# Patient Record
Sex: Male | Born: 1965
Health system: Southern US, Community
[De-identification: ages and names within clinical notes are randomized; demographics above are authoritative.]

## PROBLEM LIST (undated history)

## (undated) DIAGNOSIS — Z823 Family history of stroke: Secondary | ICD-10-CM

## (undated) DIAGNOSIS — I10 Essential (primary) hypertension: Secondary | ICD-10-CM

## (undated) DIAGNOSIS — C61 Malignant neoplasm of prostate: Secondary | ICD-10-CM

## (undated) DIAGNOSIS — R413 Other amnesia: Secondary | ICD-10-CM

## (undated) DIAGNOSIS — R519 Headache, unspecified: Secondary | ICD-10-CM

## (undated) DIAGNOSIS — K7689 Other specified diseases of liver: Secondary | ICD-10-CM

## (undated) DIAGNOSIS — I714 Abdominal aortic aneurysm, without rupture, unspecified: Secondary | ICD-10-CM

## (undated) DIAGNOSIS — M19019 Primary osteoarthritis, unspecified shoulder: Secondary | ICD-10-CM

## (undated) DIAGNOSIS — I493 Ventricular premature depolarization: Secondary | ICD-10-CM

## (undated) DIAGNOSIS — E119 Type 2 diabetes mellitus without complications: Secondary | ICD-10-CM

## (undated) DIAGNOSIS — E1165 Type 2 diabetes mellitus with hyperglycemia: Secondary | ICD-10-CM

## (undated) DIAGNOSIS — G479 Sleep disorder, unspecified: Secondary | ICD-10-CM

## (undated) DIAGNOSIS — N529 Male erectile dysfunction, unspecified: Secondary | ICD-10-CM

## (undated) DIAGNOSIS — R001 Bradycardia, unspecified: Secondary | ICD-10-CM

## (undated) DIAGNOSIS — F419 Anxiety disorder, unspecified: Secondary | ICD-10-CM

## (undated) DIAGNOSIS — E785 Hyperlipidemia, unspecified: Secondary | ICD-10-CM

## (undated) DIAGNOSIS — Z8673 Personal history of transient ischemic attack (TIA), and cerebral infarction without residual deficits: Secondary | ICD-10-CM

## (undated) DIAGNOSIS — G459 Transient cerebral ischemic attack, unspecified: Secondary | ICD-10-CM

## (undated) DIAGNOSIS — M1990 Unspecified osteoarthritis, unspecified site: Secondary | ICD-10-CM

## (undated) DIAGNOSIS — R51 Headache: Secondary | ICD-10-CM

## (undated) HISTORY — DX: Anxiety disorder, unspecified: F41.9

## (undated) HISTORY — DX: Essential (primary) hypertension: I10

## (undated) HISTORY — DX: Abdominal aortic aneurysm, without rupture: I71.4

## (undated) HISTORY — DX: Hyperlipidemia, unspecified: E78.5

## (undated) HISTORY — DX: Abdominal aortic aneurysm, without rupture, unspecified: I71.40

## (undated) HISTORY — DX: Family history of stroke: Z82.3

## (undated) HISTORY — DX: Transient cerebral ischemic attack, unspecified: G45.9

## (undated) HISTORY — DX: Headache, unspecified: R51.9

## (undated) HISTORY — PX: ROTATOR CUFF REPAIR: SHX139

## (undated) HISTORY — PX: MASS EXCISION: SHX2000

## (undated) HISTORY — DX: Ventricular premature depolarization: I49.3

## (undated) HISTORY — DX: Type 2 diabetes mellitus with hyperglycemia: E11.65

## (undated) HISTORY — DX: Type 2 diabetes mellitus without complications: E11.9

## (undated) HISTORY — DX: Bradycardia, unspecified: R00.1

## (undated) HISTORY — DX: Other amnesia: R41.3

## (undated) HISTORY — PX: HERNIA REPAIR: SHX51

## (undated) HISTORY — DX: Primary osteoarthritis, unspecified shoulder: M19.019

## (undated) HISTORY — DX: Personal history of transient ischemic attack (TIA), and cerebral infarction without residual deficits: Z86.73

## (undated) HISTORY — DX: Headache: R51

## (undated) HISTORY — DX: Male erectile dysfunction, unspecified: N52.9

## (undated) HISTORY — DX: Other specified diseases of liver: K76.89

## (undated) HISTORY — PX: SURGERY SCROTAL / TESTICULAR: SUR1316

---

## 1998-12-31 ENCOUNTER — Emergency Department (HOSPITAL_COMMUNITY): Admission: EM | Admit: 1998-12-31 | Discharge: 1998-12-31 | Payer: Self-pay

## 2001-08-10 ENCOUNTER — Encounter: Payer: Self-pay | Admitting: Emergency Medicine

## 2001-08-10 ENCOUNTER — Emergency Department (HOSPITAL_COMMUNITY): Admission: EM | Admit: 2001-08-10 | Discharge: 2001-08-10 | Payer: Self-pay | Admitting: Emergency Medicine

## 2002-02-07 ENCOUNTER — Ambulatory Visit (HOSPITAL_COMMUNITY): Admission: RE | Admit: 2002-02-07 | Discharge: 2002-02-07 | Payer: Self-pay | Admitting: General Surgery

## 2002-10-22 ENCOUNTER — Inpatient Hospital Stay (HOSPITAL_COMMUNITY): Admission: EM | Admit: 2002-10-22 | Discharge: 2002-10-24 | Payer: Self-pay | Admitting: Emergency Medicine

## 2004-05-31 ENCOUNTER — Emergency Department (HOSPITAL_COMMUNITY): Admission: EM | Admit: 2004-05-31 | Discharge: 2004-05-31 | Payer: Self-pay | Admitting: Emergency Medicine

## 2004-05-31 ENCOUNTER — Emergency Department (HOSPITAL_COMMUNITY): Admission: EM | Admit: 2004-05-31 | Discharge: 2004-06-02 | Payer: Self-pay | Admitting: Emergency Medicine

## 2005-04-23 ENCOUNTER — Emergency Department (HOSPITAL_COMMUNITY): Admission: EM | Admit: 2005-04-23 | Discharge: 2005-04-23 | Payer: Self-pay | Admitting: Emergency Medicine

## 2005-04-24 ENCOUNTER — Inpatient Hospital Stay (HOSPITAL_COMMUNITY): Admission: EM | Admit: 2005-04-24 | Discharge: 2005-04-26 | Payer: Self-pay | Admitting: Emergency Medicine

## 2005-04-25 ENCOUNTER — Encounter (INDEPENDENT_AMBULATORY_CARE_PROVIDER_SITE_OTHER): Payer: Self-pay | Admitting: Cardiology

## 2009-03-17 ENCOUNTER — Emergency Department (HOSPITAL_COMMUNITY): Admission: EM | Admit: 2009-03-17 | Discharge: 2009-03-17 | Payer: Self-pay | Admitting: Family Medicine

## 2010-11-11 NOTE — Op Note (Signed)
Jose Bishop, LEICHTER                         ACCOUNT NO.:  [id number]   MEDICAL RECORD NO.:  [id number]                   PATIENT TYPE:  AMB   LOCATION:  DAY                                  FACILITY:  [facility]   PHYSICIAN:  Timothy E. Earlene Plater, M.D.              DATE OF BIRTH:  [birth year]   DATE OF PROCEDURE:  02/07/2002  DATE OF DISCHARGE:  02/07/2002                                 OPERATIVE REPORT   PREOPERATIVE DIAGNOSIS:  Umbilical versus primary ventral hernia.   POSTOPERATIVE DIAGNOSIS:  Primary ventral hernia.   PROCEDURE:  Exploration of abdominal wall and repair of hernia incarcerated  with mesh.   SURGEON:  Timothy E. Earlene Plater, M.D.   ANESTHESIA:  General.   INDICATIONS:  The patient is 55, otherwise healthy, athletic and a very hard  worker who has developed a painful, apparently incarcerated hernia at and  just above the umbilicus.  After careful discussion, he wishes to proceed  with repair.   DESCRIPTION OF PROCEDURE:  The patient was brought to the operating room,  identified, permit signed, site marked, evaluated by anesthesia, taken to  the operating room and placed supine, general endotracheal anesthesia  administered.  The umbilicus was palpated, prepped and draped in the usual  fashion.  The hernia appeared to be above the umbilicus.  An incision was  made above the umbilicus and carried down through the deep subcutaneous  fatty tissue and the fascia identified.  The umbilicus was carefully  explored and though there was a tiny defect within the umbilicus that was  not the herniation.  Approximately 3 cm above the umbilicus was an  incarcerated primary ventral hernia.  The hernia contents were dissected out  from the subcutaneous tissue.  The hernia defect was widened horizontally  and the contents could then be reduced.  There was a hernia sac with a knot  of omental tissue.  The sac was opened, and the contents were then easily  reduced back into the  abdominal cavity.  The defect was then closed with  interrupted 0 sutures of  Prolene and the knots buried.  The tiny defect at the base of the umbilicus  was closed with a single suture of Prolene.  Fishing mesh was cut and  fashioned to fit this entire area and tacked down to the normal fascia with  2-0 PDS suture.  With the counts correct, and the wound dry it was closed in  layers with Monocryl.  Steri-Strips applied.  A small burn was accidentally  made no the skin just to the right and above the umbilicus.  This area was  button-holed out sharply and closed subcuticularly with a 3-0 Monocryl  suture.  Steri-Strips applied.  Dry sterile dressing applied.  He tolerated  it well and was removed to the recovery room in good condition.   Written and verbal instructions were given to him and his wife  including  Percocet 5 mg #36, and he will be seen and followed as an outpatient.                                               Timothy E. Earlene Plater, M.D.    TED/MEDQ  D:  02/07/2002  T:  02/09/2002  Job:  [id number]

## 2010-11-11 NOTE — Discharge Summary (Signed)
NAME:  Jose Bishop, Jose Bishop                          ACCOUNT NO.:  [id number]   MEDICAL RECORD NO.:  [id number]                   PATIENT TYPE:  INP   LOCATION:  5502                                 FACILITY:  [facility]   PHYSICIAN:  Lilyan Punt. Sydnee Levans, M.D.             DATE OF BIRTH:  Apr 28, 1966   DATE OF ADMISSION:  10/22/2002  DATE OF DISCHARGE:  10/24/2002                                 DISCHARGE SUMMARY   \   PRIMARY CARE PHYSICIAN:  Sigmund Hazel, M.D.   CONSULTING PHYSICIAN:  Otolaryngology, Dr. Kela Millin.   DISCHARGE DIAGNOSES:  1. Bilateral severe tonsillar swelling with peritonsillar cellulitis but no     evidence of abscess.  2. Reactive lymphadenitis.   DISCHARGE MEDICATIONS:  1. Ceftin 500 mg one b.i.d. for additional 12 days.  2. Decadron 10 mg q.8h. for an additional six doses.  3. Percocet 5/500 1-2 q.4-6h. p.r.n. pain.  4. Chloraseptic spray and lozenges p.r.n. pain.  5. Advil 600 mg q.6h. p.r.n.   FOLLOWUP:  Follow up with Dr. Hyacinth Meeker in 7-10 days [id number]) and with Dr.  Arletha Grippe, Otolaryngology as needed because of sudden worsening of throat  swelling or any breathing difficulty.   ACTIVITY:  No restrictions.  May return to work next Monday.   DIET:  No restrictions.  He is instructed on fluid intake, soft foods and  gradually advancing  diet as tolerated.   PAIN MANAGEMENT:  Current medications as above.   HOSPITAL COURSE:  This is a 45 year old male who presented to Dr. Rondel Baton  office with sore throat.  This was his first visit at that office.  Prior to  that, the patient was seen at Urgent Care facility diagnosed with viral  laryngitis and treated with Tri-pack.  He did not respond to therapy and saw  Dr. Hyacinth Meeker.  Dr. Hyacinth Meeker noted marked tonsillar swelling and tonsillar  exudate and marked lymphadenopathy and referred the patient for prompt CT of  the neck which revealed a concern about peritonsillar abscess.  In turn, he  went from [facility]  directly to the otolaryngologist's office.  CT and patient were examined closely.  There was no evidence of abscess and  no surgical procedure was deemed necessary.  The patient was in turn  referred for inpatient management.   After hospitalization, the patient received aggressive IV fluid, IV Decadron  and IV Rocephin and was serially assessed.  Dr. Arletha Grippe followed as well.  Cultures for group A strep were negative.  Mono-spot was negative.  CBC  was unremarkable.  The patient had stable hospital course and was discharged  on 4/30 in stable condition tolerating diet without any oral or airway  obstruction though he did have significant persisting jugulodigastric  lymphadenopathy in the left submandibular and left anterior cervical  regions.  Lilyan Punt Sydnee Levans, M.D.    KCS/MEDQ  D:  10/24/2002  T:  10/24/2002  Job:  [id number]

## 2010-11-11 NOTE — Consult Note (Signed)
NAMEJAKEN, FREGIA                ACCOUNT NO.:  [id number]   MEDICAL RECORD NO.:  [id number]          PATIENT TYPE:  INP   LOCATION:  6702                         FACILITY:  [facility]   PHYSICIAN:  Pramod P. Pearlean Brownie, MD    DATE OF BIRTH:  1966/02/11   DATE OF CONSULTATION:  DATE OF DISCHARGE:                                   CONSULTATION   REFERRING PHYSICIAN:  Jackie Plum, M.D.   REASON FOR REFERRAL:  TIAs.   HISTORY OF PRESENT ILLNESS:  Mr. Eddleman is a 45 year old African-American male  who developed symptoms of progressive left leg paresthesias, followed five  minutes later by left hand shaking as well as weakness, numbness and some  speech difficulties with word-finding problems.  His symptoms lasted a  couple of hours.  They were accompanied by headache, which he describes as  left hemicranial throbbing, sharp, moderate to severe intensity, not  accompanied by nausea or vomiting, photophobia or visual symptoms.  No  history of migraine headaches.  Admits to intermittent headaches which are  not very disabling and analgesics seem to help.  He has no known prior  history of definite TIAs or stroke, though he did have an episode of right  upper extremity weakness and numbness in December 2005.  At that time an MRI  scan was done at [facility], which was normal.  He was  subsequently found by his primary physician, Vikki Ports, M.D., to  have some right leg weakness as well; however, no source of etiology was  found.  MRI of the C-spine was also subsequently done, which was  unremarkable, though the actual results are not available for my opinion  today.  The patient has no known vascular risk factors except hyperlipidemia  and family history of stroke from his father.   PAST MEDICAL HISTORY:  Otherwise unremarkable.   PAST SURGICAL HISTORY:  Hernia surgery, testicular fixation surgery.   MEDICATIONS AT HOME:  Ibuprofen.   MEDICATION ALLERGIES:  None.   SOCIAL HISTORY:  The patient works as a Naval architect.  He does not smoke.  He drinks a few beers.  He denies doing drugs.   REVIEW OF SYSTEMS:  Not significant for recent fever, cough, chest pain,  diarrhea, shortness of breath.   PHYSICAL EXAMINATION:  GENERAL:  A well-built young African-American male  who is not in distress.  VITAL SIGNS:  Afebrile, pulse is 72 per minute and regular, respiratory 16  per minute; distal pulses are well felt.  HEENT:  Head is nontraumatic.  ENT exam unremarkable.  NECK:  Supple without bruit.  CARDIAC:  No murmur or gallop.  CHEST:  Lungs clear to auscultation.  NEUROLOGIC:  The patient is pleasant, awake, alert, cooperative.  There is  no aphasia, apraxia or dysarthria.  He does complain of subjective memory  loss but he seems to have well-preserved attention and short-term memory.  Detailed cognitive testing was not done today.  Pupils are equal, reactive  to light and accommodation.  Visual acuity and fields adequate.  Face is  symmetric, bilateral movements are normal, tongue  is midline.  Motor system  exam reveals no upper extremity drift.  Fine finger movements are symmetric  and does nor orbit right over left upper extremity.  He has some subjective  give-away weakness while testing for left grip strength.  The rest of the  strength testing was symmetric and equal.  He is slightly unsteady when  standing on the left foot not supported and while trying to walk tandem can  walk fairly well.  Deep tendon reflexes are 2+, symmetric.  Plantars are  downgoing.  There is subjective decreased sensation on the left side of the  body, but this is not really consistent.   DATA REVIEWED:  MRI scan of the brain done yesterday is normal.  MRA of the  brain reveals no significant stenosis.  Carotid artery film shows no  stenosis.  Previous brain MRI scan from December 2005:  Results are normal.   IMPRESSION:  A 45 year old male with no significant vascular  risk factors  except hyperlipidemia and a family history of stroke, presents with  progressive paresthesias and weakness of the left body as well as some  speech difficulties, which are ill-defined and of unclear etiology.  Transient ischemic attack or stroke would be unlikely given the progression  of the symptoms.  Complicated migraine is certainly a possibility given the  subjective headache, although he has no prior history of migraines.  Focal  seizures would also be a possibility given the fact that he had some  twitchings of his left arm.  He had some subjective complaints of short-term  memory loss which may need further evaluation as an outpatient.   PLAN:  I would recommend checking an EEG.  He may have continuing outpatient  follow-up with Dr. Thad Ranger for further workup if necessary.  I had a long  discussion with the patient and his mother and answered questions.           ______________________________  Sunny Schlein. Pearlean Brownie, MD     PPS/MEDQ  D:  04/25/2005  T:  04/26/2005  Job:  [id number]

## 2010-11-11 NOTE — Discharge Summary (Signed)
Jose Bishop, Jose Bishop                ACCOUNT NO.:  [id number]   MEDICAL RECORD NO.:  [id number]          PATIENT TYPE:  INP   LOCATION:  6702                         FACILITY:  [facility]   PHYSICIAN:  Jackie Plum, M.D.DATE OF BIRTH:  1965/12/11   DATE OF ADMISSION:  04/24/2005  DATE OF DISCHARGE:                           DISCHARGE SUMMARY - REFERRING   DISCHARGE DIAGNOSES:  1.  Transient left body paresthesias and weakness of unknown etiology.      1.  CT scan of the head was negative.  MRI and MRA of the brain was also          unremarkable.  EEG as well as antiphospholipid antibodies ANA and          ESR pending, will need to be checked and ANA pending.  Patient's ESR          was normal at 8.  HIV testing as nonreactive.  2.  History of dyslipidemia.  3.  History of prostate enlargement.  4.  History of chronic back pain.   DISCHARGE MEDICATIONS:  1.  Patient will be discharged on aspirin 325 mg daily.  2.  Zocor 20 mg nightly.   DISCHARGE LABORATORY DATA:  White blood cell count 5.4, hemoglobin 15.2,  hematocrit 45.4, MCV 92.5, platelet count 297.  ESR 9.  INR 1.0, PTT 28,  protime 13.3.  Sodium 133, potassium 3.9, chloride 104, CO2 26, glucose 104,  BUN 11, creatinine 1.2, bilirubin 0.6, alkaline phosphatase 49, AST 23, ALT  21, total protein 6.7, albumin 3.7, calcium 9.0.  PSA 1.82.   CONSULTATION:  Pramod P. Pearlean Brownie, M.D.   PROCEDURE:  Patient is awaiting EEG today past discharge.  He also had MRI  and MRA with CT scan as noted above.   REASON FOR ADMISSION:  Left-sided weakness.   Patient is a 44 year old African American gentleman who presented with  progressive left lower extremity as well as left upper extremity shaking  followed by numbness and weakness with some speech difficulty and trouble  with finding his words.  Episode _________ lasted about two hours and  resolved completely in the ER.  Patient has had episode of headaches prior  to the onset of his  symptoms.  He does not have any history of migraine  headaches.  According to H&P by Dr. Hollice Espy, patient's vital  signs were stable and neurologic testing was notable for weakness on the  left side.  He was therefore admitted for further evaluation.   On admission, patient was put on telemetry bed.  There were no significant  arrhythmias.  Above, neurological imaging studies were done, all of which  were negative.  Patient was seen by Dr. Sunny Schlein. Pearlean Brownie and his full consult  report, dictation number [id number], was not available at the time of this  dictation.  However, he questioned possible migraine equivalence with  headache association.  He recommended EEG to be completed, though he doubted  that patient had any seizure activity.  Patient had seen Dr. Thad Ranger.  He  asked that antiphospholipid antibodies and ANA as well as ESR  be done and  patient follow up with his primary neurologist in the office.  The patient  is not having complaints, he is doing fine, no neurologic symptoms, vital  signs are stable and his ESR results were within normal limits,  antiphospholipid antibodies as well as ANA and EEG is pending.  He is being  planned for discharge today to follow up expeditiously with his primary  neurologist, Dr. Thad Ranger, in the next few days or so on discharge.  This  discharge was coordinated with Dr. Janalyn Shy P. Sethi.  He will be discharged  home on new medications of Zocor.  Patient has history of dyslipidemia but  has not been on any medication and unfortunately, fasting lipid panel which  I had asked for, was not completed.  The patient is in stable satisfactory  condition.  He understands that he should notify his PCP, Dr. Theresia Lo, or  his neurologist, Dr. Thad Ranger, if he has any other problems including his  neurologic symptoms.      Jackie Plum, M.D.  Electronically Signed     GO/MEDQ  D:  04/26/2005  T:  04/26/2005  Job:  [id number]   cc:   Pramod P.  Pearlean Brownie, MD  Fax: [phone]   Marolyn Hammock. Thad Ranger, M.D.  Fax: [phone]   Vikki Ports, M.D.  Fax: [phone]

## 2010-11-11 NOTE — H&P (Signed)
Jose Bishop, Jose Bishop                ACCOUNT NO.:  [id number]   MEDICAL RECORD NO.:  [id number]          PATIENT TYPE:  INP   LOCATION:  1826                         FACILITY:  [facility]   PHYSICIAN:  Hollice Espy, M.D.DATE OF BIRTH:  January 29, 1966   DATE OF ADMISSION:  04/24/2005  DATE OF DISCHARGE:                                HISTORY & PHYSICAL   CONSULTANTS ON THIS CASE:  [name]   PRIMARY CARE PHYSICIAN:  Dr. Lanell Persons.   CHIEF COMPLAINT:  Left-sided weakness.   The patient is a 45 year old African American male with past medical history  of hyperlipidemia and a previous episode one year ago of sudden onset right  upper extremity weakness of unknown etiology which lasted for approximately  two weeks with full resolution. The patient's medical history is otherwise  unremarkable. He is not on any medications. He exercises regularly, although  he does intermittently over the past year he has had episodes with  forgetfulness. He has been doing well, although his workup for this initial  right upper extremity weakness was unremarkable and reportedly, according  neurology and PCP records, had an episode approximately one month later of  right lower extremity weakness, although the patient himself did not mention  this. Medical-wise, he has been relatively healthy. He says no one else has  been ill. He has not had any recent trips or vacations. No strange food. No  recent fever or chills, but then starting yesterday morning woke up and had  sudden onset abdominal pain leading to diarrhea. This started at  approximately 6 a.m. The patient had some mild nausea, but no vomiting with  this. The diarrhea was noted to be light colored in nature. He noted what he  thought might have been flecks of red blood. Approximately two hours later  he had sudden onset of severe left-sided weakness which included the upper  extremity, lower extremity, and drooping of the left side of  the face. The  patient denied any vision loss, but felt that his mouth was turned and he  had some drooling. In addition he could barely move his arm or leg to the  point that he had to have his wife come in to support him and carry him back  to the bedroom. These symptoms persisted and he became quite concerned and  came into the emergency room for further evaluation. The patient underwent a  CT scan of the head which was completely unremarkable. The symptoms began to  slightly improve and he was sent home. He was then followed up with PCP, Dr.  Theresia Lo, who had the patient come into his office. Further labwork was  ordered, which reportedly was unremarkable as well, although I do not have  those labs with me now.  Dr. Theresia Lo then contacted [facility] hospitalist who  felt that the patient needed further evaluation and workup. He came into the  emergency room as there were no direct beds available. An MRI was ordered  which reportedly was complete, although the patient's IV infiltrated, but  the preliminary report on the MRI and MRA  has been negative. In addition,  labs that were done at the time of the patient's ER visit included a urine  drug screen which was completely normal, a urinalysis that was completely  normal, an alcohol level less than 5, a lipase level of 18, a CBC with  differential that was noted only for an 84% neutrophil count, but otherwise  unremarkable and a CMET which was essentially unremarkable as well.  Currently, the patient states he is feeling  okay. He says  he still  complains of feeling quite weak on his left side, although he says his  movement is a little bit more strong than they were one day prior. His  diarrhea has since resolved. It looks as if he received some Imodium as an  outpatient for this and he has not had a bowel movement since, although he  still complains of some mild abdominal discomfort, minimal compared to how  it was one day prior. He  complains of significant fatigue and is quite  tired. He denies any headache, no vision changes, no trouble swallowing. He  had an episode where liquid went down the wrong pipe as he describes it one  week ago, but has had no problems with dysphagia or choking since then. He  denies any chest pain, palpitations, shortness of breath, wheezing, and  coughing. He denies any hematuria or dysuria. He denies any constipation. He  does complain of some left-sided numbness as well, along with the weakness.  Again, this is all new.   REVIEW OF SYSTEMS:  Otherwise negative.   PAST MEDICAL HISTORY:  1.  History of reported prostate enlargement requiring medication briefly.  2.  History of testicular fixation. He may have had an extracted testicle      when he was young. Details on this are not 100% known.  3.  Previous history one year ago of right upper extremity weakness of      unknown etiology, lasting two weeks.  4.  Problems with chronic back pain usually in the lower back. The patient      has had no previous episodes of bladder or bowel lack of control.  5.  History of  hyperlipidemia.   MEDICATIONS:  The patient does not any medications. He denies use of any  herbal substances, [employer] or otherwise.   Following his abdominal, diarrhea, and symptoms yesterday, his girlfriend  gave him one garlic tablet, but that is the first time he has taken that.   ALLERGIES:  He has no known drug allergies.   SOCIAL HISTORY:  He denies any drug use or tobacco use of any kind. He does  admit to drinking a large bottle of beer usually every other night and  occasionally two or three cans, again not every night.   FAMILY HISTORY:  Father reportedly had two CVAs and CAD. He does not know  the extent of history by the father and only found this out as of late.  Mother's history family's side is unremarkable. No evidence of any history  of any cancer of any kind.  PHYSICAL EXAMINATION:  VITAL SIGNS: On  admission, temperature of 97.6, heart  rate 50, blood pressure 129/79, respirations 20, O2 saturation 96% on room  air.  GENERAL: The patient appears to be alert and oriented, slightly tired with  appropriate speech. His wife tells me that he had difficulty speaking  yesterday secondary to his drooping mouth, but did not ever show episodes of  confusion, no disorientation,  and responded appropriately to questions.  HEENT: Normocephalic and atraumatic. His mucous membranes are slightly dry.  He has no carotid bruits. On cranial nerve exam he is noted to have some  difficulty, it appears, raising his bilateral eyebrows. He has facial  numbness subjectively on the left side. The branch of 5/1 involving the  forehead, but not of the jaw or maxilla. He has no fasciculations of his  tongue. He is able to swallow and chew. His smile appears to be slightly  crooked on the left side, although it is not significantly pronounced. He  has no evidence of any cranial nerve 11 deficit.  HEART: Regular rhythm, soft with mild bradycardia.  LUNGS: Clear to auscultation bilaterally.  ABDOMEN: Soft, slightly distended, minimally tender, but diffuse and not  focally pronounced.  EXTREMITIES: No clubbing, cyanosis, or edema. He has good 2+ pulses.  MUSCULOSKELETAL: The patient is noted to have weakness on the left side. He  is about a 5/5 on upper extremity and lower extremity. On the right hand  side, he has 5/5 in the grip, extension and flexion at the elbow, the hand,  the knee, and the foot as well. Gross and fine motor control appear to be  slightly impaired on the left hand side with approximately a 4+ to 5- on  grip, flexion/extension of the arm and lower extremity of the knee and  ankle. He also has complaints of numbness and tingling on both the arm and  more pronounced on the leg. No evidence of any Babinski sign on either,  however he does feel decreased sensation on that foot. In addition, fine   motor control such as dysdiadochokinesis and finger tapping is affected with  coordination skills on the left hand side. No evidence of any nystagmus.   Labwork, as mentioned as per HPI, and we have ordered a full scale of labs  which are not yet drawn.   ASSESSMENT/PLAN:  1.  Sudden onset of left-sided weakness possibly associated with abdominal      infection, diarrhea.  It is quite unclear at this point what exactly is      going on. He did not appear to have had an acute stroke, although he had      some sort of neurologic impairment. Location too also appears to be in      question he has both left-sided facial symptoms as well as left-sided      weakness. It is difficult to say if all of it is completely subjective,      but would certainly need to rule out further etiology. I have ordered an      EKG, cardiac enzymes, troponins, CBC with diff, repeat peripheral smear,      B12, folate, iron studies, iron levels, ionized calcium, phosphorus, HIV     titer, ANA, ABG on room air, stool cultures for ova and parasite, PT and      PTT, INR, TSH, and a CMET to rule out different etiologies including      possible HIV. Lupus, hypoxia, lead poisoning, microcytic or macrocytic      anemia, alcohol abuse, electrolyte deficiency including      hyperparathyroidism, liver function abnormalities, and vitamin      deficiencies as well. Given the patient's previous history of a possible      enlarged prostate, obviously there is  some concern at her young age of      prostate cancer. I am ordering a PSA, although currently  he has had no      urinary difficulties. In regards to the patient's history of diarrhea,      which may be related to an infection, monitoring stool culture, ova and      parasite, I do not think that this an associated infection with      neurological disorder such as Guillain-Barre given that it is more an      ascending paralysis and this is sudden onset left-sided, both  weakness      of sensation and dysfunction. Also ordering an cervical MRI, carotid      Dopplers, and a 2-D echocardiogram. If possible the patient may have      generated clots which have gone to the abdomen as well as to the head.      Again, it is quite unlikely given the normal MRI and CT of the head.      This possibly may be psychiatric, but certainly given his young age I do      not want to take a chance that this may be some other disorder, even      possibly malignancy related until we have completely ruled out other      etiologies.      Hollice Espy, M.D.  Electronically Signed     SKK/MEDQ  D:  04/24/2005  T:  04/24/2005  Job:  [id number]   cc:   Vikki Ports, M.D.  Fax: [phone]   Marolyn Hammock. Thad Ranger, M.D.  Fax: [phone]

## 2010-11-11 NOTE — Procedures (Signed)
CLINICAL HISTORY:  The patient is a 45 year old with episode of numbness in  left side and slurred speech 4 days ago.  The same episode happened a year  ago on the right.  Study is being done to look for the presence of seizures.   PROCEDURE:  The tracing was carried out on a 32-channel digital Cadwell  recorder reformatted into 16-channel montages with 1 devoted to EKG.  The  patient was awake and asleep during the recording.  The International 10/20  System of lead placement was used.   DESCRIPTION OF FINDINGS:  Dominant frequency is an 11-Hz 15- to 25-microvolt  activity that is well-regulated and attenuates partially with eye opening.   Background activity is a mixture of predominantly beta range components with  a very low-voltage alpha range superimposed.   The patient becomes drowsy with mixed-frequency 3- to 4-Hz centrally and  posteriorly predominant delta range activity and frontal beta range  activity.  This activity was only of 25 microvolts.  The patient drifts into  natural sleep with vertex sharp waves, desynchronized background of  predominantly delta range activity and symmetric and synchronous sleep  spindles.   The patient was aroused.  Intermittent photic stimulation induced a driving  response of 9 and 11 Hz.  Hyperventilation caused further arousal, but no  other changes.   IMPRESSION:  Normal record with the patient awake, drowsy and asleep.      Deanna Artis. Sharene Skeans, M.D.  Electronically Signed     ZOX:[id number]  D:  04/26/2005 15:33:47  T:  04/27/2005 45:40:98  Job #:  [id number]   cc:   Hollice Espy, M.D.   Pramod P. Pearlean Brownie, MD  Fax: [phone]

## 2014-03-22 ENCOUNTER — Emergency Department: Payer: Self-pay | Admitting: Emergency Medicine

## 2014-06-16 ENCOUNTER — Encounter: Payer: BC Managed Care – PPO | Attending: *Deleted

## 2014-06-23 ENCOUNTER — Ambulatory Visit: Payer: Self-pay

## 2014-06-30 ENCOUNTER — Ambulatory Visit: Payer: Self-pay

## 2014-09-23 ENCOUNTER — Encounter: Payer: Self-pay | Admitting: Neurology

## 2014-10-02 ENCOUNTER — Ambulatory Visit (INDEPENDENT_AMBULATORY_CARE_PROVIDER_SITE_OTHER): Payer: 59 | Admitting: Neurology

## 2014-10-02 ENCOUNTER — Encounter: Payer: Self-pay | Admitting: Neurology

## 2014-10-02 VITALS — BP 140/84 | HR 60 | Temp 98.0°F | Resp 18 | Ht 75.0 in | Wt 265.5 lb

## 2014-10-02 DIAGNOSIS — Z8249 Family history of ischemic heart disease and other diseases of the circulatory system: Secondary | ICD-10-CM | POA: Insufficient documentation

## 2014-10-02 DIAGNOSIS — Z8673 Personal history of transient ischemic attack (TIA), and cerebral infarction without residual deficits: Secondary | ICD-10-CM

## 2014-10-02 DIAGNOSIS — G4485 Primary stabbing headache: Secondary | ICD-10-CM | POA: Insufficient documentation

## 2014-10-02 LAB — BUN: BUN: 13 mg/dL (ref 6–23)

## 2014-10-02 LAB — CREATININE, SERUM: Creat: 1.03 mg/dL (ref 0.50–1.35)

## 2014-10-02 NOTE — Progress Notes (Signed)
NEUROLOGY CONSULTATION NOTE  ALVERTO SHEDD MRN: [id number] DOB: 12-25-1965  Referring provider: Shanon Rosser, PA Primary care provider: Everardo Beals, MD  Reason for consult:  headache  HISTORY OF PRESENT ILLNESS: Jose Bishop is a 49 year old right-handed man with type 2 diabetes and history of TIA who presents for headache.  Records, labs and previous MRI and MRA of head reviewed.  About 2 weeks ago, he began experiencing brief stabbing pains on his crown.  It would last 1 to 2 minutes.  Last week, it occurred several times a day, but this week it has occurred about every 3 days.  There is also allodynia, numbness and tenderness to touch at the crown.  There are no associated symptoms.  He denies neck pain.  He does not take anything for it.  He has no history of headache.  He has remote history of 2 TIAs which occurred several years ago.  He presented both times with left facial weakness and left sided numbness.  One of those episodes occurred in October 2006.  MRI and MRA of the head were reviewed and were unremarkable.  He also has a family history of cerebral aneurysm.  His mother, uncle and niece have such a history.  PAST MEDICAL HISTORY: Past Medical History  Diagnosis Date  . TIA (transient ischemic attack)   . Diabetes   . Memory loss   . Headache     PAST SURGICAL HISTORY: Past Surgical History  Procedure Laterality Date  . Hernia repair      MEDICATIONS: Current Outpatient Prescriptions on File Prior to Visit  Medication Sig Dispense Refill  . amoxicillin-clavulanate (AUGMENTIN) 875-125 MG per tablet Take 1 tablet by mouth 2 (two) times daily.    Marland Kitchen levofloxacin (LEVAQUIN) 750 MG tablet Take 750 mg by mouth daily.    . metFORMIN (GLUCOPHAGE) 500 MG tablet Take by mouth 2 (two) times daily with a meal.    . traMADol (ULTRAM) 50 MG tablet Take by mouth every 6 (six) hours as needed.     No current facility-administered medications on file prior to visit.     ALLERGIES: No Known Allergies  FAMILY HISTORY: Family History  Problem Relation Age of Onset  . Anuerysm Mother   . Stroke Mother   . Hypertension Father   . Peripheral vascular disease Father   . Diabetes Sister   . Heart murmur Sister     SOCIAL HISTORY: History   Social History  . Marital Status: Married    Spouse Name: N/A  . Number of Children: N/A  . Years of Education: N/A   Occupational History  . Not on file.   Social History Main Topics  . Smoking status: Never Smoker   . Smokeless tobacco: Never Used  . Alcohol Use: 0.0 oz/week    0 Standard drinks or equivalent per week  . Drug Use: No  . Sexual Activity:    Partners: Female   Other Topics Concern  . Not on file   Social History Narrative    REVIEW OF SYSTEMS: Constitutional: No fevers, chills, or sweats, no generalized fatigue, change in appetite Eyes: No visual changes, double vision, eye pain Ear, nose and throat: No hearing loss, ear pain, nasal congestion, sore throat Cardiovascular: No chest pain, palpitations Respiratory:  No shortness of breath at rest or with exertion, wheezes GastrointestinaI: No nausea, vomiting, diarrhea, abdominal pain, fecal incontinence Genitourinary:  No dysuria, urinary retention or frequency Musculoskeletal:  No neck pain, back pain  Integumentary: No rash, pruritus, skin lesions Neurological: as above Psychiatric: No depression, insomnia, anxiety Endocrine: No palpitations, fatigue, diaphoresis, mood swings, change in appetite, change in weight, increased thirst Hematologic/Lymphatic:  No anemia, purpura, petechiae. Allergic/Immunologic: no itchy/runny eyes, nasal congestion, recent allergic reactions, rashes  PHYSICAL EXAM: Filed Vitals:   10/02/14 1300  BP: 140/84  Pulse: 60  Temp: 98 F (36.7 C)  Resp: 18   General: No acute distress Head:  Normocephalic/atraumatic Eyes:  fundi unremarkable, without vessel changes, exudates, hemorrhages or  papilledema. Neck: supple, no paraspinal tenderness, full range of motion Back: No paraspinal tenderness Heart: regular rate and rhythm Lungs: Clear to auscultation bilaterally. Vascular: No carotid bruits. Neurological Exam: Mental status: alert and oriented to person, place, and time, recent and remote memory intact, fund of knowledge intact, attention and concentration intact, speech fluent and not dysarthric, language intact. Cranial nerves: CN I: not tested CN II: pupils equal, round and reactive to light, visual fields intact, fundi unremarkable, without vessel changes, exudates, hemorrhages or papilledema. CN III, IV, VI:  full range of motion, no nystagmus, no ptosis CN V: facial sensation intact CN VII: upper and lower face symmetric CN VIII: hearing intact CN IX, X: gag intact, uvula midline CN XI: sternocleidomastoid and trapezius muscles intact CN XII: tongue midline Bulk & Tone: normal, no fasciculations. Motor:  5/5 throughout Sensation:  Temperature and vibration intact Deep Tendon Reflexes:  2+ throughout, toes downgoing Finger to nose testing:  No dysmetria  Heel to shin:  No dysmetria Gait:  Normal station and stride.  Able to turn and walk in tandem. Romberg negative.  IMPRESSION: Stabbing headache.  Possibly primary stabbing headache.  Occipital neuralgia is a possibility except he has no tenderness to palpation at the occipital notches.   History of TIAs Family history of cerebral aneurysm.  PLAN: 1.  To rule out concerning etiologies, will get MRI and MRA of head 2.  They are manageable and improving, so we won't start a medication.  We can consider gabapentin if needed 3.  Follow up pending results  Thank you for allowing me to take part in the care of this patient.  Metta Clines, DO  CC:  Everardo Beals, MD  Shanon Rosser, [place]

## 2014-10-02 NOTE — Patient Instructions (Addendum)
1.  We will check MRI and MRA of head April  26 [facility] 4:45 pm  2.  Call if the stabbings get worse and you need something.

## 2014-10-20 ENCOUNTER — Ambulatory Visit (HOSPITAL_COMMUNITY): Admission: RE | Admit: 2014-10-20 | Payer: 59 | Source: Ambulatory Visit

## 2014-11-16 ENCOUNTER — Emergency Department (HOSPITAL_COMMUNITY): Payer: 59

## 2014-11-16 ENCOUNTER — Encounter (HOSPITAL_COMMUNITY): Payer: Self-pay | Admitting: Family Medicine

## 2014-11-16 ENCOUNTER — Emergency Department (HOSPITAL_COMMUNITY)
Admission: EM | Admit: 2014-11-16 | Discharge: 2014-11-16 | Disposition: A | Discharge status: Home / Self Care | Payer: 59 | Attending: Emergency Medicine | Admitting: Emergency Medicine

## 2014-11-16 DIAGNOSIS — X58XXXA Exposure to other specified factors, initial encounter: Secondary | ICD-10-CM | POA: Insufficient documentation

## 2014-11-16 DIAGNOSIS — Z8673 Personal history of transient ischemic attack (TIA), and cerebral infarction without residual deficits: Secondary | ICD-10-CM | POA: Insufficient documentation

## 2014-11-16 DIAGNOSIS — R0602 Shortness of breath: Secondary | ICD-10-CM | POA: Insufficient documentation

## 2014-11-16 DIAGNOSIS — R42 Dizziness and giddiness: Secondary | ICD-10-CM

## 2014-11-16 DIAGNOSIS — Y9389 Activity, other specified: Secondary | ICD-10-CM | POA: Insufficient documentation

## 2014-11-16 DIAGNOSIS — R079 Chest pain, unspecified: Secondary | ICD-10-CM

## 2014-11-16 DIAGNOSIS — Z79899 Other long term (current) drug therapy: Secondary | ICD-10-CM | POA: Insufficient documentation

## 2014-11-16 DIAGNOSIS — Z8701 Personal history of pneumonia (recurrent): Secondary | ICD-10-CM | POA: Insufficient documentation

## 2014-11-16 DIAGNOSIS — M6289 Other specified disorders of muscle: Secondary | ICD-10-CM

## 2014-11-16 DIAGNOSIS — Y998 Other external cause status: Secondary | ICD-10-CM | POA: Insufficient documentation

## 2014-11-16 DIAGNOSIS — R200 Anesthesia of skin: Secondary | ICD-10-CM | POA: Diagnosis not present

## 2014-11-16 DIAGNOSIS — S29011A Strain of muscle and tendon of front wall of thorax, initial encounter: Secondary | ICD-10-CM | POA: Diagnosis not present

## 2014-11-16 DIAGNOSIS — S299XXA Unspecified injury of thorax, initial encounter: Secondary | ICD-10-CM | POA: Diagnosis present

## 2014-11-16 DIAGNOSIS — R531 Weakness: Secondary | ICD-10-CM

## 2014-11-16 DIAGNOSIS — Z792 Long term (current) use of antibiotics: Secondary | ICD-10-CM | POA: Diagnosis not present

## 2014-11-16 DIAGNOSIS — E119 Type 2 diabetes mellitus without complications: Secondary | ICD-10-CM | POA: Insufficient documentation

## 2014-11-16 DIAGNOSIS — Y9289 Other specified places as the place of occurrence of the external cause: Secondary | ICD-10-CM | POA: Insufficient documentation

## 2014-11-16 LAB — URINALYSIS, ROUTINE W REFLEX MICROSCOPIC
Bilirubin Urine: NEGATIVE
Glucose, UA: NEGATIVE mg/dL
Hgb urine dipstick: NEGATIVE
KETONES UR: NEGATIVE mg/dL
Leukocytes, UA: NEGATIVE
Nitrite: NEGATIVE
Protein, ur: NEGATIVE mg/dL
Specific Gravity, Urine: 1.017 (ref 1.005–1.030)
Urobilinogen, UA: 0.2 mg/dL (ref 0.0–1.0)
pH: 5.5 (ref 5.0–8.0)

## 2014-11-16 LAB — COMPREHENSIVE METABOLIC PANEL
ALT: 22 U/L (ref 17–63)
AST: 23 U/L (ref 15–41)
Albumin: 3.8 g/dL (ref 3.5–5.0)
Alkaline Phosphatase: 41 U/L (ref 38–126)
Anion gap: 12 (ref 5–15)
BUN: 15 mg/dL (ref 6–20)
CO2: 21 mmol/L — ABNORMAL LOW (ref 22–32)
Calcium: 8.9 mg/dL (ref 8.9–10.3)
Chloride: 100 mmol/L — ABNORMAL LOW (ref 101–111)
Creatinine, Ser: 1.17 mg/dL (ref 0.61–1.24)
GFR calc Af Amer: >60 mL/min (ref >60)
GFR calc non Af Amer: >60 mL/min (ref >60)
Glucose, Bld: 167 mg/dL — ABNORMAL HIGH (ref 65–99)
Potassium: 3.9 mmol/L (ref 3.5–5.1)
Sodium: 133 mmol/L — ABNORMAL LOW (ref 135–145)
Total Bilirubin: 0.9 mg/dL (ref 0.3–1.2)
Total Protein: 7.2 g/dL (ref 6.5–8.1)

## 2014-11-16 LAB — CBC
HEMATOCRIT: 44.6 % (ref 39.0–52.0)
HEMOGLOBIN: 14.9 g/dL (ref 13.0–17.0)
MCH: 30.2 pg (ref 26.0–34.0)
MCHC: 33.4 g/dL (ref 30.0–36.0)
MCV: 90.3 fL (ref 78.0–100.0)
Platelets: 247 10*3/uL (ref 150–400)
RBC: 4.94 MIL/uL (ref 4.22–5.81)
RDW: 13.7 % (ref 11.5–15.5)
WBC: 5.1 10*3/uL (ref 4.0–10.5)

## 2014-11-16 LAB — DIFFERENTIAL
Basophils Absolute: 0 10*3/uL (ref 0.0–0.1)
Basophils Relative: 1 % (ref 0–1)
Eosinophils Absolute: 0.4 10*3/uL (ref 0.0–0.7)
Eosinophils Relative: 7 % — ABNORMAL HIGH (ref 0–5)
Lymphocytes Relative: 48 % — ABNORMAL HIGH (ref 12–46)
Lymphs Abs: 2.5 10*3/uL (ref 0.7–4.0)
Monocytes Absolute: 0.5 10*3/uL (ref 0.1–1.0)
Monocytes Relative: 10 % (ref 3–12)
Neutro Abs: 1.7 10*3/uL (ref 1.7–7.7)
Neutrophils Relative %: 34 % — ABNORMAL LOW (ref 43–77)

## 2014-11-16 LAB — I-STAT TROPONIN, ED
TROPONIN I, POC: 0 ng/mL (ref 0.00–0.08)
Troponin i, poc: 0 ng/mL (ref 0.00–0.08)

## 2014-11-16 LAB — I-STAT CHEM 8, ED
BUN: 17 mg/dL (ref 6–20)
CHLORIDE: 103 mmol/L (ref 101–111)
Calcium, Ion: 1.15 mmol/L (ref 1.12–1.23)
Creatinine, Ser: 1.1 mg/dL (ref 0.61–1.24)
Glucose, Bld: 170 mg/dL — ABNORMAL HIGH (ref 65–99)
HEMATOCRIT: 48 % (ref 39.0–52.0)
Hemoglobin: 16.3 g/dL (ref 13.0–17.0)
Potassium: 3.9 mmol/L (ref 3.5–5.1)
Sodium: 135 mmol/L (ref 135–145)
TCO2: 19 mmol/L (ref 0–100)

## 2014-11-16 LAB — PROTIME-INR
INR: 1.06 (ref 0.00–1.49)
Prothrombin Time: 14 seconds (ref 11.6–15.2)

## 2014-11-16 LAB — ETHANOL

## 2014-11-16 LAB — RAPID URINE DRUG SCREEN, HOSP PERFORMED
Amphetamines: NOT DETECTED
Barbiturates: NOT DETECTED
Benzodiazepines: NOT DETECTED
Cocaine: NOT DETECTED
Opiates: NOT DETECTED
Tetrahydrocannabinol: NOT DETECTED

## 2014-11-16 LAB — APTT: aPTT: 28 seconds (ref 24–37)

## 2014-11-16 MED ORDER — METOCLOPRAMIDE HCL 5 MG/ML IJ SOLN
10.0000 mg | Freq: Once | INTRAMUSCULAR | Status: AC
  Administered 2014-11-16: 10 mg via INTRAVENOUS
  Filled 2014-11-16: qty 2

## 2014-11-16 MED ORDER — IBUPROFEN 800 MG PO TABS
800.0000 mg | ORAL_TABLET | Freq: Three times a day (TID) | ORAL | Status: DC
Start: 2014-11-16 — End: 2015-01-05

## 2014-11-16 MED ORDER — HYDROCHLOROTHIAZIDE 12.5 MG PO TABS: 12.5000 mg | ORAL_TABLET | Freq: Every day | ORAL | Status: DC

## 2014-11-16 MED ORDER — DIPHENHYDRAMINE HCL 50 MG/ML IJ SOLN
25.0000 mg | Freq: Once | INTRAMUSCULAR | Status: AC
  Administered 2014-11-16: 25 mg via INTRAVENOUS
  Filled 2014-11-16: qty 1

## 2014-11-16 MED ORDER — CYCLOBENZAPRINE HCL 5 MG PO TABS: 5.0000 mg | ORAL_TABLET | Freq: Two times a day (BID) | ORAL | Status: DC | PRN

## 2014-11-16 MED ORDER — HYDROCHLOROTHIAZIDE 12.5 MG PO CAPS
12.5000 mg | ORAL_CAPSULE | Freq: Once | ORAL | Status: AC
  Administered 2014-11-16: 12.5 mg via ORAL
  Filled 2014-11-16: qty 1

## 2014-11-16 MED ORDER — SODIUM CHLORIDE 0.9 % IV BOLUS (SEPSIS)
500.0000 mL | Freq: Once | INTRAVENOUS | Status: AC
  Administered 2014-11-16: 500 mL via INTRAVENOUS

## 2014-11-16 NOTE — ED Notes (Signed)
Pt stat to CT

## 2014-11-16 NOTE — Discharge Instructions (Signed)
Take ASA 81 mg daily.   Take motrin for pain.   Take HCTZ as prescribed.   Take flexeril for muscle spasms.   Rest tomorrow at home.   Follow up with neurologist and PMD.   See your doctor in a week to recheck blood pressure.   Return to ER if you have severe pain, chest pain, shortness of breath, numbness, weakness.

## 2014-11-16 NOTE — ED Notes (Signed)
Pt here for onset of dizziness and right arm and facial/head numbness that started at 6:30 am. sts also 6 weeks of right sided chest pain with SOB.

## 2014-11-16 NOTE — ED Provider Notes (Addendum)
CSN: [id number]     Arrival date & time 11/16/14  0708 History   First MD Initiated Contact with Patient 11/16/14 0719     Chief Complaint  Patient presents with  . Dizziness  . Numbness  . Chest Pain     (Consider location/radiation/quality/duration/timing/severity/associated sxs/prior Treatment) The history is provided by the patient.  Jose Bishop is a 49 y.o. male history of TIA, diabetes here presenting with chest pain, dizziness. Patient has been having intermittent chest pain for the last 6 weeks. Sometimes associated with some shortness of breath. 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 He saw his doctor was diagnosed with pneumonia and finished a course of Levaquin about a week ago. This went back to work this past week. This morning he woke up around 5:30 AM. He was on the road driving his truck and around 6:30 AM felt acute right facial numbness as well as right arm weakness. Also feels lightheaded and dizzy. He had worsening chest pain shortness of breath at that time. Denies any cough or fevers. Saw [facility] a month ago and was scheduled for MRI/MRA but didn't get them yet. Has family hx of cerebral aneurysm but no personal hx of it.    Past Medical History  Diagnosis Date  . TIA (transient ischemic attack)   . Diabetes   . Memory loss   . Headache    Past Surgical History  Procedure Laterality Date  . Hernia repair     Family History  Problem Relation Age of Onset  . Anuerysm Mother   . Stroke Mother   . Hypertension Father   . Peripheral vascular disease Father   . Diabetes Sister   . Heart murmur Sister    History  Substance Use Topics  . Smoking status: Never Smoker   . Smokeless tobacco: Never Used  . Alcohol Use: 0.0 oz/week    0 Standard drinks or equivalent per week    Review of Systems  Cardiovascular: Positive for chest pain.  Neurological: Positive for dizziness.  All other systems reviewed and are  negative.     Allergies  Review of patient's allergies indicates no known allergies.  Home Medications   Prior to Admission medications   Medication Sig Start Date End Date Taking? Authorizing Provider  acetaminophen (TYLENOL) 500 MG tablet Take 1,000 mg by mouth every 6 (six) hours as needed for moderate pain.   Yes Historical Provider, MD  levofloxacin (LEVAQUIN) 500 MG tablet Take 500 mg by mouth daily. 11/12/14  Yes Historical Provider, MD  metFORMIN (GLUCOPHAGE) 500 MG tablet Take 500 mg by mouth daily.    Yes Historical Provider, MD  PROAIR RESPICLICK 834 (90 BASE) MCG/ACT AEPB Inhale 2 puffs into the lungs daily as needed (Shortness of breath).  11/03/14  Yes Historical Provider, MD   BP 127/82 mmHg  Pulse 48  Temp(Src) 98.3 F (36.8 C) (Oral)  Resp 17  SpO2 97% Physical Exam  Constitutional: He is oriented to person, place, and time.  Uncomfortable   HENT:  Head: Normocephalic.  Mouth/Throat: Oropharynx is clear and moist.  Eyes: Conjunctivae and EOM are normal. Pupils are equal, round, and reactive to light.  Neck: Normal range of motion. Neck supple.  Cardiovascular: Normal rate, regular rhythm and normal heart sounds.   Pulmonary/Chest: Effort normal and breath sounds normal. No respiratory distress. He has no wheezes. He has no rales.  Abdominal: Soft. Bowel sounds are normal. He exhibits no  distension. There is no tenderness. There is no rebound.  Musculoskeletal: Normal range of motion. He exhibits no edema or tenderness.  Neurological: He is alert and oriented to person, place, and time.  Dec sensation R face. No obvious facial droop. Strength 4/5 R side and 5/5 L side.   Skin: Skin is warm and dry.  Psychiatric: He has a normal mood and affect. His behavior is normal. Judgment and thought content normal.  Nursing note and vitals reviewed.   ED Course  Procedures (including critical care time) Labs Review Labs Reviewed  DIFFERENTIAL - Abnormal; Notable for  the following:    Neutrophils Relative % 34 (*)    Lymphocytes Relative 48 (*)    Eosinophils Relative 7 (*)    All other components within normal limits  COMPREHENSIVE METABOLIC PANEL - Abnormal; Notable for the following:    Sodium 133 (*)    Chloride 100 (*)    CO2 21 (*)    Glucose, Bld 167 (*)    All other components within normal limits  I-STAT CHEM 8, ED - Abnormal; Notable for the following:    Glucose, Bld 170 (*)    All other components within normal limits  ETHANOL  PROTIME-INR  APTT  CBC  URINE RAPID DRUG SCREEN (HOSP PERFORMED)  URINALYSIS, ROUTINE W REFLEX MICROSCOPIC  I-STAT TROPOININ, ED  Randolm Idol, ED    Imaging Review Dg Neck Soft Tissue  11/16/2014   CLINICAL DATA:  Difficulty swallowing.  EXAM: NECK SOFT TISSUES - 1+ VIEW  COMPARISON:  None.  FINDINGS: There is no evidence of retropharyngeal soft tissue swelling or epiglottic enlargement. The cervical airway is unremarkable and no radio-opaque foreign body identified.  IMPRESSION: No abnormality seen in the soft tissues of the neck.   Electronically Signed   By: Marijo Conception, M.D.   On: 11/16/2014 11:00   Dg Chest 2 View  11/16/2014   CLINICAL DATA:  Chest pain with shortness of breath for past 6 weeks  EXAM: CHEST  2 VIEW  COMPARISON:  Chest radiograph May 31, 2004 and chest CT June 23, 2005  FINDINGS: Lungs are clear. Heart is upper normal in size with pulmonary vascularity within normal limits. No adenopathy. No bone lesions.  IMPRESSION: No edema or consolidation.   Electronically Signed   By: Lowella Grip III M.D.   On: 11/16/2014 10:59   Ct Head Wo Contrast  11/16/2014   CLINICAL DATA:  Dizziness, numbness, right-sided weakness and right upper chest pain.  EXAM: CT HEAD WITHOUT CONTRAST  TECHNIQUE: Contiguous axial images were obtained from the base of the skull through the vertex without intravenous contrast.  COMPARISON:  None.  FINDINGS: The brain demonstrates no evidence of  hemorrhage, infarction, edema, mass effect, extra-axial fluid collection, hydrocephalus or mass lesion. The skull is unremarkable.  IMPRESSION: Normal head CT.  These results were called by telephone at the time of interpretation on 11/16/2014 at 7:44 am to Dr. Janann Colonel, who verbally acknowledged these results.   Electronically Signed   By: Aletta Edouard M.D.   On: 11/16/2014 07:44   Mr Virgel Paling Wo Contrast  11/16/2014   CLINICAL DATA:  49 year old diabetic male presenting with dizziness and right arm/facial numbness. Initial encounter.  EXAM: MRI HEAD WITHOUT CONTRAST  MRA HEAD WITHOUT CONTRAST  TECHNIQUE: Multiplanar, multiecho pulse sequences of the brain and surrounding structures were obtained without intravenous contrast. Angiographic images of the head were obtained using MRA technique without contrast.  COMPARISON:  11/16/2014  head CT.  04/24/2005 brain MR.  FINDINGS: MRI HEAD FINDINGS  Some of the sequences are motion degraded.  No acute infarct.  No intracranial hemorrhage.  No hydrocephalus.  No intracranial mass lesion noted on this unenhanced exam.  Partially empty minimally expanded sella. Slightly prominent peri optic spaces. No flattening of the posterior globes as may be seen with pseudotumor cerebri. Mild exophthalmos. Bilateral slightly prominent superior ophthalmic veins. Appearance without significant change since 2006.  Left parotid 9.8 x 5.9 mm T2 bright lesion possibly an intraparotid lymph node although primary parotid lesion such as pleomorphic adenoma cannot be excluded.  Cervical medullary junction and pineal region unremarkable.  Minimal mucosal thickening paranasal sinuses slightly polypoid in appearance inferior aspect maxillary sinuses bilaterally.  MRA HEAD FINDINGS  Exam is motion degraded.  Ectatic cervical segment of the internal carotid artery greater on the right.  Ectatic slightly irregular cavernous segment of the internal carotid artery bilaterally without high-grade  stenosis.  Fetal type contribution to the posterior cerebral artery without aneurysm noted.  Minimal bulge superior margin of the M1 segment of the left middle cerebral artery appears be origin of vessel rather than aneurysm.  Left vertebral artery is slightly dominant in size.  Nonvisualized right posterior inferior cerebellar artery.  Mild irregularity and narrowing of the basilar artery.  Nonvisualized left anterior inferior cerebellar artery with and narrowed right anterior inferior cerebellar artery.  Mild irregularity of the superior cerebellar artery bilaterally.  Mild irregularity posterior cerebral artery distal branches bilaterally.  IMPRESSION: MRI HEAD  Some of the sequences are motion degraded.  No acute infarct.  Partially empty minimally expanded sella. Slightly prominent peri optic spaces. No flattening of the posterior globes as may be seen with pseudotumor cerebri. Mild exophthalmos. Bilateral slightly prominent superior ophthalmic veins. Appearance without significant change since 2006.  Left parotid 9.8 x 5.9 mm T2 bright lesion possibly an intraparotid lymph node although primary parotid lesion such as pleomorphic adenoma cannot be excluded.  MRA HEAD  Exam is motion degraded.  Ectatic cervical segment of the internal carotid artery greater on the right.  Ectatic slightly irregular cavernous segment of the internal carotid artery bilaterally without high-grade stenosis.  Nonvisualized right posterior inferior cerebellar artery.  Mild irregularity and narrowing of the basilar artery.  Nonvisualized left anterior inferior cerebellar artery with and narrowed right anterior inferior cerebellar artery.  Mild irregularity of the superior cerebellar artery bilaterally.  Mild irregularity posterior cerebral artery distal branches bilaterally.   Electronically Signed   By: Genia Del M.D.   On: 11/16/2014 09:36   Mr Brain Wo Contrast  11/16/2014   CLINICAL DATA:  49 year old diabetic male  presenting with dizziness and right arm/facial numbness. Initial encounter.  EXAM: MRI HEAD WITHOUT CONTRAST  MRA HEAD WITHOUT CONTRAST  TECHNIQUE: Multiplanar, multiecho pulse sequences of the brain and surrounding structures were obtained without intravenous contrast. Angiographic images of the head were obtained using MRA technique without contrast.  COMPARISON:  11/16/2014 head CT.  04/24/2005 brain MR.  FINDINGS: MRI HEAD FINDINGS  Some of the sequences are motion degraded.  No acute infarct.  No intracranial hemorrhage.  No hydrocephalus.  No intracranial mass lesion noted on this unenhanced exam.  Partially empty minimally expanded sella. Slightly prominent peri optic spaces. No flattening of the posterior globes as may be seen with pseudotumor cerebri. Mild exophthalmos. Bilateral slightly prominent superior ophthalmic veins. Appearance without significant change since 2006.  Left parotid 9.8 x 5.9 mm T2 bright lesion possibly an intraparotid  lymph node although primary parotid lesion such as pleomorphic adenoma cannot be excluded.  Cervical medullary junction and pineal region unremarkable.  Minimal mucosal thickening paranasal sinuses slightly polypoid in appearance inferior aspect maxillary sinuses bilaterally.  MRA HEAD FINDINGS  Exam is motion degraded.  Ectatic cervical segment of the internal carotid artery greater on the right.  Ectatic slightly irregular cavernous segment of the internal carotid artery bilaterally without high-grade stenosis.  Fetal type contribution to the posterior cerebral artery without aneurysm noted.  Minimal bulge superior margin of the M1 segment of the left middle cerebral artery appears be origin of vessel rather than aneurysm.  Left vertebral artery is slightly dominant in size.  Nonvisualized right posterior inferior cerebellar artery.  Mild irregularity and narrowing of the basilar artery.  Nonvisualized left anterior inferior cerebellar artery with and narrowed right  anterior inferior cerebellar artery.  Mild irregularity of the superior cerebellar artery bilaterally.  Mild irregularity posterior cerebral artery distal branches bilaterally.  IMPRESSION: MRI HEAD  Some of the sequences are motion degraded.  No acute infarct.  Partially empty minimally expanded sella. Slightly prominent peri optic spaces. No flattening of the posterior globes as may be seen with pseudotumor cerebri. Mild exophthalmos. Bilateral slightly prominent superior ophthalmic veins. Appearance without significant change since 2006.  Left parotid 9.8 x 5.9 mm T2 bright lesion possibly an intraparotid lymph node although primary parotid lesion such as pleomorphic adenoma cannot be excluded.  MRA HEAD  Exam is motion degraded.  Ectatic cervical segment of the internal carotid artery greater on the right.  Ectatic slightly irregular cavernous segment of the internal carotid artery bilaterally without high-grade stenosis.  Nonvisualized right posterior inferior cerebellar artery.  Mild irregularity and narrowing of the basilar artery.  Nonvisualized left anterior inferior cerebellar artery with and narrowed right anterior inferior cerebellar artery.  Mild irregularity of the superior cerebellar artery bilaterally.  Mild irregularity posterior cerebral artery distal branches bilaterally.   Electronically Signed   By: Genia Del M.D.   On: 11/16/2014 09:36     EKG Interpretation   Date/Time:  Monday Nov 16 2014 07:15:36 EDT Ventricular Rate:  52 PR Interval:  155 QRS Duration: 91 QT Interval:  435 QTC Calculation: 404 R Axis:   13 Text Interpretation:  Sinus rhythm Atrial premature complex No significant  change since last tracing Confirmed by Aryaman Haliburton  MD, Ziva Nunziata ([id number]) on 11/16/2014  7:32:43 AM      MDM   Final diagnoses:  Chest pain   Jose Bishop is a 49 y.o. male here with stroke like symptoms, chest pain. Patient within window for code stroke. Code stroke was activated. Chest pain for  6 weeks, will get trop to r/o ACS. 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 Not tachy or hypoxic so I doubt PE.   11:57 AM Patient's CT and MRI unremarkable and showed no acute stroke. BP improved with hctz. Delta trop neg. cxr unremarkable. Initially not passed swallow eval but then able to pass it. Feels better now. Neurology felt that patient safe for dc. Will start ASA 81 mg daily and HCTZ. Will have him f/u with [facility] and PMD. Moreover, has R sided reproducible chest pain. He carries heavy things at work. Likely MSK so will give motrin, flexeril.   Wandra Arthurs, MD 11/16/14 1159  Wandra Arthurs, MD 11/16/14 [phone]

## 2014-11-16 NOTE — ED Notes (Signed)
Off unit with Xray. 

## 2014-11-16 NOTE — ED Notes (Signed)
Pt accompanied to X-ray by RN

## 2014-11-16 NOTE — Code Documentation (Signed)
Patient last know well this am at 0630.  He woke up this am about 5:15am and completed his normal morning routine.  While at work he stated that his right arm became weak.  He arrived at the ED this morning at 0708.  Code stroke was called at 0727 for right weakness and numbness.  Stat labs and head CT done.  NIHSS 1 for mild sensory loss on his right face.  He describes his right arm and leg being heavy but when assessed he did not have a drift.  Stat MRI done.

## 2014-11-16 NOTE — ED Notes (Signed)
Called Shanon with speech therapy to cancel consult due to passing swallow screen.

## 2014-11-16 NOTE — Consult Note (Signed)
Stroke Consult    Chief Complaint: dizziness and right sided weakness  HPI: Jose Bishop is an 49 y.o. male with history of multiple TIAs, headache presenting with acute onset of dizziness and right sided weakness. Per patient, woke at normal at 0515. At 0630 noted acute onset of right sided heaviness and dizziness. Also notes right sided chest pain. Symptoms persisted so came to ED. Upon arrival CBG 170 and BP 183/102. HR 50.   Patient recently evaluated by outpatient neurologist, Dr Loretta Plume, for headache and recurrent TIAs consisting of left sided deficits. MRI and MRA ordered but not completed. Patient notes family history of aneurysm. Head CT imaging reviewed and overall unremarkable. Initial NIHSS of 1 for right facial sensory deficits.   Date last known well: 11/16/2014 Time last known well: 0630 tPA Given: No, initial NIHSS of 1  Past Medical History  Diagnosis Date  . TIA (transient ischemic attack)   . Diabetes   . Memory loss   . Headache     Past Surgical History  Procedure Laterality Date  . Hernia repair      Family History  Problem Relation Age of Onset  . Anuerysm Mother   . Stroke Mother   . Hypertension Father   . Peripheral vascular disease Father   . Diabetes Sister   . Heart murmur Sister    Social History:  reports that he has never smoked. He has never used smokeless tobacco. He reports that he drinks alcohol. He reports that he does not use illicit drugs.  Allergies: No Known Allergies   (Not in a hospital admission)  ROS: Out of a complete 14 system review, the patient complains of only the following symptoms, and all other reviewed systems are negative. +dizziness, chest pain  Physical Examination: Filed Vitals:   11/16/14 0745  BP: 163/96  Pulse: 50  Temp:   Resp: 24   Physical Exam  Constitutional: He appears well-developed and well-nourished.  Psych: Affect appropriate to situation Eyes: No scleral injection HENT: No OP  obstrucion Head: Normocephalic.  Cardiovascular: Normal rate and regular rhythm.  Respiratory: Effort normal and breath sounds normal.  GI: Soft. Bowel sounds are normal. No distension. There is no tenderness.  Skin: WDI  Neurologic Examination: Mental Status: Alert, oriented, thought content appropriate.  Slow speech but no signs of aphasia. No dysarthria. Able to follow 3 step commands without difficulty. Cranial Nerves: II: funduscopic exam wnl bilaterally, visual fields grossly normal, pupils equal, round, reactive to light and accommodation III,IV, VI: ptosis not present, extra-ocular motions intact bilaterally V,VII: smile symmetric, decreased LT on right side of V1-V3 VIII: hearing normal bilaterally IX,X: gag reflex present XI: trapezius strength/neck flexion strength normal bilaterally XII: tongue strength normal  Motor: no UE or LE drift noted Right : Upper extremity    Left:     Upper extremity 5-/5 deltoid       5/5 deltoid 5-/5 biceps      5/5 biceps  5-/5 triceps      5/5 triceps 5-/5 hand grip      5/5 hand grip  Lower extremity     Lower extremity 5/5 hip flexor      5/5 hip flexor 5/5 quadricep      5/5 quadriceps  5/5 hamstrings     5/5 hamstrings 5/5 plantar flexion       5/5 plantar flexion 5/5 plantar extension     5/5 plantar extension Tone and bulk:normal tone throughout; no atrophy noted Sensory: Pinprick  and light touch intact throughout, bilaterally Deep Tendon Reflexes: 2+ and symmetric throughout Plantars: Right: downgoing   Left: downgoing Cerebellar: normal finger-to-nose, normal rapid alternating movements and normal heel-to-shin test Gait: normal gait and station  Laboratory Studies:   Basic Metabolic Panel:  Recent Labs Lab 11/16/14 0742  NA 135  K 3.9  CL 103  GLUCOSE 170*  BUN 17  CREATININE 1.10    Liver Function Tests: No results for input(s): AST, ALT, ALKPHOS, BILITOT, PROT, ALBUMIN in the last 168 hours. No results for  input(s): LIPASE, AMYLASE in the last 168 hours. No results for input(s): AMMONIA in the last 168 hours.  CBC:  Recent Labs Lab 11/16/14 0735 11/16/14 0742  WBC 5.1  --   NEUTROABS 1.7  --   HGB 14.9 16.3  HCT 44.6 48.0  MCV 90.3  --   PLT 247  --     Cardiac Enzymes: No results for input(s): CKTOTAL, CKMB, CKMBINDEX, TROPONINI in the last 168 hours.  BNP: Invalid input(s): POCBNP  CBG: No results for input(s): GLUCAP in the last 168 hours.  Microbiology: No results found for this or any previous visit.  Coagulation Studies: No results for input(s): LABPROT, INR in the last 72 hours.  Urinalysis: No results for input(s): COLORURINE, LABSPEC, PHURINE, GLUCOSEU, HGBUR, BILIRUBINUR, KETONESUR, PROTEINUR, UROBILINOGEN, NITRITE, LEUKOCYTESUR in the last 168 hours.  Invalid input(s): APPERANCEUR  Lipid Panel:  No results found for: CHOL, TRIG, HDL, CHOLHDL, VLDL, LDLCALC  HgbA1C: No results found for: HGBA1C  Urine Drug Screen:  No results found for: LABOPIA, COCAINSCRNUR, LABBENZ, AMPHETMU, THCU, LABBARB  Alcohol Level: No results for input(s): ETH in the last 168 hours.  Other results:  Imaging: Ct Head Wo Contrast  11/16/2014   CLINICAL DATA:  Dizziness, numbness, right-sided weakness and right upper chest pain.  EXAM: CT HEAD WITHOUT CONTRAST  TECHNIQUE: Contiguous axial images were obtained from the base of the skull through the vertex without intravenous contrast.  COMPARISON:  None.  FINDINGS: The brain demonstrates no evidence of hemorrhage, infarction, edema, mass effect, extra-axial fluid collection, hydrocephalus or mass lesion. The skull is unremarkable.  IMPRESSION: Normal head CT.  These results were called by telephone at the time of interpretation on 11/16/2014 at 7:44 am to Dr. Janann Colonel, who verbally acknowledged these results.   Electronically Signed   By: Aletta Edouard M.D.   On: 11/16/2014 07:44    Assessment: 49 y.o. male hx of DM, TIA, headache  presenting with acute onset of right sided heaviness, dizziness and chest pain. Initial NIHSS of 1. BP elevated and noted bradycardia. Head CT imaging unremarkable. Unclear etiology of symptoms. Sent for stat MRI/A which showed no acute process (imaging reviewed). Question if related to hypertensive crisis vs possible complex migraine.  -gradual lowering of blood pressure -speech therapy evaluation for reported difficulty swallowing -will need outpatient neurology follow up   Stroke Risk Factors - TIA, DM   Jim Like, DO [facility] [phone]  If 7pm- 7am, please page neurology on call as listed in [facility]. 11/16/2014, 7:59 AM

## 2014-11-30 ENCOUNTER — Other Ambulatory Visit: Payer: Self-pay | Admitting: Urology

## 2014-12-03 ENCOUNTER — Other Ambulatory Visit (HOSPITAL_COMMUNITY): Payer: Self-pay | Admitting: Urology

## 2014-12-03 DIAGNOSIS — C61 Malignant neoplasm of prostate: Secondary | ICD-10-CM

## 2014-12-29 ENCOUNTER — Ambulatory Visit (HOSPITAL_COMMUNITY)
Admission: RE | Admit: 2014-12-29 | Discharge: 2014-12-29 | Disposition: A | Discharge status: Home / Self Care | Payer: 59 | Source: Ambulatory Visit | Attending: Urology | Admitting: Urology

## 2014-12-29 DIAGNOSIS — C61 Malignant neoplasm of prostate: Secondary | ICD-10-CM | POA: Diagnosis not present

## 2014-12-29 DIAGNOSIS — N400 Enlarged prostate without lower urinary tract symptoms: Secondary | ICD-10-CM | POA: Insufficient documentation

## 2014-12-29 LAB — POCT I-STAT CREATININE: Creatinine, Ser: 1.1 mg/dL (ref 0.61–1.24)

## 2014-12-29 MED ORDER — GADOBENATE DIMEGLUMINE 529 MG/ML IV SOLN
20.0000 mL | Freq: Once | INTRAVENOUS | Status: AC | PRN
  Administered 2014-12-29: 20 mL via INTRAVENOUS

## 2014-12-29 NOTE — Progress Notes (Signed)
Chest x-ray 11/16/14 on EPIC, EKG 11/16/14 on EPIC

## 2014-12-30 ENCOUNTER — Encounter (HOSPITAL_COMMUNITY): Payer: Self-pay

## 2014-12-30 ENCOUNTER — Encounter (HOSPITAL_COMMUNITY)
Admission: RE | Admit: 2014-12-30 | Discharge: 2014-12-30 | Disposition: A | Discharge status: Home / Self Care | Payer: 59 | Source: Ambulatory Visit | Attending: Urology | Admitting: Urology

## 2014-12-30 DIAGNOSIS — Z01812 Encounter for preprocedural laboratory examination: Secondary | ICD-10-CM | POA: Diagnosis present

## 2014-12-30 DIAGNOSIS — C61 Malignant neoplasm of prostate: Secondary | ICD-10-CM | POA: Insufficient documentation

## 2014-12-30 HISTORY — DX: Sleep disorder, unspecified: G47.9

## 2014-12-30 HISTORY — DX: Essential (primary) hypertension: I10

## 2014-12-30 HISTORY — DX: Malignant neoplasm of prostate: C61

## 2014-12-30 HISTORY — DX: Unspecified osteoarthritis, unspecified site: M19.90

## 2014-12-30 LAB — CBC
HEMATOCRIT: 44.7 % (ref 39.0–52.0)
HEMOGLOBIN: 14.6 g/dL (ref 13.0–17.0)
MCH: 30.2 pg (ref 26.0–34.0)
MCHC: 32.7 g/dL (ref 30.0–36.0)
MCV: 92.4 fL (ref 78.0–100.0)
Platelets: 283 10*3/uL (ref 150–400)
RBC: 4.84 MIL/uL (ref 4.22–5.81)
RDW: 13.6 % (ref 11.5–15.5)
WBC: 5.1 10*3/uL (ref 4.0–10.5)

## 2014-12-30 LAB — BASIC METABOLIC PANEL
ANION GAP: 9 (ref 5–15)
BUN: 16 mg/dL (ref 6–20)
CALCIUM: 9.5 mg/dL (ref 8.9–10.3)
CHLORIDE: 106 mmol/L (ref 101–111)
CO2: 26 mmol/L (ref 22–32)
Creatinine, Ser: 1.38 mg/dL — ABNORMAL HIGH (ref 0.61–1.24)
GFR calc non Af Amer: 59 mL/min — ABNORMAL LOW (ref >60)
Glucose, Bld: 152 mg/dL — ABNORMAL HIGH (ref 65–99)
Potassium: 5.2 mmol/L — ABNORMAL HIGH (ref 3.5–5.1)
Sodium: 141 mmol/L (ref 135–145)

## 2014-12-30 NOTE — Patient Instructions (Addendum)
YOUR PROCEDURE IS SCHEDULED ON : 01/04/15  REPORT TO [facility] MAIN ENTRANCE FOLLOW SIGNS TO EAST ELEVATOR - GO TO 3rd FLOOR CHECK IN AT 3 EAST NURSES STATION (SHORT STAY) AT: 9:00 AM  CALL THIS NUMBER IF YOU HAVE PROBLEMS THE MORNING OF SURGERY [phone]  REMEMBER:ONLY 1 PER PERSON MAY GO TO SHORT STAY WITH YOU TO GET READY THE MORNING OF YOUR SURGERY  DO NOT EAT FOOD OR DRINK LIQUIDS AFTER MIDNIGHT  TAKE THESE MEDICINES THE MORNING OF SURGERY: NONE  YOU MAY NOT HAVE ANY METAL ON YOUR BODY INCLUDING HAIR PINS AND PIERCING'S. DO NOT WEAR JEWELRY, MAKEUP, LOTIONS, POWDERS OR PERFUMES. DO NOT WEAR NAIL POLISH. DO NOT SHAVE 48 HRS PRIOR TO SURGERY. MEN MAY SHAVE FACE AND NECK.  DO NOT [place]. [facility] IS NOT RESPONSIBLE FOR VALUABLES.  CONTACTS, DENTURES OR PARTIALS MAY NOT BE WORN TO SURGERY. LEAVE SUITCASE IN CAR. CAN BE BROUGHT TO ROOM AFTER SURGERY.  PATIENTS DISCHARGED THE DAY OF SURGERY WILL NOT BE ALLOWED TO DRIVE HOME.  PLEASE READ OVER THE FOLLOWING INSTRUCTION SHEETS _________________________________________________________________________________                                          [facility] - PREPARING FOR SURGERY  Before surgery, you can play an important role.  Because skin is not sterile, your skin needs to be as free of germs as possible.  You can reduce the number of germs on your skin by washing with CHG (chlorahexidine gluconate) soap before surgery.  CHG is an antiseptic cleaner which kills germs and bonds with the skin to continue killing germs even after washing. Please DO NOT use if you have an allergy to CHG or antibacterial soaps.  If your skin becomes reddened/irritated stop using the CHG and inform your nurse when you arrive at Short Stay. Do not shave (including legs and underarms) for at least 48 hours prior to the first CHG shower.  You may shave your face. Please follow these instructions  carefully:   1.  Shower with CHG Soap the night before surgery and the  morning of Surgery.   2.  If you choose to wash your hair, wash your hair first as usual with your  normal  Shampoo.   3.  After you shampoo, rinse your hair and body thoroughly to remove the  shampoo.                                         4.  Use CHG as you would any other liquid soap.  You can apply chg directly  to the skin and wash . Gently wash with scrungie or clean wascloth    5.  Apply the CHG Soap to your body ONLY FROM THE NECK DOWN.   Do not use on open                           Wound or open sores. Avoid contact with eyes, ears mouth and genitals (private parts).                        Genitals (private parts) with your normal soap.  6.  Wash thoroughly, paying special attention to the area where your surgery  will be performed.   7.  Thoroughly rinse your body with warm water from the neck down.   8.  DO NOT shower/wash with your normal soap after using and rinsing off  the CHG Soap .                9.  Pat yourself dry with a clean towel.             10.  Wear clean night clothes to bed after shower             11.  Place clean sheets on your bed the night of your first shower and do not  sleep with pets.  Day of Surgery : Do not apply any lotions/deodorants the morning of surgery.  Please wear clean clothes to the hospital/surgery center.  FAILURE TO FOLLOW THESE INSTRUCTIONS MAY RESULT IN THE CANCELLATION OF YOUR SURGERY    PATIENT SIGNATURE_________________________________  ______________________________________________________________________     Adam Phenix  An incentive spirometer is a tool that can help keep your lungs clear and active. This tool measures how well you are filling your lungs with each breath. Taking long deep breaths may help reverse or decrease the chance of developing breathing (pulmonary) problems (especially infection) following:  A long  period of time when you are unable to move or be active. BEFORE THE PROCEDURE   If the spirometer includes an indicator to show your best effort, your nurse or respiratory therapist will set it to a desired goal.  If possible, sit up straight or lean slightly forward. Try not to slouch.  Hold the incentive spirometer in an upright position. INSTRUCTIONS FOR USE   Sit on the edge of your bed if possible, or sit up as far as you can in bed or on a chair.  Hold the incentive spirometer in an upright position.  Breathe out normally.  Place the mouthpiece in your mouth and seal your lips tightly around it.  Breathe in slowly and as deeply as possible, raising the piston or the ball toward the top of the column.  Hold your breath for 3-5 seconds or for as long as possible. Allow the piston or ball to fall to the bottom of the column.  Remove the mouthpiece from your mouth and breathe out normally.  Rest for a few seconds and repeat Steps 1 through 7 at least 10 times every 1-2 hours when you are awake. Take your time and take a few normal breaths between deep breaths.  The spirometer may include an indicator to show your best effort. Use the indicator as a goal to work toward during each repetition.  After each set of 10 deep breaths, practice coughing to be sure your lungs are clear. If you have an incision (the cut made at the time of surgery), support your incision when coughing by placing a pillow or rolled up towels firmly against it. Once you are able to get out of bed, walk around indoors and cough well. You may stop using the incentive spirometer when instructed by your caregiver.  RISKS AND COMPLICATIONS  Take your time so you do not get dizzy or light-headed.  If you are in pain, you may need to take or ask for pain medication before doing incentive spirometry. It is harder to take a deep breath if you are having pain. AFTER USE  Rest and breathe slowly and easily.  It can  be helpful to keep track of a log of your progress. Your caregiver can provide you with a simple table to help with this. If you are using the spirometer at home, follow these instructions: [place] IF:   You are having difficultly using the spirometer.  You have trouble using the spirometer as often as instructed.  Your pain medication is not giving enough relief while using the spirometer.  You develop fever of 100.5 F (38.1 C) or higher. SEEK IMMEDIATE MEDICAL CARE IF:   You cough up bloody sputum that had not been present before.  You develop fever of 102 F (38.9 C) or greater.  You develop worsening pain at or near the incision site. MAKE SURE YOU:   Understand these instructions.  Will watch your condition.  Will get help right away if you are not doing well or get worse. Document Released: 10/23/2006 Document Revised: 09/04/2011 Document Reviewed: 12/24/2006 Exit[employer] Patient Information 2014 [employer], [place].   ________________________________________________________________________  WHAT IS A BLOOD TRANSFUSION? Blood Transfusion Information  A transfusion is the replacement of blood or some of its parts. Blood is made up of multiple cells which provide different functions.  Red blood cells carry oxygen and are used for blood loss replacement.  White blood cells fight against infection.  Platelets control bleeding.  Plasma helps clot blood.  Other blood products are available for specialized needs, such as hemophilia or other clotting disorders. BEFORE THE TRANSFUSION  Who gives blood for transfusions?   Healthy volunteers who are fully evaluated to make sure their blood is safe. This is blood bank blood. Transfusion therapy is the safest it has ever been in the practice of medicine. Before blood is taken from a donor, a complete history is taken to make sure that person has no history of diseases nor engages in risky social behavior (examples are  intravenous drug use or sexual activity with multiple partners). The donor's travel history is screened to minimize risk of transmitting infections, such as malaria. The donated blood is tested for signs of infectious diseases, such as HIV and hepatitis. The blood is then tested to be sure it is compatible with you in order to minimize the chance of a transfusion reaction. If you or a relative donates blood, this is often done in anticipation of surgery and is not appropriate for emergency situations. It takes many days to process the donated blood. RISKS AND COMPLICATIONS Although transfusion therapy is very safe and saves many lives, the main dangers of transfusion include:   Getting an infectious disease.  Developing a transfusion reaction. This is an allergic reaction to something in the blood you were given. Every precaution is taken to prevent this. The decision to have a blood transfusion has been considered carefully by your caregiver before blood is given. Blood is not given unless the benefits outweigh the risks. AFTER THE TRANSFUSION  Right after receiving a blood transfusion, you will usually feel much better and more energetic. This is especially true if your red blood cells have gotten low (anemic). The transfusion raises the level of the red blood cells which carry oxygen, and this usually causes an energy increase.  The nurse administering the transfusion will monitor you carefully for complications. HOME CARE INSTRUCTIONS  No special instructions are needed after a transfusion. You may find your energy is better. Speak with your caregiver about any limitations on activity for underlying diseases you may have. SEEK MEDICAL CARE IF:   Your  condition is not improving after your transfusion.  You develop redness or irritation at the intravenous (IV) site. SEEK IMMEDIATE MEDICAL CARE IF:  Any of the following symptoms occur over the next 12 hours:  Shaking chills.  You have a  temperature by mouth above 102 F (38.9 C), not controlled by medicine.  Chest, back, or muscle pain.  People around you feel you are not acting correctly or are confused.  Shortness of breath or difficulty breathing.  Dizziness and fainting.  You get a rash or develop hives.  You have a decrease in urine output.  Your urine turns a dark color or changes to pink, red, or brown. Any of the following symptoms occur over the next 10 days:  You have a temperature by mouth above 102 F (38.9 C), not controlled by medicine.  Shortness of breath.  Weakness after normal activity.  The white part of the eye turns yellow (jaundice).  You have a decrease in the amount of urine or are urinating less often.  Your urine turns a dark color or changes to pink, red, or brown. Document Released: 06/09/2000 Document Revised: 09/04/2011 Document Reviewed: 01/27/2008 [facility] Patient Information 2014 [place], [place].  _______________________________________________________________________

## 2014-12-31 NOTE — H&P (Signed)
Chief Complaint Prostate Cancer   History of Present Illness Jose Bishop is a 48 year old gentleman who presented to Dr. Diona Fanti in April with complaints of worsening lower urinary tract symptoms and erectile dysfunction. He was found to have an elevated PSA of 5.15 that was repeated and remained elevated at 4.70 with a free percentage of 5%. This prompted a prostate needle biopsy on 11/13/14 that confirmed Gleason 3+4=7 adenocarcinoma of the prostate with 4 out of 12 biopsy cores positive for malignancy. Hypoechoic areas were noted at the left mid and base peripherally on ultrasound. He has no family history of prostate cancer. His only comorbid condition is recently diagnosed diabetes and hypertension. However, he apparently developed symptoms a few days after his recent biopsy that included some right sided weakness and he presented the emergency room with significant hypertension and was started on antihypertensive therapy. He was diagnosed with a possible TIA. His evaluation including a CT scan of the head and MRI of the head/MRA were relatively unremarkable. He is scheduled for outpatient neurology follow-up and was evaluated during his emergency room visit with a neurology consultation.    ** He has previously undergone an umbilical and right inguinal hernia repair both with mesh.    TNM stage: cT1c Nx Mx (concern on left side of prostate although unclear if this is post-biopsy change)  PSA: 4.70  Gleason score: 3+4=7  Biopsy (11/13/14): 4/12 cores positive -- L lateral apex (< 5%, 3+3=6), L lateral mid (60%, 3+4=7, PNI), L lateral base (60%, 3+4=7, PNI), L base (10%, 3+3=6)  Prostate volume: 57.6 cc    Nomogram  OC disease: 46%  EPE: 53%  SVI: 1%  LNI: 2%  PFS (surgery): 90% at 5 years, 82% at10 years    Urinary function: He does have baseline symptoms including incomplete emptying, frequency, intermittency, urgency, weak stream, straining, and nocturia. IPSS is  22.  Erectile dysfunction: He has noted only mild intermittent difficulty with erections. SHIM score is 24.     Past Medical History Problems  1. History of Arthritis 2. History of diabetes mellitus (Z86.39) 3. History of heartburn (Z87.898) 4. History of hypertension (Z86.79) 5. History of transient cerebral ischemia (I95.18)  Surgical History Problems  1. History of Arm Incision 2. History of Hernia Repair 3. History of Surgery Testis 4. History of Umbilical Hernia Repair  He underwent bilateral orchidopexy due to testicular torsion many years ago. He has undergone a right inguinal hernia performed in an open fashion. He is also undergone an umbilical hernia repair in an open fashion with mesh.   Current Meds 1. MetFORMIN HCl TABS;  Therapy: (Recorded:08Apr2016) to Recorded  He has now also on an antihypertensive medication although cannot recall the name of it today.   Allergies Medication  1. No Known Drug Allergies  Family History Problems  1. Family history of colon cancer (Z80.0) : Father 2. Denied: Family history of prostate cancer  Social History Problems    Alcohol Use   2   Marital History - Currently Married   Never a smoker   Occupation:   truck Geophysicist/field seismologist- [employer]  Review of Systems Constitutional, skin, eye, otolaryngeal, hematologic/lymphatic, cardiovascular, pulmonary, endocrine, musculoskeletal, gastrointestinal, neurological and psychiatric system(s) were reviewed and pertinent findings if present are noted and are otherwise negative.    Vitals  Height: 6 ft 3 in Weight: 266 lb  BMI Calculated: 33.25 BSA Calculated: 2.48   Physical Exam Constitutional: Well nourished and well developed . No acute distress.  ENT:. The ears and nose are normal in appearance.  Neck: The appearance of the neck is normal and no neck mass is present.  Pulmonary: No respiratory distress, normal respiratory rhythm and effort and clear bilateral breath  sounds.  Cardiovascular: Heart rate and rhythm are normal . No peripheral edema.  Abdomen: The abdomen is soft and nontender. No masses are palpated. No CVA tenderness. No hernias are palpable. No hepatosplenomegaly noted.  Rectal: Rectal exam demonstrates normal sphincter tone, no tenderness and no masses. Prostate size is estimated to be 45 g. Prostate exam is abnormal likely related to his recent biopsy. However, there is significant induration of the left side of the prostate extending from the mid to apical gland is not palpable on the left. The left seminal vesicle is nonpalpable. The right seminal vesicle is nonpalpable. The perineum is normal on inspection.  Lymphatics: The femoral and inguinal nodes are not enlarged or tender.  Skin: Normal skin turgor, no visible rash and no visible skin lesions.  Neuro/Psych:. Mood and affect are appropriate.    Assessment Assessed  1. Adenocarcinoma of prostate (C61) 2. Erectile dysfunction (N52.9)    Discussion/Summary 1. Prostate cancer:  He has elected to proceed with surgical treatment and will undergo a RAL radical prostatectomy and BPLND.

## 2015-01-03 MED ORDER — DEXTROSE 5 % IV SOLN
3.0000 g | INTRAVENOUS | Status: AC
  Administered 2015-01-04: 3 g via INTRAVENOUS
  Filled 2015-01-03: qty 3000

## 2015-01-03 NOTE — Anesthesia Preprocedure Evaluation (Addendum)
Anesthesia Evaluation  Patient identified by MRN, date of birth, ID band Patient awake    Reviewed: Allergy & Precautions, H&P , NPO status , Patient's Chart, lab work & pertinent test results, reviewed documented beta blocker date and time , Unable to perform ROS - Chart review only  History of Anesthesia Complications Negative for: history of anesthetic complications  Airway Mallampati: II  TM Distance: >3 FB Neck ROM: full    Dental no notable dental hx.    Pulmonary neg pulmonary ROS,  breath sounds clear to auscultation  Pulmonary exam normal       Cardiovascular hypertension, Normal cardiovascular examRhythm:regular Rate:Normal  Previous Echo from 2006 reviewed, no signs of thrombus or embolic source, normal echo   Neuro/Psych  Headaches (recent ED admission with neuro consult, possible complex migraines, improved with BP lowering), TIA (remote history, 15 years ago)negative psych ROS   GI/Hepatic negative GI ROS, Neg liver ROS,   Endo/Other  negative endocrine ROSdiabetes, Well Controlled, Type 2  Renal/GU negative Renal ROS     Musculoskeletal   Abdominal   Peds  Hematology negative hematology ROS (+)   Anesthesia Other Findings NPO appropriate, no meds today, allergies reviewed Denies active cardiac or pulmonary symptoms, METS > 4 No recent congestive cough or symptoms of upper respiratory infection   Reproductive/Obstetrics negative OB ROS                           Anesthesia Physical Anesthesia Plan  ASA: II  Anesthesia Plan: General ETT   Post-op Pain Management:    Induction:   Airway Management Planned:   Additional Equipment:   Intra-op Plan:   Post-operative Plan:   Informed Consent: I have reviewed the patients History and Physical, chart, labs and discussed the procedure including the risks, benefits and alternatives for the proposed anesthesia with the patient  or authorized representative who has indicated his/her understanding and acceptance.   Dental Advisory Given  Plan Discussed with: Anesthesiologist  Anesthesia Plan Comments:         Anesthesia Quick Evaluation

## 2015-01-04 ENCOUNTER — Inpatient Hospital Stay (HOSPITAL_COMMUNITY): Payer: 59 | Admitting: Anesthesiology

## 2015-01-04 ENCOUNTER — Inpatient Hospital Stay (HOSPITAL_COMMUNITY)
Admission: RE | Admit: 2015-01-04 | Discharge: 2015-01-05 | DRG: 708 | Disposition: A | Discharge status: Home / Self Care | Payer: 59 | Source: Ambulatory Visit | Attending: Urology | Admitting: Urology

## 2015-01-04 ENCOUNTER — Encounter (HOSPITAL_COMMUNITY): Payer: Self-pay

## 2015-01-04 ENCOUNTER — Encounter (HOSPITAL_COMMUNITY)
Admission: RE | Disposition: A | Discharge status: Home / Self Care | Payer: Self-pay | Source: Ambulatory Visit | Attending: Urology

## 2015-01-04 DIAGNOSIS — E119 Type 2 diabetes mellitus without complications: Secondary | ICD-10-CM | POA: Diagnosis present

## 2015-01-04 DIAGNOSIS — Z8673 Personal history of transient ischemic attack (TIA), and cerebral infarction without residual deficits: Secondary | ICD-10-CM | POA: Diagnosis not present

## 2015-01-04 DIAGNOSIS — C61 Malignant neoplasm of prostate: Principal | ICD-10-CM | POA: Diagnosis present

## 2015-01-04 DIAGNOSIS — I10 Essential (primary) hypertension: Secondary | ICD-10-CM | POA: Diagnosis present

## 2015-01-04 DIAGNOSIS — Z800 Family history of malignant neoplasm of digestive organs: Secondary | ICD-10-CM

## 2015-01-04 DIAGNOSIS — Z8546 Personal history of malignant neoplasm of prostate: Secondary | ICD-10-CM | POA: Diagnosis present

## 2015-01-04 HISTORY — PX: LYMPHADENECTOMY: SHX5960

## 2015-01-04 HISTORY — PX: ROBOT ASSISTED LAPAROSCOPIC RADICAL PROSTATECTOMY: SHX5141

## 2015-01-04 LAB — TYPE AND SCREEN
ABO/RH(D): O POS
Antibody Screen: NEGATIVE

## 2015-01-04 LAB — GLUCOSE, CAPILLARY
Glucose-Capillary: 133 mg/dL — ABNORMAL HIGH (ref 65–99)
Glucose-Capillary: 163 mg/dL — ABNORMAL HIGH (ref 65–99)
Glucose-Capillary: 163 mg/dL — ABNORMAL HIGH (ref 65–99)
Glucose-Capillary: 202 mg/dL — ABNORMAL HIGH (ref 65–99)

## 2015-01-04 LAB — ABO/RH: ABO/RH(D): O POS

## 2015-01-04 LAB — HEMOGLOBIN AND HEMATOCRIT, BLOOD
HCT: 41 % (ref 39.0–52.0)
HEMOGLOBIN: 13.4 g/dL (ref 13.0–17.0)

## 2015-01-04 SURGERY — ROBOTIC ASSISTED LAPAROSCOPIC RADICAL PROSTATECTOMY LEVEL 2
Anesthesia: General

## 2015-01-04 MED ORDER — MORPHINE SULFATE 2 MG/ML IJ SOLN
2.0000 mg | INTRAMUSCULAR | Status: DC | PRN
  Administered 2015-01-04: 2 mg via INTRAVENOUS
  Filled 2015-01-04: qty 1

## 2015-01-04 MED ORDER — MIDAZOLAM HCL 2 MG/2ML IJ SOLN
INTRAMUSCULAR | Status: AC
  Filled 2015-01-04: qty 2

## 2015-01-04 MED ORDER — PROPOFOL 10 MG/ML IV BOLUS
INTRAVENOUS | Status: AC
  Filled 2015-01-04: qty 20

## 2015-01-04 MED ORDER — HYDROMORPHONE HCL 2 MG/ML IJ SOLN
INTRAMUSCULAR | Status: AC
  Filled 2015-01-04: qty 1

## 2015-01-04 MED ORDER — SODIUM CHLORIDE 0.9 % IV BOLUS (SEPSIS)
1000.0000 mL | Freq: Once | INTRAVENOUS | Status: AC
  Administered 2015-01-04: 1000 mL via INTRAVENOUS

## 2015-01-04 MED ORDER — LABETALOL HCL 5 MG/ML IV SOLN
INTRAVENOUS | Status: DC | PRN
  Administered 2015-01-04: 5 mg via INTRAVENOUS

## 2015-01-04 MED ORDER — ALBUTEROL SULFATE (2.5 MG/3ML) 0.083% IN NEBU: 3.0000 mL | INHALATION_SOLUTION | Freq: Every day | RESPIRATORY_TRACT | Status: DC | PRN

## 2015-01-04 MED ORDER — LACTATED RINGERS IV SOLN
INTRAVENOUS | Status: DC | PRN
  Administered 2015-01-04: 1 mL

## 2015-01-04 MED ORDER — FENTANYL CITRATE (PF) 100 MCG/2ML IJ SOLN
25.0000 ug | INTRAMUSCULAR | Status: DC | PRN
  Administered 2015-01-04 (×2): 25 ug via INTRAVENOUS

## 2015-01-04 MED ORDER — DOCUSATE SODIUM 100 MG PO CAPS
100.0000 mg | ORAL_CAPSULE | Freq: Two times a day (BID) | ORAL | Status: DC
  Administered 2015-01-04: 100 mg via ORAL
  Filled 2015-01-04 (×2): qty 1

## 2015-01-04 MED ORDER — FENTANYL CITRATE (PF) 100 MCG/2ML IJ SOLN
INTRAMUSCULAR | Status: DC | PRN
  Administered 2015-01-04 (×2): 50 ug via INTRAVENOUS
  Administered 2015-01-04: 100 ug via INTRAVENOUS
  Administered 2015-01-04: 50 ug via INTRAVENOUS

## 2015-01-04 MED ORDER — BACITRACIN-NEOMYCIN-POLYMYXIN 400-5-5000 EX OINT
1.0000 "application " | TOPICAL_OINTMENT | Freq: Three times a day (TID) | CUTANEOUS | Status: DC | PRN
  Administered 2015-01-04: 1 via TOPICAL
  Filled 2015-01-04: qty 1

## 2015-01-04 MED ORDER — BUPIVACAINE-EPINEPHRINE (PF) 0.25% -1:200000 IJ SOLN
INTRAMUSCULAR | Status: AC
  Filled 2015-01-04: qty 30

## 2015-01-04 MED ORDER — PROPOFOL 10 MG/ML IV BOLUS
INTRAVENOUS | Status: DC | PRN
  Administered 2015-01-04: 200 mg via INTRAVENOUS

## 2015-01-04 MED ORDER — CEFAZOLIN SODIUM 1-5 GM-% IV SOLN
1.0000 g | Freq: Three times a day (TID) | INTRAVENOUS | Status: AC
  Administered 2015-01-04 – 2015-01-05 (×2): 1 g via INTRAVENOUS
  Filled 2015-01-04 (×2): qty 50

## 2015-01-04 MED ORDER — KETOROLAC TROMETHAMINE 15 MG/ML IJ SOLN
15.0000 mg | Freq: Four times a day (QID) | INTRAMUSCULAR | Status: DC
  Administered 2015-01-04 – 2015-01-05 (×3): 15 mg via INTRAVENOUS
  Filled 2015-01-04 (×3): qty 1

## 2015-01-04 MED ORDER — FENTANYL CITRATE (PF) 250 MCG/5ML IJ SOLN
INTRAMUSCULAR | Status: AC
  Filled 2015-01-04: qty 5

## 2015-01-04 MED ORDER — INSULIN ASPART 100 UNIT/ML ~~LOC~~ SOLN
0.0000 [IU] | SUBCUTANEOUS | Status: DC
  Administered 2015-01-04: 5 [IU] via SUBCUTANEOUS
  Administered 2015-01-04: 3 [IU] via SUBCUTANEOUS
  Administered 2015-01-05: 2 [IU] via SUBCUTANEOUS
  Administered 2015-01-05: 5 [IU] via SUBCUTANEOUS
  Administered 2015-01-05: 2 [IU] via SUBCUTANEOUS

## 2015-01-04 MED ORDER — FENTANYL CITRATE (PF) 100 MCG/2ML IJ SOLN
INTRAMUSCULAR | Status: AC
  Filled 2015-01-04: qty 2

## 2015-01-04 MED ORDER — HEPARIN SODIUM (PORCINE) 1000 UNIT/ML IJ SOLN
INTRAMUSCULAR | Status: AC
  Filled 2015-01-04: qty 1

## 2015-01-04 MED ORDER — DIPHENHYDRAMINE HCL 50 MG/ML IJ SOLN
12.5000 mg | Freq: Four times a day (QID) | INTRAMUSCULAR | Status: DC | PRN
  Administered 2015-01-04: 25 mg via INTRAVENOUS
  Filled 2015-01-04: qty 1

## 2015-01-04 MED ORDER — ROCURONIUM BROMIDE 100 MG/10ML IV SOLN
INTRAVENOUS | Status: DC | PRN
  Administered 2015-01-04: 10 mg via INTRAVENOUS
  Administered 2015-01-04: 20 mg via INTRAVENOUS
  Administered 2015-01-04: 50 mg via INTRAVENOUS
  Administered 2015-01-04 (×2): 10 mg via INTRAVENOUS

## 2015-01-04 MED ORDER — PROMETHAZINE HCL 25 MG/ML IJ SOLN: 6.2500 mg | INTRAMUSCULAR | Status: DC | PRN

## 2015-01-04 MED ORDER — GLYCOPYRROLATE 0.2 MG/ML IJ SOLN
INTRAMUSCULAR | Status: DC | PRN
  Administered 2015-01-04: .6 mg via INTRAVENOUS
  Administered 2015-01-04: 0.2 mg via INTRAVENOUS

## 2015-01-04 MED ORDER — HYDROMORPHONE HCL 1 MG/ML IJ SOLN
INTRAMUSCULAR | Status: DC | PRN
  Administered 2015-01-04: 1 mg via INTRAVENOUS
  Administered 2015-01-04 (×2): 0.5 mg via INTRAVENOUS

## 2015-01-04 MED ORDER — LACTATED RINGERS IV SOLN
INTRAVENOUS | Status: DC | PRN
Start: 2015-01-04 — End: 2015-01-04
  Administered 2015-01-04 (×3): via INTRAVENOUS

## 2015-01-04 MED ORDER — EPHEDRINE SULFATE 50 MG/ML IJ SOLN
INTRAMUSCULAR | Status: DC | PRN
  Administered 2015-01-04: 10 mg via INTRAVENOUS
  Administered 2015-01-04: 5 mg via INTRAVENOUS
  Administered 2015-01-04: 15 mg via INTRAVENOUS
  Administered 2015-01-04: 10 mg via INTRAVENOUS

## 2015-01-04 MED ORDER — SULFAMETHOXAZOLE-TRIMETHOPRIM 800-160 MG PO TABS: 1.0000 | ORAL_TABLET | Freq: Two times a day (BID) | ORAL | Status: DC

## 2015-01-04 MED ORDER — STERILE WATER FOR IRRIGATION IR SOLN
Status: DC | PRN
  Administered 2015-01-04: 3000 mL

## 2015-01-04 MED ORDER — DEXAMETHASONE SODIUM PHOSPHATE 10 MG/ML IJ SOLN
INTRAMUSCULAR | Status: DC | PRN
  Administered 2015-01-04: 10 mg via INTRAVENOUS

## 2015-01-04 MED ORDER — ONDANSETRON HCL 4 MG/2ML IJ SOLN
INTRAMUSCULAR | Status: DC | PRN
  Administered 2015-01-04: 4 mg via INTRAVENOUS

## 2015-01-04 MED ORDER — DIPHENHYDRAMINE HCL 12.5 MG/5ML PO ELIX: 12.5000 mg | ORAL_SOLUTION | Freq: Four times a day (QID) | ORAL | Status: DC | PRN

## 2015-01-04 MED ORDER — NEOSTIGMINE METHYLSULFATE 10 MG/10ML IV SOLN
INTRAVENOUS | Status: DC | PRN
  Administered 2015-01-04: 4 mg via INTRAVENOUS

## 2015-01-04 MED ORDER — KCL IN DEXTROSE-NACL 20-5-0.45 MEQ/L-%-% IV SOLN
INTRAVENOUS | Status: DC
  Administered 2015-01-04 – 2015-01-05 (×2): via INTRAVENOUS
  Filled 2015-01-04 (×4): qty 1000

## 2015-01-04 MED ORDER — LIDOCAINE HCL (CARDIAC) 20 MG/ML IV SOLN
INTRAVENOUS | Status: DC | PRN
  Administered 2015-01-04: 50 mg via INTRAVENOUS

## 2015-01-04 MED ORDER — ACETAMINOPHEN 325 MG PO TABS: 650.0000 mg | ORAL_TABLET | ORAL | Status: DC | PRN

## 2015-01-04 MED ORDER — HYDROCHLOROTHIAZIDE 25 MG PO TABS
12.5000 mg | ORAL_TABLET | Freq: Every day | ORAL | Status: DC
  Administered 2015-01-04: 12.5 mg via ORAL
  Filled 2015-01-04 (×2): qty 1

## 2015-01-04 MED ORDER — HYDROCODONE-ACETAMINOPHEN 5-325 MG PO TABS: 1.0000 | ORAL_TABLET | Freq: Four times a day (QID) | ORAL | Status: DC | PRN

## 2015-01-04 MED ORDER — SODIUM CHLORIDE 0.9 % IR SOLN
Status: DC | PRN
  Administered 2015-01-04: 1000 mL

## 2015-01-04 MED ORDER — MIDAZOLAM HCL 5 MG/5ML IJ SOLN
INTRAMUSCULAR | Status: DC | PRN
  Administered 2015-01-04: 2 mg via INTRAVENOUS

## 2015-01-04 SURGICAL SUPPLY — 52 items
CABLE HIGH FREQUENCY MONO STRZ (ELECTRODE) ×4 IMPLANT
CATH FOLEY 2WAY SLVR 18FR 30CC (CATHETERS) ×4 IMPLANT
CATH ROBINSON RED A/P 16FR (CATHETERS) ×4 IMPLANT
CATH ROBINSON RED A/P 8FR (CATHETERS) ×4 IMPLANT
CATH TIEMANN FOLEY 18FR 5CC (CATHETERS) ×4 IMPLANT
CHLORAPREP W/TINT 26ML (MISCELLANEOUS) ×4 IMPLANT
CLIP LIGATING HEM O LOK PURPLE (MISCELLANEOUS) ×12 IMPLANT
CLOTH BEACON ORANGE TIMEOUT ST (SAFETY) ×4 IMPLANT
COVER SURGICAL LIGHT HANDLE (MISCELLANEOUS) ×4 IMPLANT
COVER TIP SHEARS 8 DVNC (MISCELLANEOUS) ×2 IMPLANT
COVER TIP SHEARS 8MM DA VINCI (MISCELLANEOUS) ×2
CUTTER ECHEON FLEX ENDO 45 340 (ENDOMECHANICALS) ×4 IMPLANT
DECANTER SPIKE VIAL GLASS SM (MISCELLANEOUS) IMPLANT
DRAPE SURG IRRIG POUCH 19X23 (DRAPES) ×4 IMPLANT
DRSG TEGADERM 4X4.75 (GAUZE/BANDAGES/DRESSINGS) ×4 IMPLANT
DRSG TEGADERM 6X8 (GAUZE/BANDAGES/DRESSINGS) ×8 IMPLANT
ELECT REM PT RETURN 9FT ADLT (ELECTROSURGICAL) ×4
ELECTRODE REM PT RTRN 9FT ADLT (ELECTROSURGICAL) ×2 IMPLANT
GLOVE BIO SURGEON STRL SZ 6.5 (GLOVE) ×3 IMPLANT
GLOVE BIO SURGEONS STRL SZ 6.5 (GLOVE) ×1
GLOVE BIOGEL M STRL SZ7.5 (GLOVE) ×8 IMPLANT
GOWN STRL REUS W/TWL LRG LVL3 (GOWN DISPOSABLE) ×12 IMPLANT
HOLDER FOLEY CATH W/STRAP (MISCELLANEOUS) ×4 IMPLANT
IV LACTATED RINGERS 1000ML (IV SOLUTION) IMPLANT
KIT ACCESSORY DA VINCI DISP (KITS) ×2
KIT ACCESSORY DVNC DISP (KITS) ×2 IMPLANT
LIQUID BAND (GAUZE/BANDAGES/DRESSINGS) IMPLANT
MANIFOLD NEPTUNE II (INSTRUMENTS) ×4 IMPLANT
NDL SAFETY ECLIPSE 18X1.5 (NEEDLE) ×2 IMPLANT
NEEDLE HYPO 18GX1.5 SHARP (NEEDLE) ×2
PACK ROBOT UROLOGY CUSTOM (CUSTOM PROCEDURE TRAY) ×4 IMPLANT
RELOAD GREEN ECHELON 45 (STAPLE) ×4 IMPLANT
SET TUBE IRRIG SUCTION NO TIP (IRRIGATION / IRRIGATOR) ×4 IMPLANT
SHEET LAVH (DRAPES) IMPLANT
SOLUTION ELECTROLUBE (MISCELLANEOUS) ×4 IMPLANT
SUT ETHILON 3 0 PS 1 (SUTURE) ×4 IMPLANT
SUT MNCRL 3 0 RB1 (SUTURE) ×2 IMPLANT
SUT MNCRL 3 0 VIOLET RB1 (SUTURE) ×2 IMPLANT
SUT MNCRL AB 4-0 PS2 18 (SUTURE) ×8 IMPLANT
SUT MONOCRYL 3 0 RB1 (SUTURE) ×4
SUT VIC AB 0 CT1 27 (SUTURE) ×4
SUT VIC AB 0 CT1 27XBRD ANTBC (SUTURE) ×2 IMPLANT
SUT VIC AB 0 UR5 27 (SUTURE) ×4 IMPLANT
SUT VIC AB 2-0 SH 27 (SUTURE) ×4
SUT VIC AB 2-0 SH 27X BRD (SUTURE) ×4 IMPLANT
SUT VIC AB 3-0 SH 27 (SUTURE) ×6
SUT VIC AB 3-0 SH 27X BRD (SUTURE) ×6 IMPLANT
SUT VICRYL 0 UR6 27IN ABS (SUTURE) ×4 IMPLANT
SYR 27GX1/2 1ML LL SAFETY (SYRINGE) ×4 IMPLANT
TOWEL OR 17X26 10 PK STRL BLUE (TOWEL DISPOSABLE) ×4 IMPLANT
TOWEL OR NON WOVEN STRL DISP B (DISPOSABLE) ×4 IMPLANT
WATER STERILE IRR 1500ML POUR (IV SOLUTION) IMPLANT

## 2015-01-04 NOTE — Anesthesia Postprocedure Evaluation (Signed)
Anesthesia Post Note  Patient: Jose Bishop  Procedure(s) Performed: Procedure(s) (LRB): ROBOTIC ASSISTED LAPAROSCOPIC RADICAL PROSTATECTOMY LEVEL 2 (N/A) BILATERAL LYMPHADENECTOMY (Bilateral)  Anesthesia type: General  Patient location: PACU  Post pain: Pain level controlled and Adequate analgesia  Post assessment: Post-op Vital signs reviewed, Patient's Cardiovascular Status Stable, Respiratory Function Stable, Patent Airway and Pain level controlled  Last Vitals:  Filed Vitals:   01/04/15 1545  BP: 144/79  Pulse: 61  Temp:   Resp: 16    Post vital signs: Reviewed and stable  Level of consciousness: awake, alert  and oriented  Complications: No apparent anesthesia complications

## 2015-01-04 NOTE — Transfer of Care (Signed)
Immediate Anesthesia Transfer of Care Note  Patient: Jose Bishop  Procedure(s) Performed: Procedure(s): ROBOTIC ASSISTED LAPAROSCOPIC RADICAL PROSTATECTOMY LEVEL 2 (N/A) BILATERAL LYMPHADENECTOMY (Bilateral)  Patient Location: PACU  Anesthesia Type:General  Level of Consciousness: sedated  Airway & Oxygen Therapy: Patient Spontanous Breathing and Patient connected to face mask oxygen  Post-op Assessment: Report given to RN and Post -op Vital signs reviewed and stable  Post vital signs: Reviewed and stable  Last Vitals:  Filed Vitals:   01/04/15 0914  BP: 141/97  Pulse: 51  Temp: 36.6 C  Resp: 18    Complications: No apparent anesthesia complications

## 2015-01-04 NOTE — Anesthesia Procedure Notes (Signed)
Procedure Name: Intubation Date/Time: 01/04/2015 11:03 AM Performed by: Anne Fu Pre-anesthesia Checklist: Patient identified, Emergency Drugs available, Suction available, Patient being monitored and Timeout performed Patient Re-evaluated:Patient Re-evaluated prior to inductionOxygen Delivery Method: Circle system utilized Preoxygenation: Pre-oxygenation with 100% oxygen Intubation Type: IV induction Ventilation: Mask ventilation without difficulty Laryngoscope Size: Mac and 4 Grade View: Grade I Tube type: Oral Tube size: 7.5 mm Number of attempts: 1 Airway Equipment and Method: Stylet Placement Confirmation: ETT inserted through vocal cords under direct vision,  positive ETCO2,  CO2 detector and breath sounds checked- equal and bilateral Secured at: 23 cm Tube secured with: Tape Dental Injury: Teeth and Oropharynx as per pre-operative assessment

## 2015-01-04 NOTE — Op Note (Signed)
Preoperative diagnosis: Clinically localized adenocarcinoma of the prostate (clinical stage T1c N0 Mx)  Postoperative diagnosis: Clinically localized adenocarcinoma of the prostate (clinical stage T1c N0 Mx)  Procedure:  1. Robotic assisted laparoscopic radical prostatectomy (bilateral nerve sparing) 2. Bilateral robotic assisted laparoscopic pelvic lymphadenectomy  Surgeon: Pryor Curia. M.D.  Assistant: Debbrah Alar, PA-C  Anesthesia: General  Complications: None  EBL: 500 mL  IVF:  1800 mL crystalloid  Specimens: 1. Prostate and seminal vesicles 2. Right pelvic lymph nodes 3. Left pelvic lymph nodes  Disposition of specimens: Pathology  Drains: 1. 20 Fr coude catheter 2. # 19 Blake pelvic drain  Indication: Jose Bishop is a 49 y.o. year old patient with clinically localized prostate cancer.  After a thorough review of the management options for treatment of prostate cancer, he elected to proceed with surgical therapy and the above procedure(s).  We have discussed the potential benefits and risks of the procedure, side effects of the proposed treatment, the likelihood of the patient achieving the goals of the procedure, and any potential problems that might occur during the procedure or recuperation. Informed consent has been obtained.  Description of procedure:  The patient was taken to the operating room and a general anesthetic was administered. He was given preoperative antibiotics, placed in the dorsal lithotomy position, and prepped and draped in the usual sterile fashion. Next a preoperative timeout was performed. A urethral catheter was placed into the bladder and a site was selected near the umbilicus for placement of the camera port. This was placed using a standard open Hassan technique which allowed entry into the peritoneal cavity under direct vision and without difficulty.  The umbilical mesh from his prior umbilical hernia repair was noted inferiorly  and there were omental adhesions that were palpable.  However, there was free area into the peritoneum as well. A 12 mm port was placed and a pneumoperitoneum established. The camera was then used to inspect the abdomen and there was no evidence of any intra-abdominal injuries or other abnormalities.  The only adhesions noted were omental adhesions at the umbilicus just lateral to the camera port site. The remaining abdominal ports were then placed. 8 mm robotic ports were placed in the right lower quadrant, left lower quadrant, and far left lateral abdominal wall. A 5 mm port was placed in the right upper quadrant and a 12 mm port was placed in the right lateral abdominal wall for laparoscopic assistance. All ports were placed under direct vision without difficulty. The surgical cart was then docked.   Utilizing the cautery scissors, the bladder was reflected posteriorly allowing entry into the space of Retzius and identification of the endopelvic fascia and prostate. The periprostatic fat was then removed from the prostate allowing full exposure of the endopelvic fascia. The endopelvic fascia was then incised from the apex back to the base of the prostate bilaterally and the underlying levator muscle fibers were swept laterally off the prostate thereby isolating the dorsal venous complex. The dorsal vein was then stapled and divided with a 45 mm Flex Echelon stapler. Attention then turned to the bladder neck which was divided anteriorly thereby allowing entry into the bladder and exposure of the urethral catheter. The catheter balloon was deflated and the catheter was brought into the operative field and used to retract the prostate anteriorly. The posterior bladder neck was then examined and was divided allowing further dissection between the bladder and prostate posteriorly until the vasa deferentia and seminal vessels were  identified. The vasa deferentia were isolated, divided, and lifted anteriorly. The  seminal vesicles were dissected down to their tips with care to control the seminal vascular arterial blood supply. These structures were then lifted anteriorly and the space between Denonvillier's fascia and the anterior rectum was developed with a combination of sharp and blunt dissection. This isolated the vascular pedicles of the prostate.  The lateral prostatic fascia was then sharply incised allowing release of the neurovascular bundles bilaterally. The vascular pedicles of the prostate were then ligated with Weck clips between the prostate and neurovascular bundles and divided with sharp cold scissor dissection resulting in neurovascular bundle preservation.  On the left a partial nerve spare was performed considering the patient's preoperative MRI findings. The neurovascular bundles were then separated off the apex of the prostate and urethra bilaterally.  The urethra was then sharply transected allowing the prostate specimen to be disarticulated. The pelvis was copiously irrigated and hemostasis was ensured. There was no evidence for rectal injury.  Attention then turned to the right pelvic sidewall. The fibrofatty tissue between the external iliac vein, confluence of the iliac vessels, hypogastric artery, and Cooper's ligament was dissected free from the pelvic sidewall with care to preserve the obturator nerve. Weck clips were used for lymphostasis and hemostasis. An identical procedure was performed on the contralateral side and the lymphatic packets were removed for permanent pathologic analysis.  Attention then turned to the urethral anastomosis. A 2-0 Vicryl slip knot was placed between Denonvillier's fascia, the posterior bladder neck, and the posterior urethra to reapproximate these structures. A double-armed 3-0 Monocryl suture was then used to perform a 360 running tension-free anastomosis between the bladder neck and urethra. A new urethral catheter was then placed into the bladder and  irrigated. There were no blood clots within the bladder and the anastomosis appeared to be watertight. A #19 Blake drain was then brought through the left lateral 8 mm port site and positioned appropriately within the pelvis. It was secured to the skin with a nylon suture. The surgical cart was then undocked. The right lateral 12 mm port site was closed at the fascial level with a 0 Vicryl suture placed laparoscopically. All remaining ports were then removed under direct vision. The prostate specimen was removed intact within the Endopouch retrieval bag via the periumbilical camera port site. This fascial opening was closed with two running 0 Vicryl sutures. 0.25% Marcaine was then injected into all port sites and all incisions were reapproximated at the skin level with 4-0 Monocryl subcuticular sutures and Dermabond. The patient appeared to tolerate the procedure well and without complications. The patient was able to be extubated and transferred to the recovery unit in satisfactory condition.   Pryor Curia MD

## 2015-01-04 NOTE — Progress Notes (Signed)
Patient ID: Jose Bishop, male   DOB: 01/25/1966, 49 y.o.   MRN: [id number]  Post-op note  Subjective: The patient is doing well.  No complaints except bladder spasms as expected.  Objective: Vital signs in last 24 hours: Temp:  [97 F (36.1 C)-97.9 F (36.6 C)] 97.5 F (36.4 C) (07/11 1614) Pulse Rate:  [51-86] 51 (07/11 1614) Resp:  [15-18] 16 (07/11 1614) BP: (141-157)/(75-101) 150/90 mmHg (07/11 1614) SpO2:  [96 %-100 %] 98 % (07/11 1614) Weight:  [122.018 kg (269 lb)] 122.018 kg (269 lb) (07/11 0925)  Intake/Output from previous day:   Intake/Output this shift: Total I/O In: 2200 [I.V.:2200] Out: 835 [Urine:75; Drains:60; Blood:700]  Physical Exam:  General: Alert and oriented. Abdomen: Soft, Nondistended. Incisions: Clean and dry. GU: Urine clear  Lab Results:  Recent Labs  01/04/15 1456  HGB 13.4  HCT 41.0    Assessment/Plan: POD#0   1) Continue to monitor 2) IS and ambulate tonight   Roxy Horseman, Brooke Bonito. MD   LOS: 0 days   Trea Carnegie,LES 01/04/2015, 4:40 PM

## 2015-01-04 NOTE — Discharge Instructions (Signed)

## 2015-01-05 ENCOUNTER — Encounter (HOSPITAL_COMMUNITY): Payer: Self-pay | Admitting: Urology

## 2015-01-05 LAB — GLUCOSE, CAPILLARY
Glucose-Capillary: 121 mg/dL — ABNORMAL HIGH (ref 65–99)
Glucose-Capillary: 140 mg/dL — ABNORMAL HIGH (ref 65–99)
Glucose-Capillary: 213 mg/dL — ABNORMAL HIGH (ref 65–99)

## 2015-01-05 LAB — HEMOGLOBIN AND HEMATOCRIT, BLOOD
HCT: 40.3 % (ref 39.0–52.0)
HEMOGLOBIN: 13.2 g/dL (ref 13.0–17.0)

## 2015-01-05 MED ORDER — HYDROCODONE-ACETAMINOPHEN 5-325 MG PO TABS: 1.0000 | ORAL_TABLET | Freq: Four times a day (QID) | ORAL | Status: DC | PRN

## 2015-01-05 MED ORDER — BISACODYL 10 MG RE SUPP
10.0000 mg | Freq: Once | RECTAL | Status: AC
  Administered 2015-01-05: 10 mg via RECTAL
  Filled 2015-01-05: qty 1

## 2015-01-05 NOTE — Progress Notes (Signed)
Informed on call that patient's jaws/ face was swelling up.  Right side greater than left.  Provider stated ok to give a dose of Benadryl and to continue to monitor patient for worsening of swelling or if breathing became compromised.  Applied ice pack as well.  Will continue to monitor patient.

## 2015-01-05 NOTE — Progress Notes (Signed)
Patient ID: Jose Bishop, male   DOB: [birth year], 49 y.o.   MRN: [id number]  1 Day Post-Op Subjective: The patient is doing well.  No nausea or vomiting. Pain is adequately controlled.  Objective: Vital signs in last 24 hours: Temp:  [97 F (36.1 C)-98.4 F (36.9 C)] 98 F (36.7 C) (07/12 0436) Pulse Rate:  [49-86] 49 (07/12 0436) Resp:  [15-18] 16 (07/12 0436) BP: (126-157)/(72-101) 126/72 mmHg (07/12 0436) SpO2:  [96 %-100 %] 97 % (07/12 0436) Weight:  [122.018 kg (269 lb)] 122.018 kg (269 lb) (07/11 0925)  Intake/Output from previous day: 07/11 0701 - 07/12 0700 In: 4696 [P.O.:240; I.V.:3260; IV Piggyback:50] Out: 2952 [Urine:3520; Drains:170; Blood:700] Intake/Output this shift: Total I/O In: 760 [I.V.:760] Out: 3305 [WUXLK:4401; Drains:110]  Physical Exam:  General: Alert and oriented. CV: RRR Lungs: Clear bilaterally. GI: Soft, Nondistended. Incisions: Clean, dry, and intact Urine: Clear Extremities: Nontender, no erythema, no edema.  Lab Results:  Recent Labs  01/04/15 1456 01/05/15 0429  HGB 13.4 13.2  HCT 41.0 40.3      Assessment/Plan: POD# 1 s/p robotic prostatectomy.  1) SL IVF 2) Ambulate, Incentive spirometry 3) Transition to oral pain medication 4) Dulcolax suppository 5) D/C pelvic drain 6) Plan for likely discharge later today   Pryor Curia. MD   LOS: 1 day   Jose Bishop,LES 01/05/2015, 6:28 AM

## 2015-01-05 NOTE — Discharge Summary (Signed)
  Date of admission: 01/04/2015  Date of discharge: 01/05/2015  Admission diagnosis: Prostate Cancer  Discharge diagnosis: Prostate Cancer  History and Physical: For full details, please see admission history and physical. Briefly, KAINON VARADY is a 49 y.o. gentleman with localized prostate cancer.  After discussing management/treatment options, he elected to proceed with surgical treatment.  Hospital Course: STOKELY JEANCHARLES was taken to the operating room on 01/04/2015 and underwent a robotic assisted laparoscopic radical prostatectomy. He tolerated this procedure well and without complications. Postoperatively, he was able to be transferred to a regular hospital room following recovery from anesthesia.  He was able to begin ambulating the night of surgery. He remained hemodynamically stable overnight.  He had excellent urine output with appropriately minimal output from his pelvic drain and his pelvic drain was removed on POD #1.  He was transitioned to oral pain medication, tolerated a clear liquid diet, and had met all discharge criteria and was able to be discharged home later on POD#1.  Laboratory values:  Recent Labs  01/04/15 1456 01/05/15 0429  HGB 13.4 13.2  HCT 41.0 40.3    Disposition: Home  Discharge instruction: He was instructed to be ambulatory but to refrain from heavy lifting, strenuous activity, or driving. He was instructed on urethral catheter care.  Discharge medications:     Medication List    STOP taking these medications        BLACK [place]ALNUT POLLEN [place]     cyclobenzaprine 5 MG tablet  Commonly known as:  FLEXERIL     ibuprofen 800 MG tablet  Commonly known as:  ADVIL,MOTRIN     multivitamin with minerals Tabs tablet     OVER THE COUNTER MEDICATION     SUPER B COMPLEX PO     [place]      TAKE these medications        acetaminophen 500 MG tablet  Commonly known as:  TYLENOL  Take 1,000 mg by mouth every 6 (six) hours as needed for moderate  pain.     hydrochlorothiazide 12.5 MG tablet  Commonly known as:  HYDRODIURIL  Take 1 tablet (12.5 mg total) by mouth daily.     HYDROcodone-acetaminophen 5-325 MG per tablet  Commonly known as:  NORCO  Take 1-2 tablets by mouth every 6 (six) hours as needed.     MAGNESIUM PO  Take 1 tablet by mouth daily.     metFORMIN 500 MG tablet  Commonly known as:  GLUCOPHAGE  Take 500 mg by mouth every morning.     PROAIR RESPICLICK 520 (90 BASE) MCG/ACT Aepb  Generic drug:  Albuterol Sulfate  Inhale 2 puffs into the lungs daily as needed (Shortness of breath).     sulfamethoxazole-trimethoprim 800-160 MG per tablet  Commonly known as:  BACTRIM DS,SEPTRA DS  Take 1 tablet by mouth 2 (two) times daily. Start the day prior to foley removal appointment        Followup: He will followup in 1 week for catheter removal and to discuss his surgical pathology results.

## 2015-04-23 ENCOUNTER — Encounter: Payer: Self-pay | Admitting: Gastroenterology

## 2015-06-25 ENCOUNTER — Encounter: Payer: Self-pay | Admitting: Gastroenterology

## 2015-06-25 ENCOUNTER — Ambulatory Visit (INDEPENDENT_AMBULATORY_CARE_PROVIDER_SITE_OTHER): Payer: 59 | Admitting: Gastroenterology

## 2015-06-25 VITALS — BP 140/80 | HR 68 | Ht 75.0 in | Wt 267.0 lb

## 2015-06-25 DIAGNOSIS — Z1211 Encounter for screening for malignant neoplasm of colon: Secondary | ICD-10-CM

## 2015-06-25 MED ORDER — NA SULFATE-K SULFATE-MG SULF 17.5-3.13-1.6 GM/177ML PO SOLN: 1.0000 | Freq: Once | ORAL | Status: DC

## 2015-06-25 NOTE — Patient Instructions (Signed)
You will be set up for a colonoscopy for colon cancer screening. 

## 2015-06-25 NOTE — Progress Notes (Signed)
HPI: This is a  Very pleasant 48 yo AA man  who was referred to me by Everardo Beals, NP  to evaluate  Colon cancer screening .    Chief complaint is father had colon cancer, he recently had prostate cancer  His father had colon cancer.  No GI symptoms; (no bleeding, no constipation, no change in bowels).  Prostatectomy July 2016, went well.  He's never had colon cancer screening that he is aware of    Review of systems: Pertinent positive and negative review of systems were noted in the above HPI section. Complete review of systems was performed and was otherwise normal.   Past Medical History  Diagnosis Date  . TIA (transient ischemic attack)     15 YRS AGO  . Memory loss   . Headache   . Hypertension     PT STOPPED BP MED 1 MONTH AGO ("BP WAS NORMAL")  . Arthritis   . Prostate cancer ([place])   . Diabetes ([place])     NOT TAKEN ANY DIABETIC MED X 1 MONTH  . Difficulty sleeping     Past Surgical History  Procedure Laterality Date  . Hernia repair      X2 ( 1 ING HERNIA / 1 UMBILICAL HERNIA )  . Mass excision      L UPPER ARM  . Surgery scrotal / testicular    . Robot assisted laparoscopic radical prostatectomy N/A 01/04/2015    Procedure: ROBOTIC ASSISTED LAPAROSCOPIC RADICAL PROSTATECTOMY LEVEL 2;  Surgeon: Raynelle Bring, MD;  Location: [facility];  Service: Urology;  Laterality: N/A;  . Lymphadenectomy Bilateral 01/04/2015    Procedure: BILATERAL LYMPHADENECTOMY;  Surgeon: Raynelle Bring, MD;  Location: [facility];  Service: Urology;  Laterality: Bilateral;    Current Outpatient Prescriptions  Medication Sig Dispense Refill  . acetaminophen (TYLENOL) 500 MG tablet Take 1,000 mg by mouth every 6 (six) hours as needed for moderate pain.    Marland Kitchen MAGNESIUM PO Take 1 tablet by mouth daily.     No current facility-administered medications for this visit.    Allergies as of 06/25/2015  . (No Known Allergies)    Family History  Problem Relation Age of Onset  . Anuerysm Mother    . Stroke Mother   . Hypertension Father   . Peripheral vascular disease Father   . Diabetes Sister   . Heart murmur Sister     Social History   Social History  . Marital Status: Married    Spouse Name: N/A  . Number of Children: N/A  . Years of Education: N/A   Occupational History  . Not on file.   Social History Main Topics  . Smoking status: Never Smoker   . Smokeless tobacco: Never Used  . Alcohol Use: 0.0 oz/week    0 Standard drinks or equivalent per week     Comment: OCCASIONAL  . Drug Use: No  . Sexual Activity:    Partners: Female   Other Topics Concern  . Not on file   Social History Narrative     Physical Exam: BP 140/80 mmHg  Pulse 68  Ht 6\' 3"  (1.905 m)  Wt 267 lb (121.11 kg)  BMI 33.37 kg/m2 Constitutional: generally well-appearing Psychiatric: alert and oriented x3 Eyes: extraocular movements intact Mouth: oral pharynx moist, no lesions Neck: supple no lymphadenopathy Cardiovascular: heart regular rate and rhythm Lungs: clear to auscultation bilaterally Abdomen: soft, nontender, nondistended, no obvious ascites, no peritoneal signs, normal bowel sounds Extremities: no lower extremity edema  bilaterally Skin: no lesions on visible extremities   Assessment and plan: 49 y.o. male with  Elevated risk for colon cancer (Father had colon cancer)  Will proceed with colonoscopy for screening at his soonest convenience. He has no GI symptoms but he is pretty sure his father had colon cancer. He is AA and generally colon cancer screening starts at age 53 for AA as well.   Owens Loffler, MD [facility] 06/25/2015, 11:29 AM  Cc: Everardo Beals, NP

## 2015-08-09 ENCOUNTER — Encounter: Payer: Self-pay | Admitting: Gastroenterology

## 2015-08-09 ENCOUNTER — Ambulatory Visit (AMBULATORY_SURGERY_CENTER): Payer: 59 | Admitting: Gastroenterology

## 2015-08-09 VITALS — BP 134/76 | HR 53 | Temp 97.8°F | Resp 18 | Ht 75.0 in | Wt 267.0 lb

## 2015-08-09 DIAGNOSIS — D122 Benign neoplasm of ascending colon: Secondary | ICD-10-CM

## 2015-08-09 DIAGNOSIS — D123 Benign neoplasm of transverse colon: Secondary | ICD-10-CM | POA: Diagnosis not present

## 2015-08-09 DIAGNOSIS — Z800 Family history of malignant neoplasm of digestive organs: Secondary | ICD-10-CM | POA: Diagnosis not present

## 2015-08-09 DIAGNOSIS — Z1211 Encounter for screening for malignant neoplasm of colon: Secondary | ICD-10-CM

## 2015-08-09 MED ORDER — SODIUM CHLORIDE 0.9 % IV SOLN: 500.0000 mL | INTRAVENOUS | Status: DC

## 2015-08-09 NOTE — Progress Notes (Signed)
To Pacu-Pt awake and alert spont resp. Report to RN

## 2015-08-09 NOTE — Progress Notes (Signed)
Both wife and mother-in-law asked many questions about post procedure.  I explained to them that this was a lot different than his prostate surgery, and that the meds would wear off quickly. I explained that he would probably have another bm within days.  The wife stated that he would not have a bm without the help pf miralax. The patient disagreed. He stated that he only had problems after his prostate surgery not now.

## 2015-08-09 NOTE — Patient Instructions (Signed)
YOU HAD AN ENDOSCOPIC PROCEDURE TODAY AT [facility]:   Refer to the procedure report that was given to you for any specific questions about what was found during the examination.  If the procedure report does not answer your questions, please call your gastroenterologist to clarify.  If you requested that your care partner not be given the details of your procedure findings, then the procedure report has been included in a sealed envelope for you to review at your convenience later.  YOU SHOULD EXPECT: Some feelings of bloating in the abdomen. Passage of more gas than usual.  Walking can help get rid of the air that was put into your GI tract during the procedure and reduce the bloating. If you had a lower endoscopy (such as a colonoscopy or flexible sigmoidoscopy) you may notice spotting of blood in your stool or on the toilet paper. If you underwent a bowel prep for your procedure, you may not have a normal bowel movement for a few days.  Please Note:  You might notice some irritation and congestion in your nose or some drainage.  This is from the oxygen used during your procedure.  There is no need for concern and it should clear up in a day or so.  SYMPTOMS TO REPORT IMMEDIATELY:   Following lower endoscopy (colonoscopy or flexible sigmoidoscopy):  Excessive amounts of blood in the stool  Significant tenderness or worsening of abdominal pains  Swelling of the abdomen that is new, acute  Fever of 100F or higher     For urgent or emergent issues, a gastroenterologist can be reached at any hour by calling [phone].   DIET: Your first meal following the procedure should be a small meal and then it is ok to progress to your normal diet. Heavy or fried foods are harder to digest and may make you feel nauseous or bloated.  Likewise, meals heavy in dairy and vegetables can increase bloating.  Drink plenty of fluids but you should avoid alcoholic beverages for 24  hours.  ACTIVITY:  You should plan to take it easy for the rest of today and you should NOT DRIVE or use heavy machinery until tomorrow (because of the sedation medicines used during the test).    FOLLOW UP: Our staff will call the number listed on your records the next business day following your procedure to check on you and address any questions or concerns that you may have regarding the information given to you following your procedure. If we do not reach you, we will leave a message.  However, if you are feeling well and you are not experiencing any problems, there is no need to return our call.  We will assume that you have returned to your regular daily activities without incident.  If any biopsies were taken you will be contacted by phone or by letter within the next 1-3 weeks.  Please call us at [phone] if you have not heard about the biopsies in 3 weeks.    SIGNATURES/CONFIDENTIALITY: You and/or your care partner have signed paperwork which will be entered into your electronic medical record.  These signatures attest to the fact that that the information above on your After Visit Summary has been reviewed and is understood.  Full responsibility of the confidentiality of this discharge information lies with you and/or your care-partner.  Please, read all of the handouts given to you by your recovery room nurse.  Thank-you for choosing [place] for your healthcare  needs today.

## 2015-08-09 NOTE — Progress Notes (Signed)
Called to room to assist during endoscopic procedure.  Patient ID and intended procedure confirmed with present staff. Received instructions for my participation in the procedure from the performing physician.  

## 2015-08-09 NOTE — Op Note (Signed)
[facility]  [address] [place], [id number]   COLONOSCOPY PROCEDURE REPORT  PATIENT: Jose Bishop, Jose Bishop  MR#: [id number] BIRTHDATE: [birth year] , 49  yrs. old GENDER: male ENDOSCOPIST: Milus Banister, MD [place], MD PROCEDURE DATE:  08/09/2015 PROCEDURE:   Colonoscopy, screening, Colonoscopy with snare polypectomy, and Colonoscopy with biopsy First Screening Colonoscopy - Avg.  risk and is 50 yrs.  old or older Yes.  Prior Negative Screening - Now for repeat screening. N/A  History of Adenoma - Now for follow-up colonoscopy & has been > or = to 3 yrs.  N/A  Polyps removed today? Yes ASA CLASS:   Class II INDICATIONS:Screening for colonic neoplasia, FH Colon or Rectal Adenocarcinoma, and father had colon cancer. MEDICATIONS: Monitored anesthesia care and Propofol 250 mg IV  DESCRIPTION OF PROCEDURE:   After the risks benefits and alternatives of the procedure were thoroughly explained, informed consent was obtained.  The digital rectal exam revealed no abnormalities of the rectum.   The LB [id number]  endoscope was introduced through the anus and advanced to the cecum, which was identified by both the appendix and ileocecal valve. No adverse events experienced.   The quality of the prep was excellent.  The instrument was then slowly withdrawn as the colon was fully examined. Estimated blood loss is zero unless otherwise noted in this procedure report.  COLON FINDINGS: A sessile polyp measuring 2 mm in size was found in the ascending colon.  A polypectomy was performed with cold forceps.  The resection was complete, the polyp tissue was completely retrieved and sent to histology.   A sessile polyp ranging between 3-33mm in size was found in the transverse colon.  A polypectomy was performed with a cold snare.  The resection was complete, the polyp tissue was completely retrieved and sent to histology.   The examination was otherwise normal.   Retroflexed views revealed no abnormalities. The time to cecum = 3.7 Withdrawal time = 6.6   The scope was withdrawn and the procedure completed. COMPLICATIONS: There were no immediate complications.  ENDOSCOPIC IMPRESSION: 1.   Sessile polyp was found in the ascending colon; polypectomy was performed with cold forceps 2.   Sessile polyp ranging between 3-59mm in size was found in the transverse colon; polypectomy was performed with a cold snare 3.   The examination was otherwise normal  RECOMMENDATIONS: Given your significant family history of colon cancer (father) , you should have a repeat colonoscopy in 5 years even if the polyps today are NOT precancerous.  You will receive a letter within 1-2 weeks with the results of your biopsy as well as final recommendations.  Please call my office if you have not received a letter after 3 weeks.  eSigned:  Milus Banister, MD 08/09/2015 3:03 PM

## 2015-08-10 ENCOUNTER — Telehealth: Payer: Self-pay

## 2015-08-10 NOTE — Telephone Encounter (Signed)
  Follow up Call-  Call back number 08/09/2015  Post procedure Call Back phone  # [phone]  Permission to leave phone message No     Patient questions:  Do you have a fever, pain , or abdominal swelling? No. Pain Score  0 *  Have you tolerated food without any problems? Yes.    Have you been able to return to your normal activities? Yes.    Do you have any questions about your discharge instructions: Diet   No. Medications  No. Follow up visit  No.  Do you have questions or concerns about your Care? No.  Actions: * If pain score is 4 or above: No action needed, pain <4.

## 2015-08-19 ENCOUNTER — Encounter: Payer: Self-pay | Admitting: Gastroenterology

## 2015-08-25 ENCOUNTER — Telehealth: Payer: Self-pay | Admitting: Gastroenterology

## 2015-08-30 NOTE — Telephone Encounter (Signed)
Letter has been mailed with the information below   Dear Jose Bishop,  Two polyps which I removed during your recent procedure were proven to be completely benign but are considered "pre-cancerous" polyps that MAY have grown into cancer if they had not been removed.  Studies shows that at least 20% of women over age 49 and 30% of men over age 74 have pre-cancerous polyps.  Based on current nationally recognized surveillance guidelines, I recommend that you have a repeat colonoscopy in 5 years.   If you develop any new rectal bleeding, abdominal pain or significant bowel habit changes, please contact me before then.   Left message on machine to call back

## 2015-08-31 NOTE — Telephone Encounter (Signed)
Pt has been notified of the information and all questions answered.

## 2015-12-14 ENCOUNTER — Telehealth: Payer: Self-pay | Admitting: Gastroenterology

## 2015-12-14 NOTE — Telephone Encounter (Signed)
I sent an email to Jose Bishop to check on his codes.

## 2015-12-21 NOTE — Telephone Encounter (Signed)
Procedures Coded Correctly per our Coder Alisia Ferrari. He had a $3000 deductible that he had to meet prior to insurance covering procedure.

## 2016-01-26 ENCOUNTER — Emergency Department (HOSPITAL_COMMUNITY)
Admission: EM | Admit: 2016-01-26 | Discharge: 2016-01-26 | Disposition: A | Discharge status: Home / Self Care | Payer: Managed Care, Other (non HMO) | Attending: Emergency Medicine | Admitting: Emergency Medicine

## 2016-01-26 ENCOUNTER — Other Ambulatory Visit (HOSPITAL_COMMUNITY): Payer: 59

## 2016-01-26 ENCOUNTER — Encounter (HOSPITAL_COMMUNITY): Payer: Self-pay | Admitting: Neurology

## 2016-01-26 ENCOUNTER — Emergency Department (HOSPITAL_COMMUNITY): Payer: Managed Care, Other (non HMO)

## 2016-01-26 DIAGNOSIS — R1110 Vomiting, unspecified: Secondary | ICD-10-CM | POA: Insufficient documentation

## 2016-01-26 DIAGNOSIS — Z8673 Personal history of transient ischemic attack (TIA), and cerebral infarction without residual deficits: Secondary | ICD-10-CM | POA: Insufficient documentation

## 2016-01-26 DIAGNOSIS — Z8546 Personal history of malignant neoplasm of prostate: Secondary | ICD-10-CM | POA: Diagnosis not present

## 2016-01-26 DIAGNOSIS — E119 Type 2 diabetes mellitus without complications: Secondary | ICD-10-CM | POA: Diagnosis not present

## 2016-01-26 DIAGNOSIS — R197 Diarrhea, unspecified: Secondary | ICD-10-CM | POA: Diagnosis not present

## 2016-01-26 DIAGNOSIS — R1084 Generalized abdominal pain: Secondary | ICD-10-CM | POA: Diagnosis not present

## 2016-01-26 DIAGNOSIS — I10 Essential (primary) hypertension: Secondary | ICD-10-CM | POA: Diagnosis not present

## 2016-01-26 DIAGNOSIS — R0789 Other chest pain: Secondary | ICD-10-CM | POA: Insufficient documentation

## 2016-01-26 DIAGNOSIS — R079 Chest pain, unspecified: Secondary | ICD-10-CM | POA: Diagnosis present

## 2016-01-26 LAB — I-STAT TROPONIN, ED
TROPONIN I, POC: 0 ng/mL (ref 0.00–0.08)
Troponin i, poc: 0 ng/mL (ref 0.00–0.08)

## 2016-01-26 LAB — BASIC METABOLIC PANEL
Anion gap: 9 (ref 5–15)
BUN: 12 mg/dL (ref 6–20)
CALCIUM: 9.3 mg/dL (ref 8.9–10.3)
CHLORIDE: 105 mmol/L (ref 101–111)
CO2: 21 mmol/L — AB (ref 22–32)
CREATININE: 1.09 mg/dL (ref 0.61–1.24)
GFR calc Af Amer: >60 mL/min (ref >60)
GFR calc non Af Amer: >60 mL/min (ref >60)
Glucose, Bld: 146 mg/dL — ABNORMAL HIGH (ref 65–99)
Potassium: 4.1 mmol/L (ref 3.5–5.1)
SODIUM: 135 mmol/L (ref 135–145)

## 2016-01-26 LAB — CBC
HCT: 46.4 % (ref 39.0–52.0)
Hemoglobin: 15.2 g/dL (ref 13.0–17.0)
MCH: 30.5 pg (ref 26.0–34.0)
MCHC: 32.8 g/dL (ref 30.0–36.0)
MCV: 93 fL (ref 78.0–100.0)
PLATELETS: 260 10*3/uL (ref 150–400)
RBC: 4.99 MIL/uL (ref 4.22–5.81)
RDW: 13.8 % (ref 11.5–15.5)
WBC: 5.9 10*3/uL (ref 4.0–10.5)

## 2016-01-26 MED ORDER — IOPAMIDOL (ISOVUE-370) INJECTION 76%
INTRAVENOUS | Status: AC
  Administered 2016-01-26: 100 mL
  Filled 2016-01-26: qty 100

## 2016-01-26 MED ORDER — ONDANSETRON HCL 4 MG PO TABS
4.0000 mg | ORAL_TABLET | Freq: Three times a day (TID) | ORAL | 0 refills | Status: DC | PRN
Start: 2016-01-26 — End: 2017-01-24

## 2016-01-26 MED ORDER — KETOROLAC TROMETHAMINE 30 MG/ML IJ SOLN
30.0000 mg | Freq: Once | INTRAMUSCULAR | Status: AC
  Administered 2016-01-26: 30 mg via INTRAVENOUS
  Filled 2016-01-26: qty 1

## 2016-01-26 NOTE — ED Triage Notes (Signed)
Pt here from [facility], c/o CP that's been going on for awhile with radiation into both sides of neck. Today he is feeling generalized weakness, worsening CP and more frequent, with nausea, diaphoresis this morning. Pt is alert but is drowsy.

## 2016-01-26 NOTE — ED Notes (Signed)
PA at bedside.

## 2016-01-26 NOTE — ED Provider Notes (Signed)
[facility] DEPT Provider Note   CSN: [id number] Arrival date & time: 01/26/16  [id number]  First Provider Contact:  None       History   Chief Complaint Chief Complaint  Patient presents with  . Chest Pain    HPI Jose Bishop is a 50 y.o. male.  HPI   Patient presents with multiple complaints.  Pt with hx DM, HTN has had central and right sided chest pain x 1 month that is constant, worse with palpation and deep inspiration.  The pain now radiates into the bilateral neck.  Also has right hand tingling and sensation of right arm swelling.  The pain was initially relieved with cayenne pepper and water but not recently.  Has noted swelling in his bilateral legs. Denies fevers, chest trauma, heavy lifting.  Today he developed diffuse abdominal pain with 3 episodes of loose stool and one episode of vomiting.      Past Medical History:  Diagnosis Date  . Arthritis   . Diabetes ([place])    NOT TAKEN ANY DIABETIC MED X 1 MONTH  . Difficulty sleeping   . Headache   . Hypertension    PT STOPPED BP MED 1 MONTH AGO ("BP WAS NORMAL")  . Memory loss   . Prostate cancer ([place])   . TIA (transient ischemic attack)    15 YRS AGO    Patient Active Problem List   Diagnosis Date Noted  . Prostate cancer ([place]) 01/04/2015  . Stabbing headache 10/02/2014  . History of recurrent TIAs 10/02/2014  . Family history of cerebral aneurysm 10/02/2014    Past Surgical History:  Procedure Laterality Date  . HERNIA REPAIR     X2 ( 1 ING HERNIA / 1 UMBILICAL HERNIA )  . LYMPHADENECTOMY Bilateral 01/04/2015   Procedure: BILATERAL LYMPHADENECTOMY;  Surgeon: Raynelle Bring, MD;  Location: [facility];  Service: Urology;  Laterality: Bilateral;  . MASS EXCISION     L UPPER ARM  . ROBOT ASSISTED LAPAROSCOPIC RADICAL PROSTATECTOMY N/A 01/04/2015   Procedure: ROBOTIC ASSISTED LAPAROSCOPIC RADICAL PROSTATECTOMY LEVEL 2;  Surgeon: Raynelle Bring, MD;  Location: [facility];  Service: Urology;  Laterality: N/A;  .  SURGERY SCROTAL / TESTICULAR         Home Medications    Prior to Admission medications   Medication Sig Start Date End Date Taking? Authorizing Provider  acetaminophen (TYLENOL) 500 MG tablet Take 1,000 mg by mouth every 6 (six) hours as needed for moderate pain.   Yes Historical Provider, MD  MAGNESIUM PO Take 1 tablet by mouth daily.   Yes Historical Provider, MD    Family History Family History  Problem Relation Age of Onset  . Anuerysm Mother   . Stroke Mother   . Hypertension Father   . Peripheral vascular disease Father   . Diabetes Sister   . Heart murmur Sister     Social History Social History  Substance Use Topics  . Smoking status: Never Smoker  . Smokeless tobacco: Never Used  . Alcohol use 0.0 oz/week     Comment: OCCASIONAL     Allergies   Review of patient's allergies indicates no known allergies.   Review of Systems Review of Systems  All other systems reviewed and are negative.    Physical Exam Updated Vital Signs BP 141/94 (BP Location: Right Arm)   Pulse (!) 55   Temp 97.8 F (36.6 C) (Oral)   Resp 14   SpO2 99%   Physical Exam  Constitutional: He  appears well-developed and well-nourished. No distress.  HENT:  Head: Normocephalic and atraumatic.  Neck: Neck supple.  Cardiovascular: Normal rate and regular rhythm.   Pulmonary/Chest: Effort normal and breath sounds normal. No respiratory distress. He has no wheezes. He has no rales. He exhibits tenderness (right chest wall tender to palpation, reproduces pain ).  Abdominal: Soft. He exhibits no distension and no mass. There is no tenderness. There is no rebound and no guarding.  Musculoskeletal: Normal range of motion. He exhibits no edema or tenderness.  Neurological: He is alert. He exhibits normal muscle tone.  Skin: He is not diaphoretic.  Nursing note and vitals reviewed.    ED Treatments / Results  Labs (all labs ordered are listed, but only abnormal results are  displayed) Labs Reviewed  BASIC METABOLIC PANEL - Abnormal; Notable for the following:       Result Value   CO2 21 (*)    Glucose, Bld 146 (*)    All other components within normal limits  CBC  I-STAT TROPOININ, ED  I-STAT TROPOININ, ED    EKG  EKG Interpretation  Date/Time:  Wednesday January 26 2016 09:40:29 EDT Ventricular Rate:  55 PR Interval:  154 QRS Duration: 82 QT Interval:  452 QTC Calculation: 432 R Axis:   68 Text Interpretation:  Sinus bradycardia Otherwise normal ECG no significant change since 2016 Confirmed by GOLDSTON MD, SCOTT [phone]) on 01/26/2016 11:15:12 AM       Radiology Dg Chest 2 View  Result Date: 01/26/2016 CLINICAL DATA:  Chest pain on the right. Radiating to right shoulder. Shortness of breath. Symptoms for 1 month. EXAM: CHEST  2 VIEW COMPARISON:  11/16/2014 FINDINGS: Heart and mediastinal contours are within normal limits. No focal opacities or effusions. No acute bony abnormality. IMPRESSION: No active cardiopulmonary disease. Electronically Signed   By: Rolm Baptise M.D.   On: 01/26/2016 10:20    Procedures Procedures (including critical care time)  Medications Ordered in ED Medications - No data to display   Initial Impression / Assessment and Plan / ED Course  I have reviewed the triage vital signs and the nursing notes.  Pertinent labs & imaging results that were available during my care of the patient were reviewed by me and considered in my medical decision making (see chart for details).  Clinical Course    Afebrile, nontoxic patient with constant right sided chest pain for the past month, it is reproducible with palpation.  He also noted right arm and bilateral leg swelling, which was not apparent on exam.  Abdominal pain with vomiting and diarrhea today may be viral - abdominal exam is benign.  Workup reassuring.  Pt does have risk factors for cardiac disease but the pain is very atypical, doubt ACS.  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  Doubt PE.   D/C home with close PCP follow up.  Discussed result, findings, treatment, and follow up  with patient.  Pt given return precautions.  Pt verbalizes understanding and agrees with plan.       Final Clinical Impressions(s) / ED Diagnoses   Final diagnoses:  Atypical chest pain  Generalized abdominal pain  Vomiting and diarrhea    New Prescriptions Discharge Medication List as of 01/26/2016  4:29 PM    START taking these medications   Details  ondansetron (ZOFRAN) 4 MG tablet Take 1 tablet (4 mg total) by mouth every 8 (eight) hours as needed for nausea  or vomiting., Starting Wed 01/26/2016, Print         [place], [place] 01/26/16 [place], MD 01/27/16 [phone]

## 2016-01-26 NOTE — ED Notes (Signed)
Ginger ale given to pt.  

## 2016-01-26 NOTE — Discharge Instructions (Signed)
Read the information below.  Use the prescribed medication as directed.  Please discuss all new medications with your pharmacist.  You may return to the Emergency Department at any time for worsening condition or any new symptoms that concern you.      If you develop worsening chest pain, shortness of breath, fever, you pass out, or become weak or dizzy,  high fevers, worsening abdominal pain, uncontrolled vomiting, or are unable to tolerate fluids by mouth, return to the ER for a recheck.

## 2016-01-26 NOTE — ED Notes (Signed)
Jose Bishop

## 2016-02-14 ENCOUNTER — Other Ambulatory Visit: Payer: Self-pay | Admitting: Family Medicine

## 2016-02-14 DIAGNOSIS — R42 Dizziness and giddiness: Secondary | ICD-10-CM

## 2016-02-14 DIAGNOSIS — Z823 Family history of stroke: Secondary | ICD-10-CM

## 2016-02-18 ENCOUNTER — Ambulatory Visit
Admission: RE | Admit: 2016-02-18 | Discharge: 2016-02-18 | Disposition: A | Discharge status: Home / Self Care | Payer: Managed Care, Other (non HMO) | Source: Ambulatory Visit | Attending: Family Medicine | Admitting: Family Medicine

## 2016-02-18 DIAGNOSIS — Z823 Family history of stroke: Secondary | ICD-10-CM

## 2016-02-18 DIAGNOSIS — R42 Dizziness and giddiness: Secondary | ICD-10-CM

## 2016-03-20 ENCOUNTER — Ambulatory Visit (HOSPITAL_COMMUNITY)
Admission: EM | Admit: 2016-03-20 | Discharge: 2016-03-20 | Disposition: A | Discharge status: Home / Self Care | Payer: Managed Care, Other (non HMO) | Attending: Family Medicine | Admitting: Family Medicine

## 2016-03-20 ENCOUNTER — Encounter (HOSPITAL_COMMUNITY): Payer: Self-pay | Admitting: Emergency Medicine

## 2016-03-20 DIAGNOSIS — T63441A Toxic effect of venom of bees, accidental (unintentional), initial encounter: Secondary | ICD-10-CM | POA: Diagnosis not present

## 2016-03-20 MED ORDER — METHYLPREDNISOLONE ACETATE 80 MG/ML IJ SUSP
INTRAMUSCULAR | Status: AC
  Filled 2016-03-20: qty 1

## 2016-03-20 MED ORDER — METHYLPREDNISOLONE ACETATE 80 MG/ML IJ SUSP
80.0000 mg | Freq: Once | INTRAMUSCULAR | Status: AC
  Administered 2016-03-20: 80 mg via INTRAMUSCULAR

## 2016-03-20 NOTE — Discharge Instructions (Signed)
I recommend you pick up some Benadryl at the pharmacy tonight and take one before bedtime. This will lessen the swelling.

## 2016-03-20 NOTE — ED Triage Notes (Signed)
Patient reports stung by a yellow jacket on right hand .  Patient was wearing a glove when he was stung.  Right hand is swollen.  Hand is painful.  No sob, no mouth swelling or itching.

## 2016-03-20 NOTE — ED Provider Notes (Signed)
[facility]    CSN: [id number] Arrival date & time: 03/20/16  1941  First Provider Contact:  First MD Initiated Contact with Patient 03/20/16 2048        History   Chief Complaint Chief Complaint  Patient presents with  . Insect Bite    HPI Jose Bishop is a 50 y.o. male.   This a 50 year old gentleman who was stung by a yellow jacket today with subsequent right hand swelling. He has a history of anaphylaxis to bee stings in the past, but he's had none of those symptoms with this particular staining. The site of the sting was third or middle finger on the right at the PIP joint. He said some localized swelling and itching since is staying at 7 PM this evening. It's now a 8:55 PM.  Patient works as a Administrator. He was working at home on the sting occurred.      Past Medical History:  Diagnosis Date  . Arthritis   . Diabetes ([place])    NOT TAKEN ANY DIABETIC MED X 1 MONTH  . Difficulty sleeping   . Headache   . Hypertension    PT STOPPED BP MED 1 MONTH AGO ("BP WAS NORMAL")  . Memory loss   . Prostate cancer ([place])   . TIA (transient ischemic attack)    15 YRS AGO    Patient Active Problem List   Diagnosis Date Noted  . Prostate cancer ([place]) 01/04/2015  . Stabbing headache 10/02/2014  . History of recurrent TIAs 10/02/2014  . Family history of cerebral aneurysm 10/02/2014    Past Surgical History:  Procedure Laterality Date  . HERNIA REPAIR     X2 ( 1 ING HERNIA / 1 UMBILICAL HERNIA )  . LYMPHADENECTOMY Bilateral 01/04/2015   Procedure: BILATERAL LYMPHADENECTOMY;  Surgeon: Raynelle Bring, MD;  Location: [facility];  Service: Urology;  Laterality: Bilateral;  . MASS EXCISION     L UPPER ARM  . ROBOT ASSISTED LAPAROSCOPIC RADICAL PROSTATECTOMY N/A 01/04/2015   Procedure: ROBOTIC ASSISTED LAPAROSCOPIC RADICAL PROSTATECTOMY LEVEL 2;  Surgeon: Raynelle Bring, MD;  Location: [facility];  Service: Urology;  Laterality: N/A;  . SURGERY SCROTAL / TESTICULAR          Home Medications    Prior to Admission medications   Medication Sig Start Date End Date Taking? Authorizing Provider  acetaminophen (TYLENOL) 500 MG tablet Take 1,000 mg by mouth every 6 (six) hours as needed for moderate pain.    Historical Provider, MD  MAGNESIUM PO Take 1 tablet by mouth daily.    Historical Provider, MD  ondansetron (ZOFRAN) 4 MG tablet Take 1 tablet (4 mg total) by mouth every 8 (eight) hours as needed for nausea or vomiting. 01/26/16   Clayton Bibles, PA-C    Family History Family History  Problem Relation Age of Onset  . Anuerysm Mother   . Stroke Mother   . Hypertension Father   . Peripheral vascular disease Father   . Diabetes Sister   . Heart murmur Sister     Social History Social History  Substance Use Topics  . Smoking status: Never Smoker  . Smokeless tobacco: Never Used  . Alcohol use 0.0 oz/week     Comment: OCCASIONAL     Allergies   Review of patient's allergies indicates no known allergies.   Review of Systems Review of Systems  Constitutional: Negative.   HENT: Negative.   Eyes: Negative.   Respiratory: Negative.   Cardiovascular: Negative.  Gastrointestinal: Negative.   Musculoskeletal: Negative for neck pain and neck stiffness.     Physical Exam Triage Vital Signs ED Triage Vitals  Enc Vitals Group     BP 03/20/16 2045 157/92     Pulse Rate 03/20/16 2045 60     Resp 03/20/16 2045 12     Temp 03/20/16 2045 97.9 F (36.6 C)     Temp Source 03/20/16 2045 Oral     SpO2 03/20/16 2045 98 %     Weight --      Height --      Head Circumference --      Peak Flow --      Pain Score 03/20/16 2044 4     Pain Loc --      Pain Edu? --      Excl. in Graf? --    No data found.   Updated Vital Signs BP 157/92 (BP Location: Left Arm)   Pulse 60   Temp 97.9 F (36.6 C) (Oral)   Resp 12   SpO2 98%       Physical Exam  Constitutional: He is oriented to person, place, and time. He appears well-developed and  well-nourished.  HENT:  Head: Normocephalic and atraumatic.  Right Ear: External ear normal.  Left Ear: External ear normal.  Nose: Nose normal.  Mouth/Throat: Oropharynx is clear and moist.  Eyes: Conjunctivae and EOM are normal. Pupils are equal, round, and reactive to light.  Neck: Normal range of motion. Neck supple.  Cardiovascular: Normal rate, regular rhythm and normal heart sounds.   Pulmonary/Chest: Effort normal and breath sounds normal.  Musculoskeletal:  Moderate swelling of the right middle finger, proximal phalanx, and MCP joint. He is unable to completely flex his middle finger because of swelling.  Neurological: He is alert and oriented to person, place, and time.  Nursing note and vitals reviewed.    UC Treatments / Results  Labs (all labs ordered are listed, but only abnormal results are displayed) Labs Reviewed - No data to display  EKG  EKG Interpretation None       Radiology No results found.  Procedures Procedures (including critical care time)  Medications Ordered in UC Medications  methylPREDNISolone acetate (DEPO-MEDROL) injection 80 mg (not administered)     Initial Impression / Assessment and Plan / UC Course  I have reviewed the triage vital signs and the nursing notes.  Pertinent labs & imaging results that were available during my care of the patient were reviewed by me and considered in my medical decision making (see chart for details).  Clinical Course      Final Clinical Impressions(s) / UC Diagnoses   Final diagnoses:  Bee sting, accidental or unintentional, initial encounter    New Prescriptions New Prescriptions   No medications on file  By mouth Benadryl tonight.   Robyn Haber, MD 03/20/16 2056

## 2017-01-24 ENCOUNTER — Encounter: Payer: Self-pay | Admitting: Cardiovascular Disease

## 2017-01-24 ENCOUNTER — Ambulatory Visit (INDEPENDENT_AMBULATORY_CARE_PROVIDER_SITE_OTHER): Payer: Managed Care, Other (non HMO) | Admitting: Cardiovascular Disease

## 2017-01-24 ENCOUNTER — Encounter (INDEPENDENT_AMBULATORY_CARE_PROVIDER_SITE_OTHER): Payer: Self-pay

## 2017-01-24 VITALS — BP 148/90 | HR 50 | Ht 75.0 in | Wt 265.0 lb

## 2017-01-24 DIAGNOSIS — Z13220 Encounter for screening for lipoid disorders: Secondary | ICD-10-CM

## 2017-01-24 DIAGNOSIS — R079 Chest pain, unspecified: Secondary | ICD-10-CM | POA: Diagnosis not present

## 2017-01-24 DIAGNOSIS — R0789 Other chest pain: Secondary | ICD-10-CM | POA: Diagnosis not present

## 2017-01-24 NOTE — Progress Notes (Signed)
     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 He self-referred for evaluation of new onset chest pain which began 2 weeks ago. He has had 3 episodes that lasted days a time with radiation to his neck and left upper extremity. He really has no risk factors other than recently diagnosed type 2 diabetes.   Current Meds  Medication Sig  . metFORMIN (GLUCOPHAGE) 500 MG tablet Take 500 mg by mouth 2 (two) times daily with a meal.     Allergies  Allergen Reactions  . Dapagliflozin-Metformin Hcl Er     Social History   Social History  . Marital status: Married    Spouse name: N/A  . Number of children: N/A  . Years of education: N/A   Occupational History  . Not on file.   Social History Main Topics  . Smoking status: Never Smoker  . Smokeless tobacco: Never Used  . Alcohol use 0.0 oz/week     Comment: OCCASIONAL  . Drug use: No  . Sexual activity: Yes    Partners: Female   Other Topics Concern  . Not on file   Social History Narrative  . No narrative on file     Review of Systems: General: negative for chills, fever, night sweats or weight changes.  Cardiovascular: negative for chest pain, dyspnea on exertion, edema, orthopnea, palpitations, paroxysmal nocturnal dyspnea or shortness of breath Dermatological: negative for rash Respiratory: negative for cough or wheezing Urologic: negative for hematuria Abdominal: negative for nausea, vomiting, diarrhea, bright red blood per rectum, melena, or hematemesis Neurologic: negative for visual changes, syncope, or dizziness All other systems reviewed and are otherwise negative except as noted above.    Blood pressure  (!) 148/90, pulse (!) 50, height 6\' 3"  (1.905 m), weight 265 lb (120.2 kg).  General appearance: alert and no distress Neck: no adenopathy, no carotid bruit, no JVD, supple, symmetrical, trachea midline and thyroid not enlarged, symmetric, no tenderness/mass/nodules Lungs: clear to auscultation bilaterally Heart: regular rate and rhythm, S1, S2 normal, no murmur, click, rub or gallop Extremities: extremities normal, atraumatic, no cyanosis or edema  EKG sinus bradycardia 50 without ST or T-wave changes. There were septal Q waves. I personally reviewed this EKG  ASSESSMENT AND PLAN:   Atypical chest pain [name] is self-referred for recent onset of atypical chest pain. He basically has no risk factors other than type 2 diabetes recently diagnosed. He's had 3 episodes of chest pain or substernal burning to his neck shoulder and left upper extremity. Episodes have lasted days at a time. His EKG shows no acute changes. I am going to get an exercise Myoview stress test to rule out an ischemic etiology.      Lorretta Harp MD FACP,FACC,FAHA, [facility] 01/24/2017 12:22 PM

## 2017-01-24 NOTE — Patient Instructions (Signed)
Medication Instructions: Your physician recommends that you continue on your current medications as directed. Please refer to the Current Medication list given to you today.  Labwork: Your physician recommends that you return for a FASTING lipid profile and hepatic function panel at your earliest convenience.   Testing/Procedures: Your physician has requested that you have an exercise stress myoview. For further information please visit [email or web address]. Please follow instruction sheet, as given.  Follow-Up: Your physician recommends that you schedule a follow-up appointment as needed with Dr. Gwenlyn Found.

## 2017-01-24 NOTE — Assessment & Plan Note (Signed)
Mr. Sandoz is self-referred for recent onset of atypical chest pain. He basically has no risk factors other than type 2 diabetes recently diagnosed. He's had 3 episodes of chest pain or substernal burning to his neck shoulder and left upper extremity. Episodes have lasted days at a time. His EKG shows no acute changes. I am going to get an exercise Myoview stress test to rule out an ischemic etiology.

## 2017-01-25 LAB — LIPID PANEL
CHOL/HDL RATIO: 3.9 ratio (ref 0.0–5.0)
CHOLESTEROL TOTAL: 232 mg/dL — AB (ref 100–199)
HDL: 59 mg/dL (ref >39)
LDL CALC: 149 mg/dL — AB (ref 0–99)
TRIGLYCERIDES: 118 mg/dL (ref 0–149)
VLDL CHOLESTEROL CAL: 24 mg/dL (ref 5–40)

## 2017-01-25 LAB — HEPATIC FUNCTION PANEL
ALT: 16 IU/L (ref 0–44)
AST: 17 IU/L (ref 0–40)
Albumin: 4.5 g/dL (ref 3.5–5.5)
Alkaline Phosphatase: 43 IU/L (ref 39–117)
BILIRUBIN TOTAL: 0.8 mg/dL (ref 0.0–1.2)
Bilirubin, Direct: 0.18 mg/dL (ref 0.00–0.40)
Total Protein: 7.2 g/dL (ref 6.0–8.5)

## 2017-01-30 ENCOUNTER — Other Ambulatory Visit: Payer: Self-pay

## 2017-01-30 DIAGNOSIS — Z13220 Encounter for screening for lipoid disorders: Secondary | ICD-10-CM

## 2017-01-30 DIAGNOSIS — R079 Chest pain, unspecified: Secondary | ICD-10-CM

## 2017-01-31 ENCOUNTER — Telehealth (HOSPITAL_COMMUNITY): Payer: Self-pay

## 2017-01-31 NOTE — Telephone Encounter (Signed)
Encounter complete. 

## 2017-02-01 ENCOUNTER — Telehealth (HOSPITAL_COMMUNITY): Payer: Self-pay

## 2017-02-01 NOTE — Patient Instructions (Signed)

## 2017-02-01 NOTE — Telephone Encounter (Signed)
Encounter complete. 

## 2017-02-01 NOTE — Telephone Encounter (Signed)
UTR x1 Left message yesterday. Awaiting return call at this time. Encounter complete.

## 2017-02-02 ENCOUNTER — Ambulatory Visit (HOSPITAL_COMMUNITY)
Admission: RE | Admit: 2017-02-02 | Discharge: 2017-02-02 | Disposition: A | Discharge status: Home / Self Care | Payer: Managed Care, Other (non HMO) | Source: Ambulatory Visit | Attending: Cardiovascular Disease | Admitting: Cardiovascular Disease

## 2017-02-02 DIAGNOSIS — R079 Chest pain, unspecified: Secondary | ICD-10-CM | POA: Insufficient documentation

## 2017-02-02 DIAGNOSIS — R9439 Abnormal result of other cardiovascular function study: Secondary | ICD-10-CM | POA: Insufficient documentation

## 2017-02-02 LAB — MYOCARDIAL PERFUSION IMAGING
CHL RATE OF PERCEIVED EXERTION: 18
CSEPEDS: 40 s
Estimated workload: 12.9 METS
Exercise duration (min): 10 min
LVDIAVOL: 125 mL (ref 62–150)
LVSYSVOL: 60 mL
MPHR: 170 {beats}/min
NUC STRESS TID: 0.93
Peak HR: 162 {beats}/min
Percent HR: 95 %
Rest HR: 45 {beats}/min
SDS: 1
SRS: 1
SSS: 2

## 2017-02-02 MED ORDER — TECHNETIUM TC 99M TETROFOSMIN IV KIT
10.3000 | PACK | Freq: Once | INTRAVENOUS | Status: AC | PRN
  Administered 2017-02-02: 10.3 via INTRAVENOUS
  Filled 2017-02-02: qty 11

## 2017-02-02 MED ORDER — TECHNETIUM TC 99M TETROFOSMIN IV KIT
30.8000 | PACK | Freq: Once | INTRAVENOUS | Status: AC | PRN
  Administered 2017-02-02: 30.8 via INTRAVENOUS
  Filled 2017-02-02: qty 31

## 2017-02-05 ENCOUNTER — Telehealth: Payer: Self-pay | Admitting: Cardiovascular Disease

## 2017-02-05 NOTE — Telephone Encounter (Addendum)
Left message on home voicemaill to call back.  Patient needs appt with Dr. Gwenlyn Found to follow up  Stress test.

## 2017-02-07 ENCOUNTER — Telehealth: Payer: Self-pay | Admitting: Cardiovascular Disease

## 2017-02-07 NOTE — Telephone Encounter (Signed)
Called the patient and LVM to call and schedule his stress test followup with Dr. Gwenlyn Found.

## 2017-02-08 ENCOUNTER — Telehealth: Payer: Self-pay | Admitting: Cardiovascular Disease

## 2017-02-08 NOTE — Telephone Encounter (Signed)
Patient calling, states that he would like to know results from stress test and blood work.

## 2017-02-08 NOTE — Telephone Encounter (Signed)
lmtcb for results. 

## 2017-02-08 NOTE — Telephone Encounter (Signed)
Message forwarded to [facility], [place]

## 2017-03-28 ENCOUNTER — Ambulatory Visit: Payer: Managed Care, Other (non HMO) | Admitting: Cardiovascular Disease

## 2017-04-16 ENCOUNTER — Telehealth: Payer: Self-pay | Admitting: Neurology

## 2017-04-16 NOTE — Telephone Encounter (Signed)
Called and spoke with Pt, advsd him we haven't seen him but 1 time and that was over 2 yrs ago, advsd him he needs to see the Dr who has been treating his LBP and sciatica, Pt states it was an urgent care. I offered to [facility] him an appt and place him on our cx list and we will contact him if anything opens up

## 2017-04-16 NOTE — Telephone Encounter (Signed)
New Message  Pt walked in stating he is needing an appt due to having pain in right leg, pt thought it was muscle spasm. Soonest is 11.30.18 and pt did not want to wait that long. Pt doesn't know if it is neuro or not.  Pt verbalized it is a hot or burning sensations that moves up his right leg above knee. In order for it to stop he has to beat/hit his leg.  Pt verbalized he could be sitting down or standing. Pt also verbalized muscle doesn't move.  Please f/u with pt

## 2017-04-18 ENCOUNTER — Encounter: Payer: Self-pay | Admitting: Cardiovascular Disease

## 2017-04-18 ENCOUNTER — Ambulatory Visit (INDEPENDENT_AMBULATORY_CARE_PROVIDER_SITE_OTHER): Payer: Managed Care, Other (non HMO) | Admitting: Cardiovascular Disease

## 2017-04-18 DIAGNOSIS — R0789 Other chest pain: Secondary | ICD-10-CM

## 2017-04-18 DIAGNOSIS — E7800 Pure hypercholesterolemia, unspecified: Secondary | ICD-10-CM

## 2017-04-18 DIAGNOSIS — E785 Hyperlipidemia, unspecified: Secondary | ICD-10-CM | POA: Insufficient documentation

## 2017-04-18 MED ORDER — ATORVASTATIN CALCIUM 40 MG PO TABS: 40.0000 mg | ORAL_TABLET | Freq: Every day | ORAL | 3 refills | Status: DC

## 2017-04-18 NOTE — Progress Notes (Signed)
Mr. Whitenight returns today for follow-up of his Myoview stress test performed 02/02/17 in the evaluation of atypical chest pain. It did show mild antero-septal/apical ischemia as well as inferior ischemia. He's had 3-4 episodes of chest pain since I last saw him. Based on this, I am going to obtain a coronary CTA to further evaluate. In addition, his LDLs in the 150 range. I'm going to begin him on atorvastatin 40 mg a day.  Lorretta Harp, M.D., [place], [facility], [place] [address] [place],   [address]  [phone] 04/18/2017 11:19 AM

## 2017-04-18 NOTE — Patient Instructions (Signed)
Medication Instructions: START Atorvastatin 40 mg daily.   Labwork: Your physician recommends that you return for a FASTING lipid profile and hepatic function panel in 3 months.  Your physician recommends that you return for lab work--BMET (prior to CTA).   Testing/Procedures: Cardiac CT Angiography (CTA), is a special type of CT scan that uses a computer to produce multi-dimensional views of major blood vessels throughout the body. In CT angiography, a contrast material is injected through an IV to help visualize the blood vessels   Follow-Up: Your physician wants you to follow-up in: 6 months with Dr. Gwenlyn Found. You will receive a reminder letter in the mail two months in advance. If you don't receive a letter, please call our office to schedule the follow-up appointment.  If you need a refill on your cardiac medications before your next appointment, please call your pharmacy.

## 2017-04-18 NOTE — Assessment & Plan Note (Signed)
History of hyperlipidemia recently for profile performed 01/24/17 revealing an LDL of 149. I am going to begin him on atorvastatin 40 mg a day and will recheck a lipid liver profile in 3 months.

## 2017-04-18 NOTE — Assessment & Plan Note (Signed)
History of atypical chest pain with recent Myoview performed 02/02/17 that showed mild apical septal ischemia and inferior ischemia. Based on this, I'm going to order a "coronary CTA to further evaluate.

## 2017-04-25 ENCOUNTER — Encounter: Payer: Self-pay | Admitting: Cardiovascular Disease

## 2017-04-25 NOTE — Telephone Encounter (Signed)
F/u message Pt returning Rn call .please call back

## 2017-05-01 NOTE — Telephone Encounter (Signed)
This encounter was created in error - please disregard.

## 2017-05-03 LAB — BASIC METABOLIC PANEL
BUN/Creatinine Ratio: 12 (ref 9–20)
BUN: 13 mg/dL (ref 6–24)
CALCIUM: 10 mg/dL (ref 8.7–10.2)
CHLORIDE: 97 mmol/L (ref 96–106)
CO2: 22 mmol/L (ref 20–29)
Creatinine, Ser: 1.08 mg/dL (ref 0.76–1.27)
GFR calc Af Amer: 92 mL/min/{1.73_m2} (ref >59)
GFR calc non Af Amer: 80 mL/min/{1.73_m2} (ref >59)
GLUCOSE: 163 mg/dL — AB (ref 65–99)
POTASSIUM: 4.8 mmol/L (ref 3.5–5.2)
Sodium: 136 mmol/L (ref 134–144)

## 2017-05-03 LAB — HEPATIC FUNCTION PANEL
ALT: 31 IU/L (ref 0–44)
AST: 23 IU/L (ref 0–40)
Albumin: 4.8 g/dL (ref 3.5–5.5)
Alkaline Phosphatase: 50 IU/L (ref 39–117)
Bilirubin Total: 0.6 mg/dL (ref 0.0–1.2)
Bilirubin, Direct: 0.16 mg/dL (ref 0.00–0.40)
Total Protein: 7.8 g/dL (ref 6.0–8.5)

## 2017-05-03 LAB — LIPID PANEL
CHOLESTEROL TOTAL: 221 mg/dL — AB (ref 100–199)
Chol/HDL Ratio: 3.8 ratio (ref 0.0–5.0)
HDL: 58 mg/dL (ref >39)
LDL CALC: 129 mg/dL — AB (ref 0–99)
TRIGLYCERIDES: 169 mg/dL — AB (ref 0–149)
VLDL CHOLESTEROL CAL: 34 mg/dL (ref 5–40)

## 2017-05-07 ENCOUNTER — Encounter: Payer: Self-pay | Admitting: Cardiovascular Disease

## 2017-05-07 ENCOUNTER — Other Ambulatory Visit: Payer: Self-pay | Admitting: Cardiovascular Disease

## 2017-05-07 DIAGNOSIS — E7800 Pure hypercholesterolemia, unspecified: Secondary | ICD-10-CM

## 2017-05-08 ENCOUNTER — Encounter: Payer: Self-pay | Admitting: Cardiovascular Disease

## 2017-05-11 ENCOUNTER — Ambulatory Visit (HOSPITAL_COMMUNITY)
Admission: RE | Admit: 2017-05-11 | Discharge: 2017-05-11 | Disposition: A | Discharge status: Home / Self Care | Payer: Managed Care, Other (non HMO) | Source: Ambulatory Visit | Attending: Cardiovascular Disease | Admitting: Cardiovascular Disease

## 2017-05-11 ENCOUNTER — Ambulatory Visit (HOSPITAL_COMMUNITY): Payer: Managed Care, Other (non HMO)

## 2017-05-11 ENCOUNTER — Telehealth: Payer: Self-pay

## 2017-05-11 DIAGNOSIS — I77810 Thoracic aortic ectasia: Secondary | ICD-10-CM | POA: Insufficient documentation

## 2017-05-11 DIAGNOSIS — K7689 Other specified diseases of liver: Secondary | ICD-10-CM | POA: Diagnosis not present

## 2017-05-11 DIAGNOSIS — R0789 Other chest pain: Secondary | ICD-10-CM | POA: Diagnosis present

## 2017-05-11 DIAGNOSIS — R079 Chest pain, unspecified: Secondary | ICD-10-CM | POA: Diagnosis not present

## 2017-05-11 MED ORDER — METOPROLOL TARTRATE 5 MG/5ML IV SOLN
5.0000 mg | INTRAVENOUS | Status: DC | PRN
  Administered 2017-05-11: 5 mg via INTRAVENOUS
  Filled 2017-05-11: qty 5

## 2017-05-11 MED ORDER — METOPROLOL TARTRATE 5 MG/5ML IV SOLN
INTRAVENOUS | Status: AC
  Administered 2017-05-11: 5 mg via INTRAVENOUS
  Filled 2017-05-11: qty 10

## 2017-05-11 MED ORDER — IOPAMIDOL (ISOVUE-370) INJECTION 76%
INTRAVENOUS | Status: AC
  Administered 2017-05-11: 80 mL
  Filled 2017-05-11: qty 100

## 2017-05-11 MED ORDER — NITROGLYCERIN 0.4 MG SL SUBL
SUBLINGUAL_TABLET | SUBLINGUAL | Status: AC
  Filled 2017-05-11: qty 2

## 2017-05-11 MED ORDER — NITROGLYCERIN 0.4 MG SL SUBL
0.8000 mg | SUBLINGUAL_TABLET | Freq: Once | SUBLINGUAL | Status: DC
  Filled 2017-05-11: qty 25

## 2017-05-11 NOTE — Telephone Encounter (Signed)
      [facility] Pre-operative Risk Assessment    Request for surgical clearance:  1. What type of surgery is being performed?   RIGHT SHOULDER SURGERY SA-SAD-SCOPE W/PARTIAL ACROMIOPLASTY  W/WO CORACOACROMIAL RELEASE SA-DEBRIDEMENT LIMITED, RCR-OPEN ACUTE/LESS THAN 3 MONTHS  2. When is this surgery scheduled? 05-21-2017   3. Are there any medications that need to be held prior to surgery and how long? NONE LISTED   4. Practice name and name of physician performing surgery? [facility]    5. What is your office phone and fax number? [phone]   6. Anesthesia type (None, local, MAC, general) ? NONE LISTED LAST OV DR Gwenlyn Found 04-18-17  Waylan Rocher 05/11/2017, 3:46 PM  _________________________________________________________________   (provider comments below)

## 2017-05-11 NOTE — Telephone Encounter (Signed)
    Chart reviewed as part of pre-operative protocol coverage.  He had a Myoview 02/02/2017 that was intermediate risk.  Jose Bishop was last seen on 04/18/2017 by Dr. Gwenlyn Found.  Because of the intermediate risk Myoview, Jose Bishop was scheduled for cardiac CT, performed 05/11/2017.  The cardiac CT does not show significant CAD, no further workup is needed prior to surgery. Results are pending.  Pre-op covering staff: - Please schedule appointment to discuss test results prior to 11/26 and call patient to inform them. - Please contact requesting surgeon's office via preferred method (i.e, phone, fax) to inform them of need for appointment prior to surgery.  Rosaria Ferries, PA-C 05/11/2017, 4:49 PM

## 2017-05-14 NOTE — Telephone Encounter (Signed)
Appt made with Dr Gwenlyn Found 05/15/2017 @ 9:15 pm

## 2017-05-15 ENCOUNTER — Ambulatory Visit: Payer: Managed Care, Other (non HMO) | Admitting: Cardiovascular Disease

## 2017-05-15 ENCOUNTER — Encounter: Payer: Self-pay | Admitting: Cardiovascular Disease

## 2017-05-15 VITALS — BP 156/96 | HR 59 | Ht 75.0 in | Wt 263.0 lb

## 2017-05-15 DIAGNOSIS — R0789 Other chest pain: Secondary | ICD-10-CM | POA: Diagnosis not present

## 2017-05-15 DIAGNOSIS — Z01810 Encounter for preprocedural cardiovascular examination: Secondary | ICD-10-CM

## 2017-05-15 NOTE — Progress Notes (Signed)
Jose Bishop returns today for follow-up. He was referred to me initially for atypical chest pain. He had a Myoview stress test performed 02/02/17 was reviewed which was read as intermediate risk with ischemia in the anterior wall and apex. Subsequent coronary CTA performed 05/11/17 was essentially normal. Uterus is chest pain anxiety and stress. He is scheduled for surgery which we will clear him for a low risk. I will see him back when necessary.  Lorretta Harp, M.D., [place], [facility], [place] [address] [place], [place]  [address]  [phone] 05/15/2017 9:45 AM

## 2017-05-15 NOTE — Assessment & Plan Note (Signed)
Jose Bishop returns today for follow-up. He was referred to me initially for atypical chest pain. He had a Myoview stress test performed 02/02/17 was reviewed which was read as intermediate risk with ischemia in the anterior wall and apex. Subsequent coronary CTA performed 05/11/17 was essentially normal. Uterus is chest pain anxiety and stress. He is scheduled for surgery which we will clear him for a low risk. I will see him back when necessary.

## 2017-05-15 NOTE — Patient Instructions (Signed)
Medication Instructions: Your physician recommends that you continue on your current medications as directed. Please refer to the Current Medication list given to you today.   Follow-Up: Your physician recommends that you schedule a follow-up appointment as needed with Dr. Gwenlyn Found.  You have been cleared for surgery.

## 2017-05-25 ENCOUNTER — Ambulatory Visit: Payer: Managed Care, Other (non HMO) | Admitting: Neurology

## 2018-04-16 DIAGNOSIS — Z9889 Other specified postprocedural states: Secondary | ICD-10-CM | POA: Insufficient documentation

## 2018-10-25 ENCOUNTER — Telehealth: Payer: Managed Care, Other (non HMO) | Admitting: Family

## 2018-10-25 DIAGNOSIS — R0600 Dyspnea, unspecified: Secondary | ICD-10-CM

## 2018-10-25 NOTE — Progress Notes (Signed)
Based on what you shared with me, I feel your condition warrants further evaluation and I recommend that you be seen for a face to face office visit.     NOTE: If you entered your credit card information for this eVisit, you will not be charged. You may see a "hold" on your card for the $35 but that hold will drop off and you will not have a charge processed.  If you are having a true medical emergency please call 911.  If you need an urgent face to face visit, [facility] has four urgent care centers for your convenience.    PLEASE NOTE: THE [facility] LOCATIONS AND URGENT CARE CLINICS DO NOT HAVE THE TESTING FOR CORONAVIRUS COVID19 AVAILABLE.  IF YOU FEEL YOU NEED THIS TEST YOU MUST GO TO A TRIAGE LOCATION AT ONE OF THE HOSPITAL EMERGENCY DEPARTMENTS   [email or web address] to reserve your spot online an avoid wait times  [facility] [address], [address] [place], [place] [address] Modified hours of operation: Monday-Friday, 12 PM to 6 PM  Saturday & Sunday 10 AM to 4 PM *Across the street from [employer]  [facility] (New Address!) [address], [address] [place], [place] [address] *Just off [address], across the road from [employer]* Modified hours of operation: Monday-Friday, 12 PM to 6 PM  Closed Saturday & Sunday  [facility]'s modified hours of operation will be in effect from May 1 until May 31   The following sites will take your insurance:  . [facility]  [phone] Get Driving Directions Find a Provider at this Location  [address] [place], [place] [address] . 10 am to 8 pm Monday-Friday . 12 pm to 8 pm Saturday-Sunday   . [facility]  [phone] Get Driving Directions Find a Provider at this Location  [address], [address] [place], [place] [address] . 8 am to 8 pm Monday-Friday . 9 am to 6 pm Saturday . 11 am to 6 pm Sunday   . [facility] [place]  [phone] Get Driving Directions  [address] [place], [place] [address] . 8 am to 8 pm Monday-Friday . 8 am to 4 pm Saturday-Sunday   Your e-visit answers were reviewed by a board certified advanced clinical practitioner to complete your personal care plan.  Thank you for using e-Visits. 

## 2019-01-06 DIAGNOSIS — N529 Male erectile dysfunction, unspecified: Secondary | ICD-10-CM | POA: Insufficient documentation

## 2019-03-18 HISTORY — PX: PENILE PROSTHESIS IMPLANT: SHX240

## 2019-04-30 ENCOUNTER — Other Ambulatory Visit: Payer: Self-pay

## 2019-04-30 ENCOUNTER — Emergency Department (HOSPITAL_COMMUNITY)
Admission: EM | Admit: 2019-04-30 | Discharge: 2019-04-30 | Disposition: A | Discharge status: Home / Self Care | Payer: BLUE CROSS/BLUE SHIELD | Attending: Emergency Medicine | Admitting: Emergency Medicine

## 2019-04-30 ENCOUNTER — Emergency Department (HOSPITAL_COMMUNITY): Payer: BLUE CROSS/BLUE SHIELD

## 2019-04-30 ENCOUNTER — Encounter (HOSPITAL_COMMUNITY): Payer: Self-pay | Admitting: Emergency Medicine

## 2019-04-30 DIAGNOSIS — Z8546 Personal history of malignant neoplasm of prostate: Secondary | ICD-10-CM | POA: Diagnosis not present

## 2019-04-30 DIAGNOSIS — I10 Essential (primary) hypertension: Secondary | ICD-10-CM | POA: Diagnosis not present

## 2019-04-30 DIAGNOSIS — U071 COVID-19: Secondary | ICD-10-CM | POA: Diagnosis not present

## 2019-04-30 DIAGNOSIS — Z8673 Personal history of transient ischemic attack (TIA), and cerebral infarction without residual deficits: Secondary | ICD-10-CM | POA: Diagnosis not present

## 2019-04-30 DIAGNOSIS — E119 Type 2 diabetes mellitus without complications: Secondary | ICD-10-CM | POA: Insufficient documentation

## 2019-04-30 DIAGNOSIS — Z7984 Long term (current) use of oral hypoglycemic drugs: Secondary | ICD-10-CM | POA: Diagnosis not present

## 2019-04-30 DIAGNOSIS — R05 Cough: Secondary | ICD-10-CM | POA: Diagnosis present

## 2019-04-30 LAB — CBC
HCT: 44.6 % (ref 39.0–52.0)
Hemoglobin: 14.6 g/dL (ref 13.0–17.0)
MCH: 30.4 pg (ref 26.0–34.0)
MCHC: 32.7 g/dL (ref 30.0–36.0)
MCV: 92.9 fL (ref 80.0–100.0)
Platelets: 315 10*3/uL (ref 150–400)
RBC: 4.8 MIL/uL (ref 4.22–5.81)
RDW: 12.5 % (ref 11.5–15.5)
WBC: 4.4 10*3/uL (ref 4.0–10.5)
nRBC: 0 % (ref 0.0–0.2)

## 2019-04-30 LAB — URINALYSIS, ROUTINE W REFLEX MICROSCOPIC
Bilirubin Urine: NEGATIVE
Glucose, UA: NEGATIVE mg/dL
Hgb urine dipstick: NEGATIVE
Ketones, ur: 5 mg/dL — AB
Leukocytes,Ua: NEGATIVE
Nitrite: NEGATIVE
Protein, ur: 30 mg/dL — AB
Specific Gravity, Urine: 1.026 (ref 1.005–1.030)
pH: 5 (ref 5.0–8.0)

## 2019-04-30 LAB — BASIC METABOLIC PANEL
Anion gap: 12 (ref 5–15)
BUN: 8 mg/dL (ref 6–20)
CO2: 25 mmol/L (ref 22–32)
Calcium: 9.3 mg/dL (ref 8.9–10.3)
Chloride: 97 mmol/L — ABNORMAL LOW (ref 98–111)
Creatinine, Ser: 1.09 mg/dL (ref 0.61–1.24)
GFR calc Af Amer: >60 mL/min (ref >60)
GFR calc non Af Amer: >60 mL/min (ref >60)
Glucose, Bld: 142 mg/dL — ABNORMAL HIGH (ref 70–99)
Potassium: 5 mmol/L (ref 3.5–5.1)
Sodium: 134 mmol/L — ABNORMAL LOW (ref 135–145)

## 2019-04-30 LAB — CBG MONITORING, ED: Glucose-Capillary: 150 mg/dL — ABNORMAL HIGH (ref 70–99)

## 2019-04-30 MED ORDER — HYDROCODONE-HOMATROPINE 5-1.5 MG/5ML PO SYRP: ORAL_SOLUTION | ORAL | 0 refills | Status: DC

## 2019-04-30 MED ORDER — SODIUM CHLORIDE 0.9% FLUSH: 3.0000 mL | Freq: Once | INTRAVENOUS | Status: DC

## 2019-04-30 MED ORDER — ALBUTEROL SULFATE HFA 108 (90 BASE) MCG/ACT IN AERS
2.0000 | INHALATION_SPRAY | RESPIRATORY_TRACT | Status: DC
  Administered 2019-04-30: 2 via RESPIRATORY_TRACT
  Filled 2019-04-30: qty 6.7

## 2019-04-30 NOTE — ED Notes (Signed)
MD Pfeiffer at bedside ° °

## 2019-04-30 NOTE — Discharge Instructions (Signed)
1.  You may use the albuterol inhaler every 4-6 hours for coughing or wheezing.  You may take Hycodan cough syrup every 6 hours if needed for severe coughing that is bothersome. 2.  Return to the emergency department if you are getting worse. 3.  Make an appointment for a televisit with your doctor within the next 2 to 3 days for continued monitoring. 4.  You may take over-the-counter Tylenol for fever or body aches.  Continue your regularly prescribed medications.

## 2019-04-30 NOTE — ED Triage Notes (Signed)
Pt arrives with wife, who is COVID positive, also COVID+ here to get checked out for ongoing cough, subjective fever, generalized weakness. Pt alert, oriented x4, VSS.

## 2019-04-30 NOTE — ED Provider Notes (Signed)
[facility] EMERGENCY DEPARTMENT Provider Note   CSN: [id number] Arrival date & time: 04/30/19  1358     History   Chief Complaint Chief Complaint  Patient presents with  . Weakness  . Cough    HPI Jose Bishop is a 53 y.o. male.     HPI Patient reports that he started getting sick 10 days ago.  He started with fatigue and loss of appetite.  3 days later he and his family were tested for coronavirus.  They did test positive last Friday.  Patient's wife had a syncopal episode today and was coming to the emergency department.  Patient reports that because she was coming in he also came to get checked as well.  He reports that he has been coughing now for about 3 days.  Initially he really did not have much cough associated with this.  Patient reports he is not really short of breath.  He is not getting short of breath with exertion.  He however will go into coughing fits if he tries to take a deep breath.  He reports he still has diminished appetite but is taking fluids.  He reports his taste is altered. Past Medical History:  Diagnosis Date  . Arthritis   . Diabetes ([place])    NOT TAKEN ANY DIABETIC MED X 1 MONTH  . Difficulty sleeping   . Headache   . Hypertension    PT STOPPED BP MED 1 MONTH AGO ("BP WAS NORMAL")  . Memory loss   . Prostate cancer ([place])   . TIA (transient ischemic attack)    15 YRS AGO    Patient Active Problem List   Diagnosis Date Noted  . Hyperlipidemia 04/18/2017  . Atypical chest pain 01/24/2017  . Prostate cancer ([place]) 01/04/2015  . Stabbing headache 10/02/2014  . History of recurrent TIAs 10/02/2014  . Family history of cerebral aneurysm 10/02/2014    Past Surgical History:  Procedure Laterality Date  . HERNIA REPAIR     X2 ( 1 ING HERNIA / 1 UMBILICAL HERNIA )  . LYMPHADENECTOMY Bilateral 01/04/2015   Procedure: BILATERAL LYMPHADENECTOMY;  Surgeon: Raynelle Bring, MD;  Location: [facility];  Service: Urology;  Laterality:  Bilateral;  . MASS EXCISION     L UPPER ARM  . ROBOT ASSISTED LAPAROSCOPIC RADICAL PROSTATECTOMY N/A 01/04/2015   Procedure: ROBOTIC ASSISTED LAPAROSCOPIC RADICAL PROSTATECTOMY LEVEL 2;  Surgeon: Raynelle Bring, MD;  Location: [facility];  Service: Urology;  Laterality: N/A;  . SURGERY SCROTAL / TESTICULAR          Home Medications    Prior to Admission medications   Medication Sig Start Date End Date Taking? Authorizing Provider  atorvastatin (LIPITOR) 40 MG tablet Take 1 tablet (40 mg total) by mouth daily. 04/18/17 07/17/17  Lorretta Harp, MD  HYDROcodone-homatropine [facility]) 5-1.5 MG/5ML syrup 1-2 teaspoon every 6 hours as needed for cough 04/30/19   Charlesetta Shanks, MD  metFORMIN (GLUCOPHAGE) 500 MG tablet Take 500 mg by mouth 2 (two) times daily with a meal.    [provider]    Family History Family History  Problem Relation Age of Onset  . Anuerysm Mother   . Stroke Mother   . Hypertension Father   . Peripheral vascular disease Father   . Diabetes Sister   . Heart murmur Sister     Social History Social History   Tobacco Use  . Smoking status: Never Smoker  . Smokeless tobacco: Never Used  Substance  Use Topics  . Alcohol use: Yes    Alcohol/week: 0.0 standard drinks    Comment: OCCASIONAL  . Drug use: No     Allergies   Dapagliflozin-metformin hcl er   Review of Systems Review of Systems 10 Systems reviewed and are negative for acute change except as noted in the HPI.   Physical Exam Updated Vital Signs BP (!) 167/109 (BP Location: Right Arm)   Pulse 82   Temp 98.9 F (37.2 C) (Oral)   Resp 20   SpO2 95%   Physical Exam Constitutional:      Comments: Alert and appropriate.  No respiratory distress at rest.  Mental status clear.  HENT:     Head: Normocephalic and atraumatic.  Eyes:     Extraocular Movements: Extraocular movements intact.     Pupils: Pupils are equal, round, and reactive to light.  Cardiovascular:     Rate and  Rhythm: Normal rate and regular rhythm.     Pulses: Normal pulses.  Pulmonary:     Comments: No respiratory distress at rest.  Oxygen saturations ranging from 92% to 97% on room air.  With deep inspiration patient developed paroxysmal cough.  Auscultation is grossly clear with some occasional crackle at the bases. Abdominal:     General: Abdomen is flat. There is no distension.     Palpations: Abdomen is soft.     Tenderness: There is no abdominal tenderness. There is no guarding.  Musculoskeletal: Normal range of motion.        General: No swelling.     Right lower leg: No edema.     Left lower leg: No edema.  Skin:    General: Skin is warm and dry.  Neurological:     General: No focal deficit present.     Mental Status: He is oriented to person, place, and time.     Coordination: Coordination normal.  Psychiatric:        Mood and Affect: Mood normal.      ED Treatments / Results  Labs (all labs ordered are listed, but only abnormal results are displayed) Labs Reviewed  BASIC METABOLIC PANEL - Abnormal; Notable for the following components:      Result Value   Sodium 134 (*)    Chloride 97 (*)    Glucose, Bld 142 (*)    All other components within normal limits  URINALYSIS, ROUTINE W REFLEX MICROSCOPIC - Abnormal; Notable for the following components:   APPearance HAZY (*)    Ketones, ur 5 (*)    Protein, ur 30 (*)    Bacteria, UA RARE (*)    All other components within normal limits  CBG MONITORING, ED - Abnormal; Notable for the following components:   Glucose-Capillary 150 (*)    All other components within normal limits  CBC    EKG EKG Interpretation  Date/Time:  Wednesday April 30 2019 14:35:36 EST Ventricular Rate:  77 PR Interval:  162 QRS Duration: 88 QT Interval:  370 QTC Calculation: 418 R Axis:   5 Text Interpretation: Normal sinus rhythm no change from old Confirmed by Charlesetta Shanks [id number]) on 04/30/2019 9:51:59 PM   Radiology Dg Chest  Portable 1 View  Result Date: 04/30/2019 CLINICAL DATA:  53 year old male with a history of fever and cough with positive COVID test EXAM: PORTABLE CHEST 1 VIEW COMPARISON:  01/26/2016 FINDINGS: Cardiomediastinal silhouette unchanged in size and contour. Low lung volumes. Reticulonodular opacities of the bilateral lungs, increased from the prior. No pneumothorax.  No pleural effusion. No displaced fracture IMPRESSION: Low lung volumes with bilateral reticulonodular opacities, compatible with multifocal infection and/or atelectasis. Electronically Signed   By: Corrie Mckusick D.O.   On: 04/30/2019 18:57    Procedures Procedures (including critical care time)  Medications Ordered in ED Medications  sodium chloride flush (NS) 0.9 % injection 3 mL (3 mLs Intravenous Not Given 04/30/19 2144)  albuterol (VENTOLIN HFA) 108 (90 Base) MCG/ACT inhaler 2 puff (2 puffs Inhalation Given 04/30/19 2142)     Initial Impression / Assessment and Plan / ED Course  I have reviewed the triage vital signs and the nursing notes.  Pertinent labs & imaging results that were available during my care of the patient were reviewed by me and considered in my medical decision making (see chart for details).        RANJEET VANNEST was evaluated in Emergency Department on 04/30/2019 for the symptoms described in the history of present illness. He was evaluated in the context of the global COVID-19 pandemic, which necessitated consideration that the patient might be at risk for infection with the SARS-CoV-2 virus that causes COVID-19. Institutional protocols and algorithms that pertain to the evaluation of patients at risk for COVID-19 are in a state of rapid change based on information released by regulatory bodies including the CDC and federal and state organizations. These policies and algorithms were followed during the patient's care in the ED.  Patient is at 10 days of illness.  His general appearance is good.  He is not in  any respiratory distress.  Chest x-ray shows some mild infiltrate but is grossly clear.  Oxygen saturations are stable.  Patient does not perceive shortness of breath.  At this time I feel he is stable for continued home management with symptomatic treatment.  Will give albuterol and Hycodan to help with paroxysmal cough.  Return precautions reviewed. Final Clinical Impressions(s) / ED Diagnoses   Final diagnoses:  COVID-19 virus infection    ED Discharge Orders         Ordered    HYDROcodone-homatropine (HYCODAN) 5-1.5 MG/5ML syrup     04/30/19 2136           Charlesetta Shanks, MD 04/30/19 2153

## 2019-09-16 ENCOUNTER — Encounter: Payer: Self-pay | Admitting: Cardiovascular Disease

## 2019-09-16 ENCOUNTER — Ambulatory Visit (INDEPENDENT_AMBULATORY_CARE_PROVIDER_SITE_OTHER): Payer: 59 | Admitting: Cardiovascular Disease

## 2019-09-16 ENCOUNTER — Encounter: Payer: Self-pay | Admitting: *Deleted

## 2019-09-16 ENCOUNTER — Other Ambulatory Visit: Payer: Self-pay

## 2019-09-16 VITALS — BP 126/94 | HR 75 | Ht 75.0 in | Wt 250.8 lb

## 2019-09-16 DIAGNOSIS — E782 Mixed hyperlipidemia: Secondary | ICD-10-CM

## 2019-09-16 DIAGNOSIS — I712 Thoracic aortic aneurysm, without rupture, unspecified: Secondary | ICD-10-CM | POA: Insufficient documentation

## 2019-09-16 DIAGNOSIS — I493 Ventricular premature depolarization: Secondary | ICD-10-CM | POA: Diagnosis not present

## 2019-09-16 DIAGNOSIS — R0789 Other chest pain: Secondary | ICD-10-CM

## 2019-09-16 NOTE — Assessment & Plan Note (Signed)
History of atypical chest pain with a Myoview performed 02/02/2017 showing anteroapical ischemia with subsequent coronary CTA performed 05/11/2017 which was entirely normal.  His aortic root covered it measured 42 mm.  We will repeat a chest CTA.

## 2019-09-16 NOTE — Patient Instructions (Addendum)
Medication Instructions:  NO CHANGE *If you need a refill on your cardiac medications before your next appointment, please call your pharmacy*   Lab Work: If you have labs (blood work) drawn today and your tests are completely normal, you will receive your results only by: Marland Kitchen MyChart Message (if you have MyChart) OR . A paper copy in the mail If you have any lab test that is abnormal or we need to change your treatment, we will call you to review the results.   Testing/Procedures: CTA OF THE CHEST W/WO TO FOLLOW UP DILATED AORTIC ROOT=[address]=[facility]  ZIO XT- Long Term Monitor Instructions   Your physician has requested you wear your ZIO patch monitor___14____days.   This is a single patch monitor.  [employer] supplies one patch monitor per enrollment.  Additional stickers are not available.   Please do not apply patch if you will be having a Nuclear Stress Test, Echocardiogram, Cardiac CT, MRI, or Chest Xray during the time frame you would be wearing the monitor. The patch cannot be worn during these tests.  You cannot remove and re-apply the ZIO XT patch monitor.   Your ZIO patch monitor will be sent USPS Priority mail from [facility] directly to your home address. The monitor may also be mailed to a PO BOX if home delivery is not available.   It may take 3-5 days to receive your monitor after you have been enrolled.   Once you have received you monitor, please review enclosed instructions.  Your monitor has already been registered assigning a specific monitor serial # to you.   Applying the monitor   Shave hair from upper left chest.   Hold abrader disc by orange tab.  Rub abrader in 40 strokes over left upper chest as indicated in your monitor instructions.   Clean area with 4 enclosed alcohol pads .  Use all pads to assure are is cleaned thoroughly.  Let dry.   Apply patch as indicated in monitor instructions.  Patch will be place under collarbone  on left side of chest with arrow pointing upward.   Rub patch adhesive wings for 2 minutes.Remove white label marked "1".  Remove white label marked "2".  Rub patch adhesive wings for 2 additional minutes.   While looking in a mirror, press and release button in center of patch.  A small green light will flash 3-4 times .  This will be your only indicator the monitor has been turned on.     Do not shower for the first 24 hours.  You may shower after the first 24 hours.   Press button if you feel a symptom. You will hear a small click.  Record Date, Time and Symptom in the Patient Log Book.   When you are ready to remove patch, follow instructions on last 2 pages of Patient Log Book.  Stick patch monitor onto last page of Patient Log Book.   Place Patient Log Book in [place] box.  Use locking tab on box and tape box closed securely.  The [employer] has [employer] on it.  Please place in mailbox as soon as possible.  Your physician should have your test results approximately 7 days after the monitor has been mailed back to [facility].   Call [place] at [phone] if you have questions regarding your ZIO XT patch monitor.  Call them immediately if you see an orange light blinking on your monitor.  If your monitor falls off in less than 4 days contact our Monitor department at [phone].  If your monitor becomes loose or falls off after 4 days call [employer] at [phone] for suggestions on securing your monitor.      Follow-Up: At [facility], you and your health needs are our priority.  As part of our continuing mission to provide you with exceptional heart care, we have created designated Provider Care Teams.  These Care Teams include your primary Cardiologist (physician) and Advanced Practice Providers (APPs -  Physician Assistants and Nurse Practitioners) who all work together to provide you with the care you need, when you need it.  We  recommend signing up for the patient portal called "MyChart".  Sign up information is provided on this After Visit Summary.  MyChart is used to connect with patients for Virtual Visits (Telemedicine).  Patients are able to view lab/test results, encounter notes, upcoming appointments, etc.  Non-urgent messages can be sent to your provider as well.   To learn more about what you can do with MyChart, go to [email or web address].    Your next appointment:   AS NEEDED

## 2019-09-16 NOTE — Progress Notes (Signed)
Patient ID: Jose Bishop, male   DOB: 10-07-1965, 54 y.o.   MRN: [id number] Patient enrolled for 14 day ZIO XT long term holter monitor to be mailed to his home.

## 2019-09-16 NOTE — Progress Notes (Signed)
09/16/2019 Jose Bishop   Oct 20, 1965  [id number]  Primary Physician Salvatore Marvel, PA-C Primary Cardiologist: Lorretta Harp MD Lupe Carney, [place]  HPI:  Jose Bishop is a 54 y.o.  moderately overweight married African-American male father of 55, grandfather and one grandchild who was a truck driver and currently works in [employer]. He self-referred for evaluation of new onset chest pain .I last saw him in the office 01/24/2017.  He has had 3 episodes that lasted days a time with radiation to his neck and left upper extremity. He really has no risk factors other than recently diagnosed type 2 diabetes.  I performed a Myoview stress test on him 02/02/2017 that showed apical ischemia subsequent coronary CTA 05/11/2017 showed essentially a coronary calcium score of 1 with no evidence of CAD.  He did however have a 42-minute millimeter ascending thoracic aorta.  He was admitted to [facility] apparently last year for "bradycardia" and PVCs.  He apparently wore a Zio patch at that time although the results are unavailable to me.  He does complain of occasional palpitations and continues to have atypical chest pain.   Current Meds  Medication Sig  . atorvastatin (LIPITOR) 40 MG tablet Take 1 tablet (40 mg total) by mouth daily.  . metFORMIN (GLUCOPHAGE) 500 MG tablet Take 500 mg by mouth 2 (two) times daily with a meal.  . OZEMPIC, 0.25 OR 0.5 MG/DOSE, 2 MG/1.5ML SOPN      Allergies  Allergen Reactions  . Bee Venom Anaphylaxis  . Dapagliflozin-Metformin Hcl Er     Social History   Socioeconomic History  . Marital status: Married    Spouse name: Not on file  . Number of children: Not on file  . Years of education: Not on file  . Highest education level: Not on file  Occupational History  . Not on file  Tobacco Use  . Smoking status: Never Smoker  . Smokeless tobacco: Never Used  Substance and Sexual Activity  . Alcohol use: Yes    Alcohol/week:  0.0 standard drinks    Comment: OCCASIONAL  . Drug use: No  . Sexual activity: Yes    Partners: Female  Other Topics Concern  . Not on file  Social History Narrative  . Not on file   Social Determinants of Health   Financial Resource Strain:   . Difficulty of Paying Living Expenses:   Food Insecurity:   . Worried About Charity fundraiser in the Last Year:   . Arboriculturist in the Last Year:   Transportation Needs:   . Film/video editor (Medical):   Marland Kitchen Lack of Transportation (Non-Medical):   Physical Activity:   . Days of Exercise per Week:   . Minutes of Exercise per Session:   Stress:   . Feeling of Stress :   Social Connections:   . Frequency of Communication with Friends and Family:   . Frequency of Social Gatherings with Friends and Family:   . Attends Religious Services:   . Active Member of Clubs or Organizations:   . Attends Archivist Meetings:   Marland Kitchen Marital Status:   Intimate Partner Violence:   . Fear of Current or Ex-Partner:   . Emotionally Abused:   Marland Kitchen Physically Abused:   . Sexually Abused:      Review of Systems: General: negative for chills, fever, night sweats or weight changes.  Cardiovascular: negative for chest pain, dyspnea  on exertion, edema, orthopnea, palpitations, paroxysmal nocturnal dyspnea or shortness of breath Dermatological: negative for rash Respiratory: negative for cough or wheezing Urologic: negative for hematuria Abdominal: negative for nausea, vomiting, diarrhea, bright red blood per rectum, melena, or hematemesis Neurologic: negative for visual changes, syncope, or dizziness All other systems reviewed and are otherwise negative except as noted above.    Blood pressure (!) 126/94, pulse 75, height 6\' 3"  (1.905 m), weight 250 lb 12.8 oz (113.8 kg), SpO2 98 %.  General appearance: alert and no distress Neck: no adenopathy, no carotid bruit, no JVD, supple, symmetrical, trachea midline and thyroid not enlarged,  symmetric, no tenderness/mass/nodules Lungs: clear to auscultation bilaterally Heart: regular rate and rhythm, S1, S2 normal, no murmur, click, rub or gallop Extremities: extremities normal, atraumatic, no cyanosis or edema Pulses: 2+ and symmetric Skin: Skin color, texture, turgor normal. No rashes or lesions Neurologic: Alert and oriented X 3, normal strength and tone. Normal symmetric reflexes. Normal coordination and gait  EKG sinus rhythm 75 with nonspecific ST and T wave changes.  I personally reviewed this EKG.  ASSESSMENT AND PLAN:   Atypical chest pain History of atypical chest pain with a Myoview performed 02/02/2017 showing anteroapical ischemia with subsequent coronary CTA performed 05/11/2017 which was entirely normal.  His aortic root covered it measured 42 mm.  We will repeat a chest CTA.  Hyperlipidemia History of hyperlipidemia on statin therapy which she takes intermittently followed by his PCP.  Thoracic aortic aneurysm [facility]) Thoracic aorta measured 42 mm at the time of his coronary CTA in 2018.  We will recheck a thoracic CTA.      Lorretta Harp MD FACP,FACC,FAHA, [facility] 09/16/2019 4:59 PM

## 2019-09-16 NOTE — Assessment & Plan Note (Signed)
Thoracic aorta measured 42 mm at the time of his coronary CTA in 2018.  We will recheck a thoracic CTA.

## 2019-09-16 NOTE — Assessment & Plan Note (Signed)
History of hyperlipidemia on statin therapy which she takes intermittently followed by his PCP.

## 2019-09-17 ENCOUNTER — Telehealth: Payer: Self-pay | Admitting: Cardiovascular Disease

## 2019-09-17 ENCOUNTER — Other Ambulatory Visit: Payer: Self-pay | Admitting: *Deleted

## 2019-09-17 DIAGNOSIS — I493 Ventricular premature depolarization: Secondary | ICD-10-CM

## 2019-09-17 DIAGNOSIS — Z01818 Encounter for other preprocedural examination: Secondary | ICD-10-CM

## 2019-09-17 NOTE — Telephone Encounter (Signed)
Left message for patient to call regarding appointment for CTA chest / aorta ordered by Dr. Gwenlyn Found

## 2019-09-17 NOTE — Telephone Encounter (Signed)
Spoke with pt who state he is also taking HCTZ 12.5 mg daily and wanted to make MD aware.

## 2019-09-17 NOTE — Telephone Encounter (Signed)
Called patient to schedule CTA chest / aorta ordered by Dr. Alberteen Sam states he is taking another blood pressure medication that he did not tell Dr. Gwenlyn Found about yesterday---he takes HCTZ 12.5 mg I daily.

## 2019-09-17 NOTE — Telephone Encounter (Signed)
Spoke with patient regarding appointment for CTA chest aorta scheduled Thursday 10/02/19 at 1:40 pm [facility] [address]----arrival time is 1:20 pm---liquids only 4 hours prior --patient to come in 1 week prior for lab work.

## 2019-09-24 ENCOUNTER — Encounter: Payer: Self-pay | Admitting: Internal Medicine

## 2019-09-25 ENCOUNTER — Encounter: Payer: Self-pay | Admitting: *Deleted

## 2019-09-25 ENCOUNTER — Other Ambulatory Visit: Payer: Self-pay

## 2019-09-25 DIAGNOSIS — Z01818 Encounter for other preprocedural examination: Secondary | ICD-10-CM

## 2019-09-25 DIAGNOSIS — I493 Ventricular premature depolarization: Secondary | ICD-10-CM

## 2019-09-25 NOTE — Progress Notes (Signed)
Patient ID: Jose Bishop, male   DOB: 1965-11-10, 54 y.o.   MRN: [id number] Patient returned ZIO patch monitor to [facility] office unused.  Patient does not want to wear monitor.  Monitor to be sent via interoffice mail to [facility] office to return to [place].  Order to be cancelled.

## 2019-09-26 LAB — BASIC METABOLIC PANEL
BUN/Creatinine Ratio: 14 (ref 9–20)
BUN: 15 mg/dL (ref 6–24)
CO2: 25 mmol/L (ref 20–29)
Calcium: 10.5 mg/dL — ABNORMAL HIGH (ref 8.7–10.2)
Chloride: 101 mmol/L (ref 96–106)
Creatinine, Ser: 1.1 mg/dL (ref 0.76–1.27)
GFR calc Af Amer: 88 mL/min/{1.73_m2} (ref >59)
GFR calc non Af Amer: 76 mL/min/{1.73_m2} (ref >59)
Glucose: 98 mg/dL (ref 65–99)
Potassium: 5.4 mmol/L — ABNORMAL HIGH (ref 3.5–5.2)
Sodium: 140 mmol/L (ref 134–144)

## 2019-10-02 ENCOUNTER — Other Ambulatory Visit: Payer: Self-pay

## 2019-10-02 ENCOUNTER — Encounter: Payer: Self-pay | Admitting: *Deleted

## 2019-10-02 ENCOUNTER — Ambulatory Visit
Admission: RE | Admit: 2019-10-02 | Discharge: 2019-10-02 | Disposition: A | Discharge status: Home / Self Care | Payer: 59 | Source: Ambulatory Visit | Attending: Cardiovascular Disease | Admitting: Cardiovascular Disease

## 2019-10-02 DIAGNOSIS — I712 Thoracic aortic aneurysm, without rupture, unspecified: Secondary | ICD-10-CM

## 2019-10-02 MED ORDER — IOPAMIDOL (ISOVUE-370) INJECTION 76%
75.0000 mL | Freq: Once | INTRAVENOUS | Status: AC | PRN
  Administered 2019-10-02: 75 mL via INTRAVENOUS

## 2019-10-02 NOTE — Progress Notes (Signed)
Patient ID: Jose Bishop, male   DOB: 26-Jun-1966, 54 y.o.   MRN: [id number] Jose Bishop [id number] mailed back to [facility] from [place] office unused.

## 2019-10-03 ENCOUNTER — Telehealth: Payer: Self-pay

## 2019-10-03 NOTE — Patient Instructions (Signed)
Thank you for choosing Primary Care at [facility] to be your medical home!    Jose Bishop was seen by Catherine L Wallace, DO today.   Jose Bishop's primary care provider is Catherine Wallace, DO.   For the best care possible, you should try to see Catherine Wallace, DO whenever you come to the clinic.   We look forward to seeing you again soon!  If you have any questions about your visit today, please call us at [phone] or feel free to reach your primary care provider via MyChart.    

## 2019-10-03 NOTE — Telephone Encounter (Signed)

## 2019-10-03 NOTE — Telephone Encounter (Signed)
Spoke to patient he wanted to know if he needed to take a aspirin daily.Dr.Berry advised no aspirin needed.

## 2019-10-06 ENCOUNTER — Other Ambulatory Visit: Payer: 59

## 2019-10-06 ENCOUNTER — Telehealth (INDEPENDENT_AMBULATORY_CARE_PROVIDER_SITE_OTHER): Payer: 59 | Admitting: Internal Medicine

## 2019-10-06 ENCOUNTER — Other Ambulatory Visit: Payer: Self-pay

## 2019-10-06 DIAGNOSIS — Z794 Long term (current) use of insulin: Secondary | ICD-10-CM | POA: Insufficient documentation

## 2019-10-06 DIAGNOSIS — E782 Mixed hyperlipidemia: Secondary | ICD-10-CM | POA: Diagnosis not present

## 2019-10-06 DIAGNOSIS — E119 Type 2 diabetes mellitus without complications: Secondary | ICD-10-CM

## 2019-10-06 DIAGNOSIS — I10 Essential (primary) hypertension: Secondary | ICD-10-CM | POA: Insufficient documentation

## 2019-10-06 DIAGNOSIS — Z114 Encounter for screening for human immunodeficiency virus [HIV]: Secondary | ICD-10-CM

## 2019-10-06 DIAGNOSIS — C61 Malignant neoplasm of prostate: Secondary | ICD-10-CM

## 2019-10-06 DIAGNOSIS — Z7689 Persons encountering health services in other specified circumstances: Secondary | ICD-10-CM

## 2019-10-06 MED ORDER — OZEMPIC (0.25 OR 0.5 MG/DOSE) 2 MG/1.5ML ~~LOC~~ SOPN: 0.5000 mL | PEN_INJECTOR | SUBCUTANEOUS | 2 refills | Status: DC

## 2019-10-06 NOTE — Progress Notes (Signed)
Virtual Visit via Telephone Note  I connected with Jose Bishop, on 10/06/2019 at 9:29 AM by telephone due to the COVID-19 pandemic and verified that I am speaking with the correct person using two identifiers.   Consent: I discussed the limitations, risks, security and privacy concerns of performing an evaluation and management service by telephone and the availability of in person appointments. I also discussed with the patient that there may be a patient responsible charge related to this service. The patient expressed understanding and agreed to proceed.   Location of Patient: Emergency planning/management officer of Provider: Home    Persons participating in Telemedicine visit: Christiopher Raiola [facility] Dr. Juleen China      History of Present Illness: Patient has a visit to establish care. Patient has a PMH significant for T2DM, HTN, hyperlipidemia, prostate cancer.   Diabetes mellitus, Type 2 Disease Monitoring             Blood Sugar Ranges: Fasting - 120s-130             Polyuria: no              Visual problems: no   Urine Microalbumin Pending, will check later today. Is not on Ace or Arb.   Last A1C: 7.0 per patient report in 2020   Medications: Metformin 500 mg BID, Ozempic 0.5 mL weekly  Medication Compliance: yes  Medication Side Effects             Hypoglycemia: no   Chronic HTN Disease Monitoring:  Home BP Monitoring - 126.76-134/80 Chest pain- no  Dyspnea- no Headache - no  Medications: HCTZ 12.5 mg  Compliance- no, reports that he does not check reliably  Lightheadedness- no  Edema- no           Past Medical History:  Diagnosis Date  . Difficulty sleeping   . Essential hypertension   . Family history of stroke   . Headache   . History of transient ischemic attack (TIA)    15 YRS AGO  . Hyperlipidemia   . Impotence of organic origin   . Memory loss   . Osteoarthritis of acromioclavicular joint   . Prostate cancer ([place])   . Type 2  diabetes mellitus with hyperglycemia (HCC)    Allergies  Allergen Reactions  . Bee Venom Anaphylaxis  . Dapagliflozin-Metformin Hcl Er     Current Outpatient Medications on File Prior to Visit  Medication Sig Dispense Refill  . atorvastatin (LIPITOR) 40 MG tablet Take 1 tablet (40 mg total) by mouth daily. 90 tablet 3  . hydrochlorothiazide (HYDRODIURIL) 12.5 MG tablet Take 12.5 mg by mouth daily.    . metFORMIN (GLUCOPHAGE) 500 MG tablet Take 500 mg by mouth 2 (two) times daily with a meal.    . OZEMPIC, 0.25 OR 0.5 MG/DOSE, 2 MG/1.5ML SOPN      No current facility-administered medications on file prior to visit.    Observations/Objective: NAD. Speaking clearly.  Work of breathing normal.  Alert and oriented. Mood appropriate.   Assessment and Plan: 1. Encounter to establish care   2. Type 2 diabetes mellitus without complication, with long-term current use of insulin (HCC) Fasting CBGs sound mostly at goal. Will check A1c and microalbumin level. Continue current medication regimen.  Counseled on Diabetic diet, my plate method, [id number] minutes of moderate intensity exercise/week Blood sugar logs with fasting goals of 80-120 mg/dl, random of less than 180 and in the event of sugars less than  60 mg/dl or greater than 400 mg/dl encouraged to notify the clinic. Advised on the need for annual eye exams, annual foot exams, Pneumonia vaccine. - OZEMPIC, 0.25 OR 0.5 MG/DOSE, 2 MG/1.5ML SOPN; Inject 0.5 mLs into the skin once a week.  Dispense: 6 mL; Refill: 2 - Hemoglobin A1c; Future - Microalbumin / creatinine urine ratio  3. Screening for HIV (human immunodeficiency virus) - HIV antibody (with reflex); Future  4. Mixed hyperlipidemia On statin therapy.  - Lipid Panel; Future  5. Prostate cancer [facility]) H/o prostate cancer, in remission. Reports he will call to schedule f/u with [facility].  - PSA  6. Benign essential HTN BP sound well controlled. Continue HCTZ and  encouraged daily compliance.  Counseled on blood pressure goal of less than 130/80, low-sodium, DASH diet, medication compliance, 150 minutes of moderate intensity exercise per week. Discussed medication compliance, adverse effects. - Comprehensive metabolic panel   Follow Up Instructions: Lab visit today    I discussed the assessment and treatment plan with the patient. The patient was provided an opportunity to ask questions and all were answered. The patient agreed with the plan and demonstrated an understanding of the instructions.   The patient was advised to call back or seek an in-person evaluation if the symptoms worsen or if the condition fails to improve as anticipated.   I provided 16 minutes total of non-face-to-face time during this encounter including median intraservice time, reviewing previous notes, investigations, ordering medications, medical decision making, coordinating care and patient verbalized understanding at the end of the visit.   Phill Myron, D.O. [facility] at [facility]  10/06/2019, 9:29 AM

## 2019-10-06 NOTE — Progress Notes (Signed)
Patient here for fasting labs. 

## 2019-10-07 ENCOUNTER — Other Ambulatory Visit: Payer: Self-pay | Admitting: Internal Medicine

## 2019-10-07 DIAGNOSIS — E119 Type 2 diabetes mellitus without complications: Secondary | ICD-10-CM

## 2019-10-07 LAB — HEMOGLOBIN A1C
Est. average glucose Bld gHb Est-mCnc: 180 mg/dL
Hgb A1c MFr Bld: 7.9 % — ABNORMAL HIGH (ref 4.8–5.6)

## 2019-10-07 LAB — LIPID PANEL
Chol/HDL Ratio: 3.9 ratio (ref 0.0–5.0)
Cholesterol, Total: 231 mg/dL — ABNORMAL HIGH (ref 100–199)
HDL: 59 mg/dL (ref >39)
LDL Chol Calc (NIH): 156 mg/dL — ABNORMAL HIGH (ref 0–99)
Triglycerides: 92 mg/dL (ref 0–149)
VLDL Cholesterol Cal: 16 mg/dL (ref 5–40)

## 2019-10-07 LAB — HIV ANTIBODY (ROUTINE TESTING W REFLEX): HIV Screen 4th Generation wRfx: NONREACTIVE

## 2019-10-07 MED ORDER — ATORVASTATIN CALCIUM 80 MG PO TABS: 80.0000 mg | ORAL_TABLET | Freq: Every day | ORAL | 1 refills | Status: DC

## 2019-10-08 NOTE — Progress Notes (Signed)
Patient notified of results & recommendations. Expressed understanding. Made follow up appt for 01/07/20

## 2019-10-10 LAB — COMPREHENSIVE METABOLIC PANEL
ALT: 20 IU/L (ref 0–44)
AST: 26 IU/L (ref 0–40)
Albumin/Globulin Ratio: 1.7 (ref 1.2–2.2)
Albumin: 4.5 g/dL (ref 3.8–4.9)
Alkaline Phosphatase: 47 IU/L (ref 39–117)
BUN/Creatinine Ratio: 13 (ref 9–20)
BUN: 14 mg/dL (ref 6–24)
Bilirubin Total: 0.5 mg/dL (ref 0.0–1.2)
CO2: 17 mmol/L — ABNORMAL LOW (ref 20–29)
Calcium: 9.7 mg/dL (ref 8.7–10.2)
Chloride: 102 mmol/L (ref 96–106)
Creatinine, Ser: 1.04 mg/dL (ref 0.76–1.27)
GFR calc Af Amer: 94 mL/min/{1.73_m2} (ref >59)
GFR calc non Af Amer: 82 mL/min/{1.73_m2} (ref >59)
Globulin, Total: 2.6 g/dL (ref 1.5–4.5)
Glucose: 128 mg/dL — ABNORMAL HIGH (ref 65–99)
Potassium: 5.1 mmol/L (ref 3.5–5.2)
Sodium: 140 mmol/L (ref 134–144)
Total Protein: 7.1 g/dL (ref 6.0–8.5)

## 2019-10-10 LAB — PSA: Prostate Specific Ag, Serum: <0.1 ng/mL (ref 0.0–4.0)

## 2019-10-10 LAB — SPECIMEN STATUS REPORT

## 2019-10-13 NOTE — Progress Notes (Signed)
Patient notified of results & recommendations. Expressed understanding.

## 2019-10-17 ENCOUNTER — Other Ambulatory Visit: Payer: Self-pay

## 2019-10-17 DIAGNOSIS — E119 Type 2 diabetes mellitus without complications: Secondary | ICD-10-CM

## 2019-10-17 DIAGNOSIS — Z794 Long term (current) use of insulin: Secondary | ICD-10-CM

## 2019-10-17 MED ORDER — OZEMPIC (0.25 OR 0.5 MG/DOSE) 2 MG/1.5ML ~~LOC~~ SOPN: 0.5000 mg | PEN_INJECTOR | SUBCUTANEOUS | 2 refills | Status: DC

## 2020-01-05 ENCOUNTER — Telehealth: Payer: Self-pay

## 2020-01-05 NOTE — Patient Instructions (Signed)
Thank you for choosing Primary Care at [facility] to be your medical home!    Jose Bishop was seen by Melina Schools, DO today.   Jose Bishop's primary care provider is Phill Myron, DO.   For the best care possible, you should try to see Phill Myron, DO whenever you come to the clinic.   We look forward to seeing you again soon!  If you have any questions about your visit today, please call us at [phone] or feel free to reach your primary care provider via [place].

## 2020-01-05 NOTE — Telephone Encounter (Signed)

## 2020-01-06 ENCOUNTER — Ambulatory Visit (INDEPENDENT_AMBULATORY_CARE_PROVIDER_SITE_OTHER): Payer: 59 | Admitting: Internal Medicine

## 2020-01-06 ENCOUNTER — Encounter: Payer: Self-pay | Admitting: Internal Medicine

## 2020-01-06 ENCOUNTER — Other Ambulatory Visit: Payer: Self-pay

## 2020-01-06 VITALS — BP 151/91 | HR 55 | Temp 97.2°F | Resp 17 | Ht 73.0 in | Wt 256.0 lb

## 2020-01-06 DIAGNOSIS — E119 Type 2 diabetes mellitus without complications: Secondary | ICD-10-CM

## 2020-01-06 DIAGNOSIS — I10 Essential (primary) hypertension: Secondary | ICD-10-CM | POA: Diagnosis not present

## 2020-01-06 DIAGNOSIS — E782 Mixed hyperlipidemia: Secondary | ICD-10-CM

## 2020-01-06 DIAGNOSIS — Z1159 Encounter for screening for other viral diseases: Secondary | ICD-10-CM | POA: Diagnosis not present

## 2020-01-06 DIAGNOSIS — Z23 Encounter for immunization: Secondary | ICD-10-CM | POA: Diagnosis not present

## 2020-01-06 DIAGNOSIS — F4323 Adjustment disorder with mixed anxiety and depressed mood: Secondary | ICD-10-CM

## 2020-01-06 LAB — POCT GLYCOSYLATED HEMOGLOBIN (HGB A1C): Hemoglobin A1C: 7.8 % — AB (ref 4.0–5.6)

## 2020-01-06 LAB — GLUCOSE, POCT (MANUAL RESULT ENTRY): POC Glucose: 167 mg/dl — AB (ref 70–99)

## 2020-01-06 MED ORDER — HYDROCHLOROTHIAZIDE 12.5 MG PO TABS: 25.0000 mg | ORAL_TABLET | Freq: Every day | ORAL | 1 refills | Status: DC

## 2020-01-06 NOTE — Progress Notes (Signed)
Subjective:    Jose Bishop - 54 y.o. male MRN [id number]  Date of birth: 06/12/1966  HPI  Jose Bishop is here for follow up of chronic medical conditions.  Diabetes mellitus, Type 2 Reports that he cheated last month with trips to [place] and [place] and lots of eating.  Disease Monitoring             Blood Sugar Ranges: Fasting - 110-120s              Polyuria: no             Visual problems: no   Urine Microalbumin: will obtain today   Last A1C: 7.9 (April 2021)   Medications: Metformin 500 mg BID, Ozempic 0.5 mg weekly  Medication Compliance: no, misses some of Metformin doses due to work schedule   Medication Side Effects             Hypoglycemia: no    Chronic HTN Disease Monitoring:  Home BP Monitoring - 130s/80s  Chest pain- no  Dyspnea- no Headache - no  Medications: HCTZ 12.5 mg  Compliance- no, stopped taking pills 2 months ago due to them expiring  Lightheadedness- no  Edema- no    Positive depression and anxiety screen today. Patient reports stressors of having been turned down for disability twice. Has not been able to work due to chronic back and shoulder pain. Limited on what he can do with exercise even though he joined the gym. 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 Daughter is going away for college.   Depression screen PHQ 2/9 01/06/2020  Decreased Interest 2  Down, Depressed, Hopeless 2  PHQ - 2 Score 4  Altered sleeping 2  Tired, decreased energy 2  Change in appetite 2  Feeling bad or failure about yourself  2  Trouble concentrating 1  Moving slowly or fidgety/restless 0  Suicidal thoughts 0  PHQ-9 Score 13   GAD 7 : Generalized Anxiety Score 01/06/2020  Nervous, Anxious, on Edge 2  Control/stop worrying 2  Worry too much - different things 2  Trouble relaxing 2  Restless 0  Easily annoyed or irritable 2  Afraid - awful might happen 2  Total GAD 7 Score 12      Health Maintenance:  Health Maintenance Due  Topic Date Due  . Hepatitis C Screening  Never done  . PNEUMOCOCCAL POLYSACCHARIDE VACCINE AGE 44-64 HIGH RISK  Never done  . COVID-19 Vaccine (1) Never done  . TETANUS/TDAP  Never done  . FOOT EXAM  12/18/2019  . URINE MICROALBUMIN  12/19/2019    -  reports that he has never smoked. He has never used smokeless tobacco. - Review of Systems: Per HPI. - Past Medical History: Patient Active Problem List   Diagnosis Date Noted  . Type 2 diabetes mellitus without complication, with long-term current use of insulin ([place]) 10/06/2019  . Benign essential HTN 10/06/2019  . Thoracic aortic aneurysm ([place]) 09/16/2019  . ED (erectile dysfunction) of organic origin 01/06/2019  . Hyperlipidemia 04/18/2017  . Atypical chest pain 01/24/2017  . Prostate cancer ([place]) 01/04/2015  . History of recurrent TIAs 10/02/2014  . Family history of cerebral aneurysm 10/02/2014   - Medications: reviewed and updated   Objective:   Physical Exam BP (!) 151/91   Pulse (!) 55   Temp (!) 97.2 F (36.2 C) (Temporal)   Resp 17   Ht 6'  1" (1.854 m)   Wt 256 lb (116.1 kg)   SpO2 97%   BMI 33.78 kg/m  Physical Exam Constitutional:      General: He is not in acute distress.    Appearance: He is not diaphoretic.  Cardiovascular:     Rate and Rhythm: Normal rate.  Pulmonary:     Effort: Pulmonary effort is normal. No respiratory distress.  Musculoskeletal:        General: Normal range of motion.  Skin:    General: Skin is warm and dry.  Neurological:     Mental Status: He is alert and oriented to person, place, and time.  Psychiatric:        Mood and Affect: Affect normal.        Judgment: Judgment normal.            Assessment & Plan:   1. Type 2 diabetes mellitus without complication, without long-term current use of insulin (HCC) A1c is 7.8. Discussed goal of <7. Patient does not want to increase Metformin as reports he is inconsistent with  taking second dose. Work on medication adherence and adherence to carb modified diet.  Counseled on Diabetic diet, my plate method, 485 minutes of moderate intensity exercise/week Blood sugar logs with fasting goals of 80-120 mg/dl, random of less than 180 and in the event of sugars less than 60 mg/dl or greater than 400 mg/dl encouraged to notify the clinic. Advised on the need for annual eye exams, annual foot exams, Pneumonia vaccine. - Glucose (CBG) - HgB A1c - Microalbumin/Creatinine Ratio, Urine - HM Diabetes Foot Exam - Pneumococcal polysaccharide vaccine 23-valent greater than or equal to 2yo subcutaneous/IM  2. Essential hypertension BP above goal; however, patient has not been compliant with HCTZ for the past couple months. Restart medication.  Counseled on blood pressure goal of less than 130/80, low-sodium, DASH diet, medication compliance, 150 minutes of moderate intensity exercise per week. Discussed medication compliance, adverse effects. - hydrochlorothiazide (HYDRODIURIL) 12.5 MG tablet; Take 2 tablets (25 mg total) by mouth daily.  Dispense: 90 tablet; Refill: 1  3. Mixed hyperlipidemia Last LDL was 156, increased Lipitor to 80 mg. Patient endorses compliance. Repeat today.  - LDL Cholesterol, Direct  4. Need for hepatitis C screening test - Hepatitis C Antibody  5. Need for prophylactic vaccination against Streptococcus pneumoniae (pneumococcus) - Pneumococcal polysaccharide vaccine 23-valent greater than or equal to 2yo subcutaneous/IM  6. Adjustment disorder with mixed anxiety and depressed mood PHQ-9 and GAD-7 score elevated. Patient having a difficult time to adjusting in changes in job, financial lifestyle, etc. Agreeable to speaking with Christa See, LCSW.     Phill Myron, D.O. 01/06/2020, 10:20 AM Primary Care at [facility]

## 2020-01-07 ENCOUNTER — Ambulatory Visit: Payer: 59 | Admitting: Internal Medicine

## 2020-01-07 LAB — MICROALBUMIN / CREATININE URINE RATIO
Creatinine, Urine: 206.1 mg/dL
Microalb/Creat Ratio: 3 mg/g creat (ref 0–29)
Microalbumin, Urine: 7.1 ug/mL

## 2020-01-07 LAB — HEPATITIS C ANTIBODY: Hep C Virus Ab: <0.1 s/co ratio (ref 0.0–0.9)

## 2020-01-07 LAB — LDL CHOLESTEROL, DIRECT: LDL Direct: 145 mg/dL — ABNORMAL HIGH (ref 0–99)

## 2020-01-08 ENCOUNTER — Other Ambulatory Visit: Payer: Self-pay | Admitting: Internal Medicine

## 2020-01-08 DIAGNOSIS — E782 Mixed hyperlipidemia: Secondary | ICD-10-CM

## 2020-01-08 MED ORDER — EZETIMIBE 10 MG PO TABS: 10.0000 mg | ORAL_TABLET | Freq: Every day | ORAL | 3 refills | Status: DC

## 2020-01-08 NOTE — Progress Notes (Signed)
Patient notified of results & recommendations. Expressed understanding.

## 2020-01-13 ENCOUNTER — Other Ambulatory Visit: Payer: Self-pay

## 2020-01-13 ENCOUNTER — Ambulatory Visit (INDEPENDENT_AMBULATORY_CARE_PROVIDER_SITE_OTHER): Payer: 59 | Admitting: Licensed Clinical Social Worker

## 2020-01-13 DIAGNOSIS — F321 Major depressive disorder, single episode, moderate: Secondary | ICD-10-CM

## 2020-01-20 NOTE — BH Specialist Note (Signed)
Integrated Behavioral Health Initial Visit  MRN: [id number] Name: Jose Bishop  Number of [place] Clinician visits:: 1/6 Session Start time: 1:30 PM  Session End time: 2:00 PM Total time: 30  Type of Service: [place] Interpretor:No. Interpretor Name and Language: NA   SUBJECTIVE: Jose Bishop is a 54 y.o. male accompanied by self Patient was referred by Dr. Juleen China for depression and anxiety. Patient reports the following symptoms/concerns: Pt reports increase in depression and anxiety symptoms triggered by chronic pain and psychosocial stressors Duration of problem: Ongoing; Severity of problem: moderate  OBJECTIVE: Mood: Depressed and Affect: Appropriate Risk of harm to self or others: No plan to harm self or others  LIFE CONTEXT: Family and Social: Pt receives support from family School/Work: Pt is employed part-time Self-Care: Pt has joined a gym to cope with stressors Life Changes: Pt experiencing chronic pain and psychosocial stressors  GOALS ADDRESSED: Patient will: 1. Reduce symptoms of: anxiety, depression and stress 2. Increase knowledge and/or ability of: coping skills and healthy habits  3. Demonstrate ability to: Increase healthy adjustment to current life circumstances and Increase adequate support systems for patient/family  INTERVENTIONS: Interventions utilized: Solution-Focused Strategies, Supportive Counseling and Psychoeducation and/or Health Education  Standardized Assessments completed: GAD-7 and PHQ 2&9  ASSESSMENT: Patient currently experiencing depression and anxiety symptoms triggered by chronic pain and psychosocial stressors. Pt receives strong support from family and denies SI/HI.    Patient may benefit from therapy. LCSW discussed correlation between one's physical and mental health, in addition, to how stress can negatively impact both. Healthy coping skills discussed. Pt has lawyer to  assist with disability appeal.  PLAN: 1. Follow up with behavioral health clinician on : Contact LCSW with additional behavioral health and/or resource needs 2. Behavioral recommendations: Utilize strategies discussed 3. Referral(s): [place] (In Clinic) 4. "From scale of 1-10, how likely are you to follow plan?":   Rebekah Chesterfield, LCSW 01/20/2020 5:06 PM

## 2020-03-25 ENCOUNTER — Ambulatory Visit (INDEPENDENT_AMBULATORY_CARE_PROVIDER_SITE_OTHER): Payer: 59 | Admitting: Physician Assistant

## 2020-03-25 ENCOUNTER — Other Ambulatory Visit: Payer: Self-pay

## 2020-03-25 VITALS — BP 131/81 | HR 86 | Temp 98.4°F | Resp 18 | Ht 75.0 in | Wt 255.0 lb

## 2020-03-25 DIAGNOSIS — F332 Major depressive disorder, recurrent severe without psychotic features: Secondary | ICD-10-CM

## 2020-03-25 DIAGNOSIS — F411 Generalized anxiety disorder: Secondary | ICD-10-CM | POA: Diagnosis not present

## 2020-03-25 DIAGNOSIS — G4700 Insomnia, unspecified: Secondary | ICD-10-CM

## 2020-03-25 DIAGNOSIS — E119 Type 2 diabetes mellitus without complications: Secondary | ICD-10-CM

## 2020-03-25 LAB — GLUCOSE, POCT (MANUAL RESULT ENTRY): POC Glucose: 149 mg/dl — AB (ref 70–99)

## 2020-03-25 MED ORDER — DULOXETINE HCL 30 MG PO CPEP: ORAL_CAPSULE | ORAL | 1 refills | Status: DC

## 2020-03-25 MED ORDER — TRAZODONE HCL 50 MG PO TABS: 25.0000 mg | ORAL_TABLET | Freq: Every evening | ORAL | 3 refills | Status: DC | PRN

## 2020-03-25 NOTE — Progress Notes (Signed)
Established Patient Office Visit  Subjective:  Patient ID: Jose Bishop, male    DOB: [birth year]  Age: 54 y.o. MRN: [id number]  CC:  Chief Complaint  Patient presents with  . Mental Health Problem    HPI Jose Bishop reports that he has continued to have depressed moods and elevated anxiety, states that he has stressors of chronic pain, financial stressors due to loss of employment.  States that he believes it is starting to affect his household including his marriage.  States that he did see the clinical counselor in July 2021 and has decided that he would like to pursue counseling at this time   Reports that he has been having difficulty sleeping, difficulty falling asleep and staying asleep, endorses thought racing and also endorses chronic pain issues.  States he is sleeping approximately 3 to 4 hours a night.  Has tried Tylenol PM but unfortunately had adverse effect of drug the entire next day.  Does take vitamin D over-the-counter on a daily basis  Reports prior to his injury that caused his chronic pain he did have a history of anxiety with increased stress at work, reports a history of anxiety attacks during this time.  Denies any anxiety attacks currently, does endorse increased irritation, easy to anger.  Adamantly denies any thoughts of self-harm PHQ-9 score 18 GAD-7 score 18   Past Medical History:  Diagnosis Date  . Difficulty sleeping   . Essential hypertension   . Family history of stroke   . Headache   . History of transient ischemic attack (TIA)    15 YRS AGO  . Hyperlipidemia   . Impotence of organic origin   . Memory loss   . Osteoarthritis of acromioclavicular joint   . Prostate cancer ([place])   . Type 2 diabetes mellitus with hyperglycemia [facility])     Past Surgical History:  Procedure Laterality Date  . HERNIA REPAIR     X2 ( 1 ING HERNIA / 1 UMBILICAL HERNIA )  . LYMPHADENECTOMY Bilateral 01/04/2015   Procedure: BILATERAL LYMPHADENECTOMY;   Surgeon: Raynelle Bring, MD;  Location: [facility];  Service: Urology;  Laterality: Bilateral;  . MASS EXCISION     L UPPER ARM  . PENILE PROSTHESIS IMPLANT  03/18/2019  . ROBOT ASSISTED LAPAROSCOPIC RADICAL PROSTATECTOMY N/A 01/04/2015   Procedure: ROBOTIC ASSISTED LAPAROSCOPIC RADICAL PROSTATECTOMY LEVEL 2;  Surgeon: Raynelle Bring, MD;  Location: [facility];  Service: Urology;  Laterality: N/A;  . ROTATOR CUFF REPAIR Bilateral   . SURGERY SCROTAL / TESTICULAR      Family History  Problem Relation Age of Onset  . Anuerysm Mother   . Stroke Mother   . Hypertension Father   . Peripheral vascular disease Father   . Stroke Father   . Diabetes Sister   . Heart murmur Sister     Social History   Socioeconomic History  . Marital status: Married    Spouse name: Not on file  . Number of children: Not on file  . Years of education: Not on file  . Highest education level: Not on file  Occupational History  . Not on file  Tobacco Use  . Smoking status: Never Smoker  . Smokeless tobacco: Never Used  Substance and Sexual Activity  . Alcohol use: Yes    Alcohol/week: 0.0 standard drinks    Comment: OCCASIONAL  . Drug use: No  . Sexual activity: Yes    Partners: Female  Other Topics Concern  . Not on  file  Social History Narrative  . Not on file   Social Determinants of Health   Financial Resource Strain:   . Difficulty of Paying Living Expenses: Not on file  Food Insecurity:   . Worried About Charity fundraiser in the Last Year: Not on file  . Ran Out of Food in the Last Year: Not on file  Transportation Needs:   . Lack of Transportation (Medical): Not on file  . Lack of Transportation (Non-Medical): Not on file  Physical Activity:   . Days of Exercise per Week: Not on file  . Minutes of Exercise per Session: Not on file  Stress:   . Feeling of Stress : Not on file  Social Connections:   . Frequency of Communication with Friends and Family: Not on file  . Frequency of Social  Gatherings with Friends and Family: Not on file  . Attends Religious Services: Not on file  . Active Member of Clubs or Organizations: Not on file  . Attends Archivist Meetings: Not on file  . Marital Status: Not on file  Intimate Partner Violence:   . Fear of Current or Ex-Partner: Not on file  . Emotionally Abused: Not on file  . Physically Abused: Not on file  . Sexually Abused: Not on file    Outpatient Medications Prior to Visit  Medication Sig Dispense Refill  . atorvastatin (LIPITOR) 80 MG tablet Take 1 tablet (80 mg total) by mouth daily. 90 tablet 1  . ezetimibe (ZETIA) 10 MG tablet Take 1 tablet (10 mg total) by mouth daily. 90 tablet 3  . hydrochlorothiazide (HYDRODIURIL) 12.5 MG tablet Take 2 tablets (25 mg total) by mouth daily. 90 tablet 1  . metFORMIN (GLUCOPHAGE) 500 MG tablet Take 500 mg by mouth 2 (two) times daily with a meal.    . OZEMPIC, 0.25 OR 0.5 MG/DOSE, 2 MG/1.5ML SOPN Inject 0.5 mg into the skin once a week. 4 pen 2   No facility-administered medications prior to visit.    Allergies  Allergen Reactions  . Bee Venom Anaphylaxis  . Dapagliflozin-Metformin Hcl Er     ROS Review of Systems  Constitutional: Negative.   HENT: Negative.   Eyes: Negative.   Respiratory: Negative.   Cardiovascular: Negative.   Gastrointestinal: Negative.   Endocrine: Negative.   Genitourinary: Negative.   Musculoskeletal: Positive for back pain and myalgias.  Skin: Negative.   Allergic/Immunologic: Negative.   Neurological: Negative.   Hematological: Negative.   Psychiatric/Behavioral: Positive for dysphoric mood and sleep disturbance. Negative for self-injury and suicidal ideas. The patient is nervous/anxious.       Objective:    Physical Exam Vitals and nursing note reviewed.  Constitutional:      Appearance: Normal appearance.  HENT:     Head: Normocephalic and atraumatic.     Right Ear: External ear normal.     Left Ear: External ear normal.      Nose: Nose normal.     Mouth/Throat:     Pharynx: Oropharynx is clear.  Eyes:     Extraocular Movements: Extraocular movements intact.     Conjunctiva/sclera: Conjunctivae normal.     Pupils: Pupils are equal, round, and reactive to light.  Cardiovascular:     Rate and Rhythm: Normal rate and regular rhythm.     Pulses: Normal pulses.     Heart sounds: Normal heart sounds.  Pulmonary:     Effort: Pulmonary effort is normal.     Breath sounds: Normal  breath sounds.  Abdominal:     General: Abdomen is flat.     Palpations: Abdomen is soft.  Musculoskeletal:        General: Normal range of motion.     Cervical back: Normal range of motion and neck supple.  Skin:    General: Skin is warm and dry.  Neurological:     General: No focal deficit present.     Mental Status: He is alert and oriented to person, place, and time. Mental status is at baseline.  Psychiatric:        Attention and Perception: Attention and perception normal.        Mood and Affect: Mood and affect normal.        Speech: Speech normal.        Behavior: Behavior normal. Behavior is cooperative.        Thought Content: Thought content normal. Thought content does not include homicidal or suicidal ideation.        Cognition and Memory: Cognition and memory normal.        Judgment: Judgment normal.     BP 131/81 (BP Location: Left Arm, Patient Position: Sitting, Cuff Size: Large)   Pulse 86   Temp 98.4 F (36.9 C) (Oral)   Resp 18   Ht 6\' 3"  (1.905 m)   Wt 255 lb (115.7 kg)   SpO2 100%   BMI 31.87 kg/m  Wt Readings from Last 3 Encounters:  03/25/20 255 lb (115.7 kg)  01/06/20 256 lb (116.1 kg)  09/16/19 250 lb 12.8 oz (113.8 kg)     Health Maintenance Due  Topic Date Due  . TETANUS/TDAP  Never done  . INFLUENZA VACCINE  Never done    There are no preventive care reminders to display for this patient.  No results found for: TSH Lab Results  Component Value Date   WBC 4.4 04/30/2019   HGB  14.6 04/30/2019   HCT 44.6 04/30/2019   MCV 92.9 04/30/2019   PLT 315 04/30/2019   Lab Results  Component Value Date   NA 140 10/06/2019   K 5.1 10/06/2019   CO2 17 (L) 10/06/2019   GLUCOSE 128 (H) 10/06/2019   BUN 14 10/06/2019   CREATININE 1.04 10/06/2019   BILITOT 0.5 10/06/2019   ALKPHOS 47 10/06/2019   AST 26 10/06/2019   ALT 20 10/06/2019   PROT 7.1 10/06/2019   ALBUMIN 4.5 10/06/2019   CALCIUM 9.7 10/06/2019   ANIONGAP 12 04/30/2019   Lab Results  Component Value Date   CHOL 231 (H) 10/06/2019   Lab Results  Component Value Date   HDL 59 10/06/2019   Lab Results  Component Value Date   LDLCALC 156 (H) 10/06/2019   Lab Results  Component Value Date   TRIG 92 10/06/2019   Lab Results  Component Value Date   CHOLHDL 3.9 10/06/2019   Lab Results  Component Value Date   HGBA1C 7.8 (A) 01/06/2020      Assessment & Plan:   Problem List Items Addressed This Visit    None    Visit Diagnoses    Type 2 diabetes mellitus without complication, without long-term current use of insulin (HCC)    -  Primary   Relevant Orders   Glucose (CBG) (Completed)   Severe episode of recurrent major depressive disorder, without psychotic features (HCC)       Relevant Medications   DULoxetine (CYMBALTA) 30 MG capsule   traZODone (DESYREL) 50 MG tablet  Other Relevant Orders   Ambulatory referral to Psychiatry   GAD (generalized anxiety disorder)       Relevant Medications   DULoxetine (CYMBALTA) 30 MG capsule   traZODone (DESYREL) 50 MG tablet   Insomnia, unspecified type       Relevant Medications   traZODone (DESYREL) 50 MG tablet    1. Type 2 diabetes mellitus without complication, without long-term current use of insulin (HCC)  - Glucose (CBG)  2. Severe episode of recurrent major depressive disorder, without psychotic features (HCC) Trial Cymbalta 30 mg week 1, increase 60 mg week 2, referral for cognitive behavioral therapy.  Patient agrees to follow-up in  mobile medicine unit in 4 weeks for evaluation of behavioral health medications Continue vitamin D over-the-counter  - DULoxetine (CYMBALTA) 30 MG capsule; Take 1 tab PO once daily for one week then take 2 tabs PO once daily  Dispense: 60 capsule; Refill: 1 - Ambulatory referral to Psychiatry  3. GAD (generalized anxiety disorder)  - traZODone (DESYREL) 50 MG tablet; Take 0.5-1 tablets (25-50 mg total) by mouth at bedtime as needed for sleep.  Dispense: 30 tablet; Refill: 3  4. Insomnia, unspecified type Trial trazodone 25 to 50 mg, patient education given on good sleep hygiene - traZODone (DESYREL) 50 MG tablet; Take 0.5-1 tablets (25-50 mg total) by mouth at bedtime as needed for sleep.  Dispense: 30 tablet; Refill: 3   Meds ordered this encounter  Medications  . DULoxetine (CYMBALTA) 30 MG capsule    Sig: Take 1 tab PO once daily for one week then take 2 tabs PO once daily    Dispense:  60 capsule    Refill:  1    Order Specific Question:   Supervising Provider    Answer:   Asencion Noble E [[id number]]  . traZODone (DESYREL) 50 MG tablet    Sig: Take 0.5-1 tablets (25-50 mg total) by mouth at bedtime as needed for sleep.    Dispense:  30 tablet    Refill:  3    Order Specific Question:   Supervising Provider    Answer:   Elsie Stain [[id number]]    I have reviewed the patient's medical history (PMH, PSH, Social History, Family History, Medications, and allergies) , and have been updated if relevant. I spent 30 minutes reviewing chart and  face to face time with patient.    Follow-up: Return in about 4 weeks (around 04/22/2020) for mobile medicine unit .    Loraine Grip Mayers, PA-C

## 2020-03-25 NOTE — Patient Instructions (Signed)
You will take Cymbalta 30 mg once a day for one week and then take 60 mg once a day.  You will use 1/2 - 1 tab of trazodone for sleep.  I will start a referral for your to start counseling.  Please come to the [place] [facility] in approx 4 weeks to review these medications.  Kennieth Rad, PA-C Physician Assistant [facility] [email or web address]   Insomnia Insomnia is a sleep disorder that makes it difficult to fall asleep or stay asleep. Insomnia can cause fatigue, low energy, difficulty concentrating, mood swings, and poor performance at work or school. There are three different ways to classify insomnia:  Difficulty falling asleep.  Difficulty staying asleep.  Waking up too early in the morning. Any type of insomnia can be long-term (chronic) or short-term (acute). Both are common. Short-term insomnia usually lasts for three months or less. Chronic insomnia occurs at least three times a week for longer than three months. What are the causes? Insomnia may be caused by another condition, situation, or substance, such as:  Anxiety.  Certain medicines.  Gastroesophageal reflux disease (GERD) or other gastrointestinal conditions.  Asthma or other breathing conditions.  Restless legs syndrome, sleep apnea, or other sleep disorders.  Chronic pain.  Menopause.  Stroke.  Abuse of alcohol, tobacco, or illegal drugs.  Mental health conditions, such as depression.  Caffeine.  Neurological disorders, such as Alzheimer's disease.  An overactive thyroid (hyperthyroidism). Sometimes, the cause of insomnia may not be known. What increases the risk? Risk factors for insomnia include:  Gender. Women are affected more often than men.  Age. Insomnia is more common as you get older.  Stress.  Lack of exercise.  Irregular work schedule or working night shifts.  Traveling between different time  zones.  Certain medical and mental health conditions. What are the signs or symptoms? If you have insomnia, the main symptom is having trouble falling asleep or having trouble staying asleep. This may lead to other symptoms, such as:  Feeling fatigued or having low energy.  Feeling nervous about going to sleep.  Not feeling rested in the morning.  Having trouble concentrating.  Feeling irritable, anxious, or depressed. How is this diagnosed? This condition may be diagnosed based on:  Your symptoms and medical history. Your health care provider may ask about: ? Your sleep habits. ? Any medical conditions you have. ? Your mental health.  A physical exam. How is this treated? Treatment for insomnia depends on the cause. Treatment may focus on treating an underlying condition that is causing insomnia. Treatment may also include:  Medicines to help you sleep.  Counseling or therapy.  Lifestyle adjustments to help you sleep better. Follow these instructions at home: Eating and drinking   Limit or avoid alcohol, caffeinated beverages, and cigarettes, especially close to bedtime. These can disrupt your sleep.  Do not eat a large meal or eat spicy foods right before bedtime. This can lead to digestive discomfort that can make it hard for you to sleep. Sleep habits   Keep a sleep diary to help you and your health care provider figure out what could be causing your insomnia. Write down: ? When you sleep. ? When you wake up during the night. ? How well you sleep. ? How rested you feel the next day. ? Any side effects of medicines you are taking. ? What you eat and drink.  Make your bedroom a dark, comfortable place where it is easy to  fall asleep. ? Put up shades or blackout curtains to block light from outside. ? Use a white noise machine to block noise. ? Keep the temperature cool.  Limit screen use before bedtime. This includes: ? Watching TV. ? Using your smartphone,  tablet, or computer.  Stick to a routine that includes going to bed and waking up at the same times every day and night. This can help you fall asleep faster. Consider making a quiet activity, such as reading, part of your nighttime routine.  Try to avoid taking naps during the day so that you sleep better at night.  Get out of bed if you are still awake after 15 minutes of trying to sleep. Keep the lights down, but try reading or doing a quiet activity. When you feel sleepy, go back to bed. General instructions  Take over-the-counter and prescription medicines only as told by your health care provider.  Exercise regularly, as told by your health care provider. Avoid exercise starting several hours before bedtime.  Use relaxation techniques to manage stress. Ask your health care provider to suggest some techniques that may work well for you. These may include: ? Breathing exercises. ? Routines to release muscle tension. ? Visualizing peaceful scenes.  Make sure that you drive carefully. Avoid driving if you feel very sleepy.  Keep all follow-up visits as told by your health care provider. This is important. Contact a health care provider if:  You are tired throughout the day.  You have trouble in your daily routine due to sleepiness.  You continue to have sleep problems, or your sleep problems get worse. Get help right away if:  You have serious thoughts about hurting yourself or someone else. If you ever feel like you may hurt yourself or others, or have thoughts about taking your own life, get help right away. You can go to your nearest emergency department or call:  Your local emergency services (911 in the [place]  A suicide crisis helpline, such as the [place] at 435-657-7323. This is open 24 hours a day. Summary  Insomnia is a sleep disorder that makes it difficult to fall asleep or stay asleep.  Insomnia can be long-term (chronic) or  short-term (acute).  Treatment for insomnia depends on the cause. Treatment may focus on treating an underlying condition that is causing insomnia.  Keep a sleep diary to help you and your health care provider figure out what could be causing your insomnia. This information is not intended to replace advice given to you by your health care provider. Make sure you discuss any questions you have with your health care provider. Document Revised: 05/25/2017 Document Reviewed: 03/22/2017 Elsevier Patient Education  2020 [employer].

## 2020-03-29 ENCOUNTER — Ambulatory Visit
Admission: EM | Admit: 2020-03-29 | Discharge: 2020-03-29 | Disposition: A | Discharge status: Home / Self Care | Payer: 59 | Attending: Family Medicine | Admitting: Family Medicine

## 2020-03-29 ENCOUNTER — Telehealth: Payer: Self-pay | Admitting: Internal Medicine

## 2020-03-29 ENCOUNTER — Other Ambulatory Visit: Payer: Self-pay

## 2020-03-29 DIAGNOSIS — R197 Diarrhea, unspecified: Secondary | ICD-10-CM | POA: Diagnosis not present

## 2020-03-29 NOTE — ED Triage Notes (Signed)
Pt states he has had diahrrea, night sweats, and abdominal pain x 3 weeks intermittently. Pt woke at 0300 last night approximately due to the pain. Pt is aox4 and ambulatory.

## 2020-03-29 NOTE — Discharge Instructions (Addendum)
May try some fiber supplement such as Citrucel or Metamucil Probiotic Acid inhibitor such as Nexium or Prilosec Most importantly follow-up with gastroenterology.

## 2020-03-29 NOTE — ED Provider Notes (Signed)
[facility]    CSN: [id number] Arrival date & time: 03/29/20  9563      History   Chief Complaint Chief Complaint  Patient presents with  . Diarrhea    3 episodes since last night  . Abdominal Pain    since last night  . Night Sweats    since last night    HPI Jose Bishop is a 54 y.o. male.   Intermittent diarrhea for the past 3 weeks.  Diarrhea is alternating with constipation.  Patient is diabetic.  There is no fever no travel he has been vaccinated for Covid.  Has no respiratory symptoms.  There is no blood in the diarrhea.  He did take some milk of magnesia about 3 weeks ago for the constipation.  HPI  Past Medical History:  Diagnosis Date  . Difficulty sleeping   . Essential hypertension   . Family history of stroke   . Headache   . History of transient ischemic attack (TIA)    15 YRS AGO  . Hyperlipidemia   . Impotence of organic origin   . Memory loss   . Osteoarthritis of acromioclavicular joint   . Prostate cancer ([place])   . Type 2 diabetes mellitus with hyperglycemia [facility])     Patient Active Problem List   Diagnosis Date Noted  . Type 2 diabetes mellitus without complication, with long-term current use of insulin ([place]) 10/06/2019  . Benign essential HTN 10/06/2019  . Thoracic aortic aneurysm ([place]) 09/16/2019  . ED (erectile dysfunction) of organic origin 01/06/2019  . History of repair of rotator cuff 04/16/2018  . Hyperlipidemia 04/18/2017  . Atypical chest pain 01/24/2017  . Prostate cancer ([place]) 01/04/2015  . History of recurrent TIAs 10/02/2014  . Family history of cerebral aneurysm 10/02/2014    Past Surgical History:  Procedure Laterality Date  . HERNIA REPAIR     X2 ( 1 ING HERNIA / 1 UMBILICAL HERNIA )  . LYMPHADENECTOMY Bilateral 01/04/2015   Procedure: BILATERAL LYMPHADENECTOMY;  Surgeon: Raynelle Bring, MD;  Location: [facility];  Service: Urology;  Laterality: Bilateral;  . MASS EXCISION     L UPPER ARM  . PENILE  PROSTHESIS IMPLANT  03/18/2019  . ROBOT ASSISTED LAPAROSCOPIC RADICAL PROSTATECTOMY N/A 01/04/2015   Procedure: ROBOTIC ASSISTED LAPAROSCOPIC RADICAL PROSTATECTOMY LEVEL 2;  Surgeon: Raynelle Bring, MD;  Location: [facility];  Service: Urology;  Laterality: N/A;  . ROTATOR CUFF REPAIR Bilateral   . SURGERY SCROTAL / TESTICULAR         Home Medications    Prior to Admission medications   Medication Sig Start Date End Date Taking? Authorizing Provider  atorvastatin (LIPITOR) 80 MG tablet Take 1 tablet (80 mg total) by mouth daily. 10/07/19  Yes Nicolette Bang, DO  hydrochlorothiazide (HYDRODIURIL) 12.5 MG tablet Take 2 tablets (25 mg total) by mouth daily. 01/06/20  Yes Nicolette Bang, DO  OZEMPIC, 0.25 OR 0.5 MG/DOSE, 2 MG/1.5ML SOPN Inject 0.5 mg into the skin once a week. 10/17/19  Yes Nicolette Bang, DO  DULoxetine (CYMBALTA) 30 MG capsule Take 1 tab PO once daily for one week then take 2 tabs PO once daily 03/25/20   Mayers, Cari S, PA-C  ezetimibe (ZETIA) 10 MG tablet Take 1 tablet (10 mg total) by mouth daily. 01/08/20   Nicolette Bang, DO  metFORMIN (GLUCOPHAGE) 500 MG tablet Take 500 mg by mouth 2 (two) times daily with a meal.    [provider]  traZODone (  DESYREL) 50 MG tablet Take 0.5-1 tablets (25-50 mg total) by mouth at bedtime as needed for sleep. 03/25/20   Mayers, Loraine Grip, PA-C    Family History Family History  Problem Relation Age of Onset  . Anuerysm Mother   . Stroke Mother   . Hypertension Father   . Peripheral vascular disease Father   . Stroke Father   . Diabetes Sister   . Heart murmur Sister     Social History Social History   Tobacco Use  . Smoking status: Never Smoker  . Smokeless tobacco: Never Used  Vaping Use  . Vaping Use: Never used  Substance Use Topics  . Alcohol use: Yes    Alcohol/week: 0.0 standard drinks    Comment: OCCASIONAL  . Drug use: No     Allergies   Bee venom and  Dapagliflozin-metformin hcl er   Review of Systems Review of Systems  Gastrointestinal: Positive for constipation and diarrhea.  All other systems reviewed and are negative.    Physical Exam Triage Vital Signs ED Triage Vitals  Enc Vitals Group     BP 03/29/20 1014 117/76     Pulse Rate 03/29/20 1014 (!) 56     Resp 03/29/20 1014 17     Temp --      Temp Source 03/29/20 1014 Oral     SpO2 03/29/20 1014 97 %     Weight --      Height --      Head Circumference --      Peak Flow --      Pain Score 03/29/20 1016 5     Pain Loc --      Pain Edu? --      Excl. in [place]? --    No data found.  Updated Vital Signs BP 117/76 (BP Location: Right Arm)   Pulse (!) 56   Resp 17   SpO2 97%   Visual Acuity Right Eye Distance:   Left Eye Distance:   Bilateral Distance:    Right Eye Near:   Left Eye Near:    Bilateral Near:     Physical Exam Vitals and nursing note reviewed.  Constitutional:      Appearance: He is well-developed. He is obese.  Cardiovascular:     Rate and Rhythm: Normal rate and regular rhythm.  Abdominal:     General: Bowel sounds are decreased.     Tenderness: There is no abdominal tenderness. There is no guarding or rebound.  Neurological:     Mental Status: He is alert.      UC Treatments / Results  Labs (all labs ordered are listed, but only abnormal results are displayed) Labs Reviewed - No data to display  EKG   Radiology No results found.  Procedures Procedures (including critical care time)  Medications Ordered in UC Medications - No data to display  Initial Impression / Assessment and Plan / UC Course  I have reviewed the triage vital signs and the nursing notes.  Pertinent labs & imaging results that were available during my care of the patient were reviewed by me and considered in my medical decision making (see chart for details).     Diarrhea and constipation.  Doubt infectious etiology.  Patient due for colonoscopy per  previous polyps given his symptoms I think colonoscopy is best advice at this time.  Also recommended probiotic PPI and bulk agent to try to find minimal of the road between diarrhea and constipation Final Clinical Impressions(s) /  UC Diagnoses   Final diagnoses:  None   Discharge Instructions   None    ED Prescriptions    None     PDMP not reviewed this encounter.   Wardell Honour, MD 03/29/20 1049

## 2020-03-29 NOTE — Telephone Encounter (Signed)
Pt wants to go to the GI dr for a coloscopy

## 2020-03-31 ENCOUNTER — Other Ambulatory Visit: Payer: Self-pay | Admitting: Internal Medicine

## 2020-03-31 DIAGNOSIS — Z1211 Encounter for screening for malignant neoplasm of colon: Secondary | ICD-10-CM

## 2020-03-31 NOTE — Telephone Encounter (Signed)
GI referral for colonoscopy placed.   Phill Myron, D.O. Primary Care at [facility]  03/31/2020, 1:58 PM

## 2020-04-01 NOTE — Telephone Encounter (Signed)
Pt wants a referral for mental health

## 2020-04-05 ENCOUNTER — Other Ambulatory Visit: Payer: Self-pay | Admitting: Internal Medicine

## 2020-04-05 NOTE — Telephone Encounter (Signed)
It looks like Cari placed a psych referral on 9/30 that is still open. We can offer a visit with Christa See, LCSW  in the meantime.   Phill Myron, D.O. Primary Care at [facility]  04/05/2020, 9:41 AM

## 2020-04-12 ENCOUNTER — Encounter: Payer: Self-pay | Admitting: Internal Medicine

## 2020-04-12 ENCOUNTER — Telehealth (INDEPENDENT_AMBULATORY_CARE_PROVIDER_SITE_OTHER): Payer: 59 | Admitting: Internal Medicine

## 2020-04-12 DIAGNOSIS — E782 Mixed hyperlipidemia: Secondary | ICD-10-CM | POA: Diagnosis not present

## 2020-04-12 DIAGNOSIS — I10 Essential (primary) hypertension: Secondary | ICD-10-CM

## 2020-04-12 DIAGNOSIS — E119 Type 2 diabetes mellitus without complications: Secondary | ICD-10-CM

## 2020-04-12 NOTE — Progress Notes (Signed)
Virtual Visit via Telephone Note  I connected with Jose Bishop, on 04/12/2020 at 10:27 AM by telephone due to the COVID-19 pandemic and verified that I am speaking with the correct person using two identifiers.   Consent: I discussed the limitations, risks, security and privacy concerns of performing an evaluation and management service by telephone and the availability of in person appointments. I also discussed with the patient that there may be a patient responsible charge related to this service. The patient expressed understanding and agreed to proceed.   Location of Patient: Home   Location of Provider: Clinic    Persons participating in Telemedicine visit: [facility] Dr. Juleen China      History of Present Illness: Patient has a visit to f/u on chronic medical conditions.   Chronic HTN Disease Monitoring:  Home BP Monitoring - 110-130s/80s  Chest pain- no  Dyspnea- no Headache - no  Medications: HCTZ 25 mg  Compliance- yes Lightheadedness- no  Edema- no   Diabetes mellitus, Type 2 Disease Monitoring             Blood Sugar Ranges: Fasting -130-140s.              Polyuria: no             Visual problems: no   Urine Microalbumin 3 (July 2021)   Last A1C: 7.8 (July 2021)   Medications: Metformin 500 mg BID, Ozempic 0.5 mg weekly  Medication Compliance: yes  Medication Side Effects             Hypoglycemia: no          Past Medical History:  Diagnosis Date  . Difficulty sleeping   . Essential hypertension   . Family history of stroke   . Headache   . History of transient ischemic attack (TIA)    15 YRS AGO  . Hyperlipidemia   . Impotence of organic origin   . Memory loss   . Osteoarthritis of acromioclavicular joint   . Prostate cancer ([place])   . Type 2 diabetes mellitus with hyperglycemia (HCC)    Allergies  Allergen Reactions  . Bee Venom Anaphylaxis  . Dapagliflozin-Metformin Hcl Er     Current  Outpatient Medications on File Prior to Visit  Medication Sig Dispense Refill  . atorvastatin (LIPITOR) 80 MG tablet Take 1 tablet (80 mg total) by mouth daily. 90 tablet 1  . DULoxetine (CYMBALTA) 30 MG capsule Take 1 tab PO once daily for one week then take 2 tabs PO once daily 60 capsule 1  . ezetimibe (ZETIA) 10 MG tablet Take 1 tablet (10 mg total) by mouth daily. 90 tablet 3  . hydrochlorothiazide (HYDRODIURIL) 12.5 MG tablet Take 2 tablets (25 mg total) by mouth daily. 90 tablet 1  . metFORMIN (GLUCOPHAGE) 500 MG tablet Take 500 mg by mouth 2 (two) times daily with a meal.    . OZEMPIC, 0.25 OR 0.5 MG/DOSE, 2 MG/1.5ML SOPN Inject 0.5 mg into the skin once a week. 4 pen 2  . traZODone (DESYREL) 50 MG tablet Take 0.5-1 tablets (25-50 mg total) by mouth at bedtime as needed for sleep. 30 tablet 3   No current facility-administered medications on file prior to visit.    Observations/Objective: NAD. Speaking clearly.  Work of breathing normal.  Alert and oriented. Mood appropriate.   Assessment and Plan: 1. Type 2 diabetes mellitus without complication, without long-term current use of insulin (HCC) Last A1c fairly well controlled  and fasting CBGs mostly within range. Continue current regimen. If A1c remains above goal of 7 or worsens, consider dose increase of Metformin or Ozempic.  Counseled on Diabetic diet, my plate method, 299 minutes of moderate intensity exercise/week Blood sugar logs with fasting goals of 80-120 mg/dl, random of less than 180 and in the event of sugars less than 60 mg/dl or greater than 400 mg/dl encouraged to notify the clinic. Advised on the need for annual eye exams, annual foot exams, Pneumonia vaccine. - Hemoglobin A1c; Future - Comprehensive metabolic panel; Future  2. Essential hypertension BP at goal. Continue current regimen.  - Lipid Panel; Future - Comprehensive metabolic panel; Future  3. Mixed hyperlipidemia Last LDL was 145. Added Zetia for  more optimal control. Continue with statin therapy and monitor.  - Lipid Panel; Future   Follow Up Instructions: Lab visit; 3 month f/u    I discussed the assessment and treatment plan with the patient. The patient was provided an opportunity to ask questions and all were answered. The patient agreed with the plan and demonstrated an understanding of the instructions.   The patient was advised to call back or seek an in-person evaluation if the symptoms worsen or if the condition fails to improve as anticipated.     I provided 28 minutes total of non-face-to-face time during this encounter including median intraservice time, reviewing previous notes, investigations, ordering medications, medical decision making, coordinating care and patient verbalized understanding at the end of the visit.    Phill Myron, D.O. Primary Care at [facility]  04/12/2020, 10:27 AM

## 2020-04-13 ENCOUNTER — Encounter: Payer: Self-pay | Admitting: Nurse Practitioner

## 2020-04-14 ENCOUNTER — Other Ambulatory Visit (INDEPENDENT_AMBULATORY_CARE_PROVIDER_SITE_OTHER): Payer: 59

## 2020-04-14 ENCOUNTER — Other Ambulatory Visit: Payer: Self-pay

## 2020-04-14 DIAGNOSIS — E782 Mixed hyperlipidemia: Secondary | ICD-10-CM

## 2020-04-14 DIAGNOSIS — E119 Type 2 diabetes mellitus without complications: Secondary | ICD-10-CM

## 2020-04-14 DIAGNOSIS — I10 Essential (primary) hypertension: Secondary | ICD-10-CM

## 2020-04-15 ENCOUNTER — Other Ambulatory Visit: Payer: Self-pay

## 2020-04-15 ENCOUNTER — Other Ambulatory Visit: Payer: Self-pay | Admitting: Physical Medicine and Rehabilitation

## 2020-04-15 DIAGNOSIS — M545 Low back pain, unspecified: Secondary | ICD-10-CM

## 2020-04-15 DIAGNOSIS — G8929 Other chronic pain: Secondary | ICD-10-CM

## 2020-04-15 DIAGNOSIS — I10 Essential (primary) hypertension: Secondary | ICD-10-CM

## 2020-04-15 LAB — COMPREHENSIVE METABOLIC PANEL
ALT: 24 IU/L (ref 0–44)
AST: 17 IU/L (ref 0–40)
Albumin/Globulin Ratio: 1.6 (ref 1.2–2.2)
Albumin: 4.6 g/dL (ref 3.8–4.9)
Alkaline Phosphatase: 59 IU/L (ref 44–121)
BUN/Creatinine Ratio: 16 (ref 9–20)
BUN: 16 mg/dL (ref 6–24)
Bilirubin Total: 0.5 mg/dL (ref 0.0–1.2)
CO2: 21 mmol/L (ref 20–29)
Calcium: 9.6 mg/dL (ref 8.7–10.2)
Chloride: 100 mmol/L (ref 96–106)
Creatinine, Ser: 1.03 mg/dL (ref 0.76–1.27)
GFR calc Af Amer: 95 mL/min/{1.73_m2} (ref >59)
GFR calc non Af Amer: 83 mL/min/{1.73_m2} (ref >59)
Globulin, Total: 2.8 g/dL (ref 1.5–4.5)
Glucose: 180 mg/dL — ABNORMAL HIGH (ref 65–99)
Potassium: 4.4 mmol/L (ref 3.5–5.2)
Sodium: 134 mmol/L (ref 134–144)
Total Protein: 7.4 g/dL (ref 6.0–8.5)

## 2020-04-15 LAB — LIPID PANEL
Chol/HDL Ratio: 4.2 ratio (ref 0.0–5.0)
Cholesterol, Total: 245 mg/dL — ABNORMAL HIGH (ref 100–199)
HDL: 58 mg/dL (ref >39)
LDL Chol Calc (NIH): 167 mg/dL — ABNORMAL HIGH (ref 0–99)
Triglycerides: 112 mg/dL (ref 0–149)
VLDL Cholesterol Cal: 20 mg/dL (ref 5–40)

## 2020-04-15 LAB — HEMOGLOBIN A1C
Est. average glucose Bld gHb Est-mCnc: 180 mg/dL
Hgb A1c MFr Bld: 7.9 % — ABNORMAL HIGH (ref 4.8–5.6)

## 2020-04-15 MED ORDER — HYDROCHLOROTHIAZIDE 25 MG PO TABS: 25.0000 mg | ORAL_TABLET | Freq: Every day | ORAL | 1 refills | Status: DC

## 2020-04-15 MED ORDER — METFORMIN HCL 500 MG PO TABS
500.0000 mg | ORAL_TABLET | Freq: Two times a day (BID) | ORAL | 1 refills | Status: DC
Start: 2020-04-15 — End: 2020-10-27

## 2020-04-15 MED ORDER — ATORVASTATIN CALCIUM 80 MG PO TABS: 80.0000 mg | ORAL_TABLET | Freq: Every day | ORAL | 1 refills | Status: DC

## 2020-04-15 NOTE — Progress Notes (Signed)
Patient notified of results & recommendations. Expressed understanding. Patient says that he has been compliant with all medications. Does not want to increase Metformin or Ozempic at this time. He says that he has been off of his diet due to stress but will start back eating right & exercising.

## 2020-04-17 DIAGNOSIS — M5136 Other intervertebral disc degeneration, lumbar region: Secondary | ICD-10-CM | POA: Insufficient documentation

## 2020-04-17 DIAGNOSIS — M5030 Other cervical disc degeneration, unspecified cervical region: Secondary | ICD-10-CM | POA: Insufficient documentation

## 2020-04-19 ENCOUNTER — Ambulatory Visit (INDEPENDENT_AMBULATORY_CARE_PROVIDER_SITE_OTHER): Payer: 59 | Admitting: Nurse Practitioner

## 2020-04-19 ENCOUNTER — Encounter: Payer: Self-pay | Admitting: Nurse Practitioner

## 2020-04-19 VITALS — BP 140/90 | HR 56 | Ht 73.0 in | Wt 262.1 lb

## 2020-04-19 DIAGNOSIS — K219 Gastro-esophageal reflux disease without esophagitis: Secondary | ICD-10-CM

## 2020-04-19 DIAGNOSIS — R194 Change in bowel habit: Secondary | ICD-10-CM

## 2020-04-19 MED ORDER — OMEPRAZOLE 40 MG PO CPDR: DELAYED_RELEASE_CAPSULE | ORAL | 3 refills | Status: DC

## 2020-04-19 NOTE — Progress Notes (Signed)
ASSESSMENT AND PLAN    # GERD with pyrosis and regurgitation, new --Has been taking over the counter medication as needed (Tagamet he thinks) which is about 2-3 times a week.  --Discontinue Tagamet. Start Omeprazole 40 mg 30 minutes before breakfast.  --Anti-reflux measures discussed.  --His weight is up a few pounds. Even mild weight loss may help GERD symptoms. Hopefully the recent dietary changes will help.  --Hopefully need for reflux medications will be short term.   # Bowel changes. Recently developed constipation / diarrhea / abdominal pain. Seen in ED earlier this month. Fiber recommended.  --Etiology of bowel changes unclear. Diarrhea resolved. He is now taking probiotics, a digestive enzyme and fiber gummies. Stools back to being soft again but volume is small.   --Continue daily fiber for now.  --Will see him back in a few weeks. Right now I am hesitant to start Miralax or anything else for possible mild constipation because at baseline patient has 2-3 soft BMs day on Metformin. I don't want to induce diarrhea or more frequent BMs. At times patient take full dose of Metformin due to increased number of BMs it causes him to have.    # [facility] of colon cancer ; personal history of adenomatous colon polyps --Due for surveilance colonoscopy Feb 2022  HISTORY OF PRESENT ILLNESS     Primary Gastroenterologist :Oretha Caprice, MD      Chief Complaint : constipation / diarrhea, abdominal cramps  Jose Bishop is a 54 y.o. male with PMH / [place] significant for,  but not necessarily limited to: DM2, hypertension, remote TIA, hyperlipidemia, history of prostate cancer, history of adenomatous colon polyps, family history colon cancer.   Patient is referred by PCP. The appointment notes state patient referred for blood in stool. Patient says he has NOT seen any blood in his stool and has not recently had a rectal exam to check for stool in blood. Patient says he is here for bowel changes.      Patient has taken Metformin for years. Because it causes him to have more frequent BMs he doesn't always take the prescribed amount. Generally he has 2-3 soft BMs a day. Several weeks ago he developed constipation alternating with non-bloody diarrhea. Bowel changes associated with abdominal pain and sweats / chills.  Patient thought he had food poisoning.   Patient was went to ED 03/29/2020 for evaluation of symptoms.   Probiotics and a fiber recommended. No labs or imaging done.  He did have a CMP on 04/15/2020 which was unremarkable except for glucose of 180. Patient feels like his BMs are getting back to baseline. Stools are soft again though smaller volume than usual.  He is taking one fiber gummy a day. Also taking probiotics and a digestive enzyme. Patient has recently started having pyrosis and regurgitation. He picked up an OTC medication ( thinks Tagament) and takes it as needed which is 2-3 times a day. He has recently made major dietary changes. He changed to almond milk. He is eating baked instead of fried foods.  He consumes very little, if any caffeine consumption.     Colonoscopy  Feb 2017   ENDOSCOPIC IMPRESSION: 1. Sessile polyp was found in the ascending colon; polypectomy was performed with cold forceps 2. Sessile polyp ranging between 3-89mm in size was found in the transverse colon; polypectomy was performed with a cold snare 3. The examination was otherwise normal Path = tubular adenomas  Past Medical History:  Diagnosis Date  .  Difficulty sleeping   . Essential hypertension   . Family history of stroke   . Headache   . History of transient ischemic attack (TIA)    15 YRS AGO  . Hyperlipidemia   . Impotence of organic origin   . Memory loss   . Osteoarthritis of acromioclavicular joint   . Prostate cancer ([place])   . Type 2 diabetes mellitus with hyperglycemia [facility])      Past Surgical History:  Procedure Laterality Date  . HERNIA REPAIR     X2 ( 1 ING HERNIA /  1 UMBILICAL HERNIA )  . LYMPHADENECTOMY Bilateral 01/04/2015   Procedure: BILATERAL LYMPHADENECTOMY;  Surgeon: Raynelle Bring, MD;  Location: [facility];  Service: Urology;  Laterality: Bilateral;  . MASS EXCISION     L UPPER ARM  . PENILE PROSTHESIS IMPLANT  03/18/2019  . ROBOT ASSISTED LAPAROSCOPIC RADICAL PROSTATECTOMY N/A 01/04/2015   Procedure: ROBOTIC ASSISTED LAPAROSCOPIC RADICAL PROSTATECTOMY LEVEL 2;  Surgeon: Raynelle Bring, MD;  Location: [facility];  Service: Urology;  Laterality: N/A;  . ROTATOR CUFF REPAIR Bilateral   . SURGERY SCROTAL / TESTICULAR     Family History  Problem Relation Age of Onset  . Anuerysm Mother   . Stroke Mother   . Hypertension Father   . Peripheral vascular disease Father   . Stroke Father   . Diabetes Sister   . Heart murmur Sister    Social History   Tobacco Use  . Smoking status: Never Smoker  . Smokeless tobacco: Never Used  Vaping Use  . Vaping Use: Never used  Substance Use Topics  . Alcohol use: Yes    Alcohol/week: 0.0 standard drinks    Comment: OCCASIONAL  . Drug use: No   Current Outpatient Medications  Medication Sig Dispense Refill  . atorvastatin (LIPITOR) 80 MG tablet Take 1 tablet (80 mg total) by mouth daily. 90 tablet 1  . DULoxetine (CYMBALTA) 30 MG capsule Take 1 tab PO once daily for one week then take 2 tabs PO once daily 60 capsule 1  . ezetimibe (ZETIA) 10 MG tablet Take 1 tablet (10 mg total) by mouth daily. 90 tablet 3  . hydrochlorothiazide (HYDRODIURIL) 25 MG tablet Take 1 tablet (25 mg total) by mouth daily. 90 tablet 1  . metFORMIN (GLUCOPHAGE) 500 MG tablet Take 1 tablet (500 mg total) by mouth 2 (two) times daily with a meal. 180 tablet 1  . OZEMPIC, 0.25 OR 0.5 MG/DOSE, 2 MG/1.5ML SOPN Inject 0.5 mg into the skin once a week. 4 pen 2  . traZODone (DESYREL) 50 MG tablet Take 0.5-1 tablets (25-50 mg total) by mouth at bedtime as needed for sleep. 30 tablet 3   No current facility-administered medications for this  visit.   Allergies  Allergen Reactions  . Bee Venom Anaphylaxis  . Dapagliflozin-Metformin Hcl Er      Review of Systems:  All other systems reviewed and negative except where noted in HPI.   PHYSICAL EXAM :    Wt Readings from Last 3 Encounters:  03/25/20 255 lb (115.7 kg)  01/06/20 256 lb (116.1 kg)  09/16/19 250 lb 12.8 oz (113.8 kg)    BP 140/90 (BP Location: Left Arm, Patient Position: Sitting, Cuff Size: Normal)   Pulse (!) 56   Ht 6\' 1"  (1.854 m) Comment: height measured without shoes  Wt 262 lb 2 oz (118.9 kg)   BMI 34.58 kg/m  Constitutional:  Pleasant male in no acute distress. Psychiatric: Normal mood and affect.  Behavior is normal. EENT: Pupils normal.  Conjunctivae are normal. No scleral icterus. Neck supple.  Cardiovascular: Normal rate, regular rhythm. No edema Pulmonary/chest: Effort normal and breath sounds normal. No wheezing, rales or rhonchi. Abdominal: Soft, nondistended, nontender. Bowel sounds active throughout. There are no masses palpable. No hepatomegaly. Neurological: Alert and oriented to person place and time. Skin: Skin is warm and dry. No rashes noted.  Tye Savoy, NP  04/19/2020, 8:22 AM  Cc:   Caryl Never*

## 2020-04-19 NOTE — Progress Notes (Signed)
I agree with the above note, plan 

## 2020-04-19 NOTE — Patient Instructions (Addendum)
If you are age 53 or older, your body mass index should be between 23-30. Your Body mass index is 34.58 kg/m. If this is out of the aforementioned range listed, please consider follow up with your Primary Care Provider.  If you are age 19 or younger, your body mass index should be between 19-25. Your Body mass index is 34.58 kg/m. If this is out of the aformentioned range listed, please consider follow up with your Primary Care Provider.   START Omeprazole 40 mg 1 capsule 30 minutes before breakfast.  You have been scheduled to follow up with Tye Savoy, NP on May 10, 2020 at 8:30 am.

## 2020-04-22 ENCOUNTER — Ambulatory Visit: Payer: 59

## 2020-05-03 ENCOUNTER — Ambulatory Visit (INDEPENDENT_AMBULATORY_CARE_PROVIDER_SITE_OTHER): Payer: 59 | Admitting: Licensed Clinical Social Worker

## 2020-05-03 ENCOUNTER — Other Ambulatory Visit: Payer: Self-pay

## 2020-05-03 ENCOUNTER — Ambulatory Visit
Admission: RE | Admit: 2020-05-03 | Discharge: 2020-05-03 | Disposition: A | Discharge status: Home / Self Care | Payer: 59 | Source: Ambulatory Visit | Attending: Physical Medicine and Rehabilitation | Admitting: Physical Medicine and Rehabilitation

## 2020-05-03 DIAGNOSIS — M545 Low back pain, unspecified: Secondary | ICD-10-CM

## 2020-05-03 DIAGNOSIS — F331 Major depressive disorder, recurrent, moderate: Secondary | ICD-10-CM

## 2020-05-03 MED ORDER — IOPAMIDOL (ISOVUE-300) INJECTION 61%
100.0000 mL | Freq: Once | INTRAVENOUS | Status: AC | PRN
  Administered 2020-05-03: 100 mL via INTRAVENOUS

## 2020-05-04 ENCOUNTER — Ambulatory Visit (HOSPITAL_COMMUNITY): Payer: Self-pay | Admitting: Physician Assistant

## 2020-05-06 ENCOUNTER — Telehealth: Payer: Self-pay

## 2020-05-06 ENCOUNTER — Other Ambulatory Visit: Payer: Self-pay

## 2020-05-06 ENCOUNTER — Encounter: Payer: Self-pay | Admitting: Emergency Medicine

## 2020-05-06 ENCOUNTER — Ambulatory Visit
Admission: EM | Admit: 2020-05-06 | Discharge: 2020-05-06 | Disposition: A | Discharge status: Home / Self Care | Payer: 59 | Attending: Emergency Medicine | Admitting: Emergency Medicine

## 2020-05-06 DIAGNOSIS — E0865 Diabetes mellitus due to underlying condition with hyperglycemia: Secondary | ICD-10-CM | POA: Diagnosis not present

## 2020-05-06 LAB — POCT URINALYSIS DIP (MANUAL ENTRY)
Bilirubin, UA: NEGATIVE
Glucose, UA: 500 mg/dL — AB
Leukocytes, UA: NEGATIVE
Nitrite, UA: NEGATIVE
Protein Ur, POC: NEGATIVE mg/dL
Spec Grav, UA: 1.02 (ref 1.010–1.025)
Urobilinogen, UA: 0.2 E.U./dL
pH, UA: 6 (ref 5.0–8.0)

## 2020-05-06 LAB — POCT FASTING CBG KUC MANUAL ENTRY
POCT Glucose (KUC): 284 mg/dL — AB (ref 70–99)
POCT Glucose (KUC): 300 mg/dL — AB (ref 70–99)

## 2020-05-06 MED ORDER — ONDANSETRON 4 MG PO TBDP: 4.0000 mg | ORAL_TABLET | Freq: Three times a day (TID) | ORAL | 0 refills | Status: DC | PRN

## 2020-05-06 MED ORDER — INSULIN ASPART 100 UNIT/ML ~~LOC~~ SOLN
5.0000 [IU] | Freq: Once | SUBCUTANEOUS | Status: AC
  Administered 2020-05-06: 5 [IU] via SUBCUTANEOUS

## 2020-05-06 NOTE — Telephone Encounter (Signed)
Pt called the office had injection and sugar is 378

## 2020-05-06 NOTE — ED Provider Notes (Signed)
[facility]    CSN: [id number] Arrival date & time: 05/06/20  1548      History   Chief Complaint Chief Complaint  Patient presents with  . Hyperglycemia    HPI Jose Bishop is a 54 y.o. male  With extensive history as below presenting for elevated sugar readings at home.  Patient states that he had multiple steroid injections in his lumbar and thoracic spine: Tolerated procedure well, though later this afternoon noted sugar readings of 378.  States that he felt some abdominal discomfort and nausea.  No vomiting, chest pain, shortness of breath.  Feels like he is been urinating more as well without penile discharge, dysuria.  Took 0.3 of Ozempic, half of a Metformin tab with mild relief.  Past Medical History:  Diagnosis Date  . AAA (abdominal aortic aneurysm) ([place])   . Bradycardia   . Difficulty sleeping   . Essential hypertension   . Family history of stroke   . Headache   . History of transient ischemic attack (TIA)    15 YRS AGO  . Hyperlipidemia   . Impotence of organic origin   . Liver cyst   . Memory loss   . Osteoarthritis of acromioclavicular joint   . Prostate cancer ([place])   . Type 2 diabetes mellitus with hyperglycemia [facility])     Patient Active Problem List   Diagnosis Date Noted  . Type 2 diabetes mellitus without complication, with long-term current use of insulin ([place]) 10/06/2019  . Benign essential HTN 10/06/2019  . Thoracic aortic aneurysm ([place]) 09/16/2019  . ED (erectile dysfunction) of organic origin 01/06/2019  . History of repair of rotator cuff 04/16/2018  . Hyperlipidemia 04/18/2017  . Atypical chest pain 01/24/2017  . Prostate cancer ([place]) 01/04/2015  . History of recurrent TIAs 10/02/2014  . Family history of cerebral aneurysm 10/02/2014    Past Surgical History:  Procedure Laterality Date  . HERNIA REPAIR     X2 ( 1 ING HERNIA / 1 UMBILICAL HERNIA )  . LYMPHADENECTOMY Bilateral 01/04/2015   Procedure: BILATERAL  LYMPHADENECTOMY;  Surgeon: Raynelle Bring, MD;  Location: [facility];  Service: Urology;  Laterality: Bilateral;  . MASS EXCISION     L UPPER ARM  . PENILE PROSTHESIS IMPLANT  03/18/2019  . ROBOT ASSISTED LAPAROSCOPIC RADICAL PROSTATECTOMY N/A 01/04/2015   Procedure: ROBOTIC ASSISTED LAPAROSCOPIC RADICAL PROSTATECTOMY LEVEL 2;  Surgeon: Raynelle Bring, MD;  Location: [facility];  Service: Urology;  Laterality: N/A;  . ROTATOR CUFF REPAIR Bilateral   . SURGERY SCROTAL / TESTICULAR         Home Medications    Prior to Admission medications   Medication Sig Start Date End Date Taking? Authorizing Provider  atorvastatin (LIPITOR) 80 MG tablet Take 1 tablet (80 mg total) by mouth daily. 04/15/20   Nicolette Bang, DO  DULoxetine (CYMBALTA) 30 MG capsule Take 1 tab PO once daily for one week then take 2 tabs PO once daily 03/25/20   Mayers, Cari S, PA-C  ezetimibe (ZETIA) 10 MG tablet Take 1 tablet (10 mg total) by mouth daily. 01/08/20   Nicolette Bang, DO  hydrochlorothiazide (HYDRODIURIL) 25 MG tablet Take 1 tablet (25 mg total) by mouth daily. 04/15/20   Nicolette Bang, DO  metFORMIN (GLUCOPHAGE) 500 MG tablet Take 1 tablet (500 mg total) by mouth 2 (two) times daily with a meal. 04/15/20   Nicolette Bang, DO  omeprazole (PRILOSEC) 40 MG capsule Take 1 capsule 30 minutes  before breakfast 04/19/20   Willia Craze, NP  ondansetron (ZOFRAN ODT) 4 MG disintegrating tablet Take 1 tablet (4 mg total) by mouth every 8 (eight) hours as needed for nausea or vomiting. 05/06/20   Hall-Potvin, [place], PA-C  OZEMPIC, 0.25 OR 0.5 MG/DOSE, 2 MG/1.5ML SOPN Inject 0.5 mg into the skin once a week. 10/17/19   Nicolette Bang, DO  traZODone (DESYREL) 50 MG tablet Take 0.5-1 tablets (25-50 mg total) by mouth at bedtime as needed for sleep. 03/25/20   Mayers, Loraine Grip, PA-C    Family History Family History  Problem Relation Age of Onset  . Anuerysm Mother   . Stroke  Mother   . Hypertension Father   . Peripheral vascular disease Father   . Stroke Father   . Colon cancer Father        68's  . Diabetes Sister   . Heart murmur Sister   . Heart murmur Son     Social History Social History   Tobacco Use  . Smoking status: Never Smoker  . Smokeless tobacco: Never Used  Vaping Use  . Vaping Use: Never used  Substance Use Topics  . Alcohol use: Yes    Alcohol/week: 0.0 standard drinks    Comment: OCCASIONAL  . Drug use: No     Allergies   Bee venom and Dapagliflozin-metformin hcl er   Review of Systems Review of Systems  Constitutional: Negative for fatigue and fever.  Respiratory: Negative for cough and shortness of breath.   Cardiovascular: Negative for chest pain and palpitations.  Gastrointestinal: Positive for abdominal pain and nausea. Negative for diarrhea and vomiting.  Genitourinary: Positive for frequency. Negative for discharge, dysuria, genital sores, hematuria and urgency.  Musculoskeletal: Negative for arthralgias and myalgias.  Skin: Negative for rash and wound.  Neurological: Negative for speech difficulty and headaches.  All other systems reviewed and are negative.    Physical Exam Triage Vital Signs ED Triage Vitals  Enc Vitals Group     BP      Pulse      Resp      Temp      Temp src      SpO2      Weight      Height      Head Circumference      Peak Flow      Pain Score      Pain Loc      Pain Edu?      Excl. in [place]?    No data found.  Updated Vital Signs BP (!) 147/99 (BP Location: Left Arm)   Pulse (!) 104   Temp 98.8 F (37.1 C) (Oral)   Resp 18   SpO2 95%   Visual Acuity Right Eye Distance:   Left Eye Distance:   Bilateral Distance:    Right Eye Near:   Left Eye Near:    Bilateral Near:     Physical Exam Constitutional:      General: He is not in acute distress.    Appearance: He is obese. He is not ill-appearing.  HENT:     Head: Normocephalic and atraumatic.  Eyes:      General: No scleral icterus.    Pupils: Pupils are equal, round, and reactive to light.  Cardiovascular:     Rate and Rhythm: Normal rate and regular rhythm.  Pulmonary:     Effort: Pulmonary effort is normal. No respiratory distress.     Breath sounds: No wheezing  or rales.  Abdominal:     Palpations: Abdomen is soft.     Tenderness: There is no abdominal tenderness.  Skin:    Coloration: Skin is not jaundiced or pale.  Neurological:     Mental Status: He is alert and oriented to person, place, and time.      UC Treatments / Results  Labs (all labs ordered are listed, but only abnormal results are displayed) Labs Reviewed  POCT URINALYSIS DIP (MANUAL ENTRY) - Abnormal; Notable for the following components:      Result Value   Glucose, UA =500 (*)    Ketones, POC UA small (15) (*)    Blood, UA trace-intact (*)    All other components within normal limits  POCT FASTING CBG [facility] MANUAL ENTRY - Abnormal; Notable for the following components:   POCT Glucose ([facility]) 300 (*)    All other components within normal limits  POCT FASTING CBG [facility] MANUAL ENTRY - Abnormal; Notable for the following components:   POCT Glucose ([facility]) 284 (*)    All other components within normal limits    EKG   Radiology No results found.  Procedures Procedures (including critical care time)  Medications Ordered in UC Medications  insulin aspart (novoLOG) injection 5 Units (5 Units Subcutaneous Given 05/06/20 1703)    Initial Impression / Assessment and Plan / UC Course  I have reviewed the triage vital signs and the nursing notes.  Pertinent labs & imaging results that were available during my care of the patient were reviewed by me and considered in my medical decision making (see chart for details).     Patient afebrile, nontoxic in office today.  CBG 300.  Urine dip with glucose, ketones, trace intact blood.  Given 5 units of NovoLog in office which he tolerated well.  Repeat CBG 284, pt  feeling better.  Hyperglycemia likely adverse effect of steroid in setting of type 2 diabetes.  We will continue home medications, monitoring sugars, modify diet, follow-up with PCP.  ER return precautions discussed, pt verbalized understanding and is agreeable to plan. Final Clinical Impressions(s) / UC Diagnoses   Final diagnoses:  Diabetes mellitus due to underlying condition with hyperglycemia, without long-term current use of insulin [place])   Discharge Instructions   None    ED Prescriptions    Medication Sig Dispense Auth. Provider   ondansetron (ZOFRAN ODT) 4 MG disintegrating tablet Take 1 tablet (4 mg total) by mouth every 8 (eight) hours as needed for nausea or vomiting. 21 tablet Hall-Potvin, [place], PA-C     PDMP not reviewed this encounter.   Hall-Potvin, [place], [place] 05/06/20 1749

## 2020-05-06 NOTE — ED Triage Notes (Signed)
Pt sts had first steroid injection into back today; pt sts took normal meds and checked CBG was elevated at 318; pt sts also increased urination today

## 2020-05-06 NOTE — Telephone Encounter (Signed)
FYI- Pt reported glucose readings in the high 300s since receiving steroid injections in back at [facility] this morning, last reading was down to 326 from 378, taken about 15 mins ago, reports took Ozempic and 1/2 tab of metformin with food (chicken, egg & cheese sandwich) about 2 hrs ago, also reports wooziness and chest pressure (when lying down), advised pt to go to urgent care for evaluation and treatment as mobile unit unavailable at this time, may need fast/short-acting treatment, pt verbalized understanding and will go to UC.

## 2020-05-10 ENCOUNTER — Ambulatory Visit: Payer: 59 | Admitting: Nurse Practitioner
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
## 2020-05-13 ENCOUNTER — Ambulatory Visit (HOSPITAL_COMMUNITY): Payer: Self-pay | Admitting: Physician Assistant

## 2020-05-18 ENCOUNTER — Other Ambulatory Visit: Payer: Self-pay

## 2020-05-18 ENCOUNTER — Other Ambulatory Visit: Payer: 59

## 2020-05-18 DIAGNOSIS — Z20822 Contact with and (suspected) exposure to covid-19: Secondary | ICD-10-CM

## 2020-05-20 LAB — NOVEL CORONAVIRUS, NAA: SARS-CoV-2, NAA: NOT DETECTED

## 2020-05-20 LAB — SARS-COV-2, NAA 2 DAY TAT

## 2020-06-23 ENCOUNTER — Other Ambulatory Visit: Payer: Self-pay

## 2020-06-23 ENCOUNTER — Ambulatory Visit (INDEPENDENT_AMBULATORY_CARE_PROVIDER_SITE_OTHER): Payer: 59 | Admitting: Licensed Clinical Social Worker

## 2020-06-23 DIAGNOSIS — F331 Major depressive disorder, recurrent, moderate: Secondary | ICD-10-CM
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
## 2020-07-07 ENCOUNTER — Ambulatory Visit (INDEPENDENT_AMBULATORY_CARE_PROVIDER_SITE_OTHER): Payer: 59 | Admitting: Licensed Clinical Social Worker

## 2020-07-07 ENCOUNTER — Other Ambulatory Visit: Payer: Self-pay

## 2020-07-07 DIAGNOSIS — F331 Major depressive disorder, recurrent, moderate: Secondary | ICD-10-CM

## 2020-07-08 NOTE — Progress Notes (Signed)
   THERAPIST PROGRESS NOTE  Session Time: 55 min  Participation Level: Active  Behavioral Response: CasualAlertDepressed  Type of Therapy: Individual Therapy  Treatment Goals addressed: Communication: Depression/Coping  Interventions: Motivational Interviewing, Supportive and Other: Grief/loss  Summary: Jose Bishop is a 55 y.o. male who presents with hx of MDD. Pt comes for in person session per his preference today. Pt reports "I started school". LCSW assessed for details. Pt states he got a scholarship via [employer] to go to truck driving school at [employer]. Pt states classes started this past Mon. Pt in class from 8a-5:30p and states this is an 8 wk program to get "class A license". He reports they bring in recruiters from different companies at [employer] to assist with job placement. Pt states his wife is pleased pt having this opportunity and states they are getting along fairly well. Pt verbalizes he is pleased with school development when asked but remains mostly expressionless with low, flat voice tone. He feels this will give him a back up plan if he does not get disability approved. Pt worried he will have no income during this school time unless he can find something he can do that will fit with school schedule. Pt states his own truck is still in shop. He is disappointed with loss of own business, financial strain. Continues to struggle with physical pain and limitations from work injury. Pt is displeased with social security for not allowing him to have the money he paid in when he feels he needs/deserves it. Pt states how hard it is to not be able to be a provider for his family like he used to be. He reflects on successful times with good income and a sound healthy body. LCSW provided active listening and addressed compounded losses. Assessed for pt's knowledge of grief process/stages. Pt denies knowledge of grief. LCSW provided education on grief/loss and impacts of unresolved  losses/anger. Pt receptive. LCSW introduced concept of letter writing to deal with frustration/losses which pt will consider after he completes school saying he has a lot of homework so far. Pt states he did try [facility] and is using smooth jazz for calming, positive distraction. LCSW reviewed poc including scheduling prior to close of session. Pt states appreciation for care.      Suicidal/Homicidal: Nowithout intent/plan  Therapist Response: Pt remains receptive to care. (Meds, Self guided imagery, sister's release 01/27)  Plan: Return again in ~4 weeks.  Diagnosis: Axis I: MDD, recurrent, moderate    Axis II: Deferred  Hermine Messick, LCSW 07/08/2020

## 2020-08-18 ENCOUNTER — Ambulatory Visit (HOSPITAL_COMMUNITY): Payer: 59 | Admitting: Licensed Clinical Social Worker

## 2020-08-31 ENCOUNTER — Telehealth: Payer: Self-pay | Admitting: Internal Medicine

## 2020-08-31 NOTE — Telephone Encounter (Signed)
Pt request REFERRAL to Eye specialist for increased pressure in eyes. Dr. Wonda Horner is not in network with [facility].

## 2020-09-01 ENCOUNTER — Other Ambulatory Visit: Payer: Self-pay | Admitting: Internal Medicine

## 2020-09-01 DIAGNOSIS — H269 Unspecified cataract: Secondary | ICD-10-CM

## 2020-09-01 NOTE — Telephone Encounter (Signed)
Can we confirm does he have a h/o glaucoma or does he just have a sensation of increased eye pressure?   Phill Myron, D.O. Primary Care at [facility]  09/01/2020, 9:35 AM

## 2020-09-01 NOTE — Telephone Encounter (Signed)
Referral has been placed.   Phill Myron, D.O. Primary Care at [facility]  09/01/2020, 3:50 PM

## 2020-09-01 NOTE — Telephone Encounter (Signed)
Called pt he states took a test at eye doctor for yearly exam. Pt states was told cateract and eye pressure twice tthe normal range.  Night driving difficulty.   Melanee Spry eye doctor.  Pt informed to get office visit record sent here.

## 2020-09-06 ENCOUNTER — Ambulatory Visit (HOSPITAL_COMMUNITY): Payer: 59 | Admitting: Licensed Clinical Social Worker

## 2020-09-06 ENCOUNTER — Other Ambulatory Visit: Payer: Self-pay

## 2020-09-27 DIAGNOSIS — M545 Low back pain, unspecified: Secondary | ICD-10-CM | POA: Insufficient documentation

## 2020-09-27 DIAGNOSIS — M542 Cervicalgia: Secondary | ICD-10-CM | POA: Insufficient documentation
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
## 2020-10-19 ENCOUNTER — Encounter: Payer: Self-pay | Admitting: Family

## 2020-10-19 ENCOUNTER — Ambulatory Visit (INDEPENDENT_AMBULATORY_CARE_PROVIDER_SITE_OTHER): Payer: 59 | Admitting: Family

## 2020-10-19 ENCOUNTER — Other Ambulatory Visit: Payer: Self-pay

## 2020-10-19 VITALS — BP 149/93 | HR 61 | Ht 72.99 in | Wt 261.2 lb

## 2020-10-19 DIAGNOSIS — R9431 Abnormal electrocardiogram [ECG] [EKG]: Secondary | ICD-10-CM

## 2020-10-19 DIAGNOSIS — Z01818 Encounter for other preprocedural examination: Secondary | ICD-10-CM

## 2020-10-19 NOTE — Patient Instructions (Signed)
Electrocardiogram An electrocardiogram (ECG or EKG) is a test to check your heart. The test is simple and safe, and it does not hurt. It may be done:  As a part of a physical exam.  To check out symptoms like chest pain or a fast or uneven heartbeat (palpitations).  To see how certain heart treatments are working. Tell a health care provider about:  Any allergies you have.  All medicines you are taking. These include vitamins, herbs, eye drops, creams, and over-the-counter medicines. What are the risks? There are no risks. What happens before the test?  There is nothing you need to do to prepare.  Do not exercise or drink cold water right before the test. These can affect the results. What happens during the test?  You will take off your clothes from the waist up.  You will lie on your back.  Hair may be removed from your chest, arms, and legs.  Sticky patches (electrodes) will be placed on your chest, arms, and legs.  Wires (leads) will be attached to the sticky patches and to a machine.  You will be asked to relax and lie still while the machine checks your heart. The test may vary among doctors and hospitals.   What can I expect after the test?  Your test results will be looked at by a doctor.  It is up to you to get your test results. Ask how to get your results when they are ready. Summary  An electrocardiogram (ECG or EKG) is a test to check your heart.  The test is simple and safe, and it does not hurt. There are no risks of having this test. You do not need to prepare for the test.  Sticky patches (electrodes) will be placed on your chest, arms, and legs. Wires (leads) will be attached to the sticky patches and to a machine.  While you lie still on your back, the machine will check your heart. This information is not intended to replace advice given to you by your health care provider. Make sure you discuss any questions you have with your health care  provider. Document Revised: 02/03/2020 Document Reviewed: 02/03/2020 Elsevier Patient Education  2021 [employer].

## 2020-10-19 NOTE — Progress Notes (Signed)
Pt here for [facility] clearance w/[facility] per Clyde Canterbury pt reported that he had abnormal EKG before and need EKg completed before exam can be done

## 2020-10-21 LAB — HM DIABETES EYE EXAM

## 2020-10-22 ENCOUNTER — Telehealth: Payer: Self-pay | Admitting: Internal Medicine

## 2020-10-22 ENCOUNTER — Other Ambulatory Visit: Payer: Self-pay

## 2020-10-22 ENCOUNTER — Encounter: Payer: Self-pay | Admitting: Cardiovascular Disease

## 2020-10-22 ENCOUNTER — Ambulatory Visit (INDEPENDENT_AMBULATORY_CARE_PROVIDER_SITE_OTHER): Payer: 59 | Admitting: Cardiovascular Disease

## 2020-10-22 DIAGNOSIS — E782 Mixed hyperlipidemia: Secondary | ICD-10-CM | POA: Diagnosis not present

## 2020-10-22 DIAGNOSIS — I712 Thoracic aortic aneurysm, without rupture, unspecified: Secondary | ICD-10-CM

## 2020-10-22 DIAGNOSIS — I10 Essential (primary) hypertension: Secondary | ICD-10-CM | POA: Diagnosis not present

## 2020-10-22 DIAGNOSIS — R0789 Other chest pain: Secondary | ICD-10-CM

## 2020-10-22 LAB — BASIC METABOLIC PANEL
BUN/Creatinine Ratio: 12 (ref 9–20)
BUN: 15 mg/dL (ref 6–24)
CO2: 25 mmol/L (ref 20–29)
Calcium: 9.9 mg/dL (ref 8.7–10.2)
Chloride: 98 mmol/L (ref 96–106)
Creatinine, Ser: 1.24 mg/dL (ref 0.76–1.27)
Glucose: 262 mg/dL — ABNORMAL HIGH (ref 65–99)
Potassium: 4.8 mmol/L (ref 3.5–5.2)
Sodium: 138 mmol/L (ref 134–144)
eGFR: 69 mL/min/{1.73_m2} (ref >59)

## 2020-10-22 LAB — LIPID PANEL
Chol/HDL Ratio: 5.3 ratio — ABNORMAL HIGH (ref 0.0–5.0)
Cholesterol, Total: 289 mg/dL — ABNORMAL HIGH (ref 100–199)
HDL: 55 mg/dL (ref >39)
LDL Chol Calc (NIH): 208 mg/dL — ABNORMAL HIGH (ref 0–99)
Triglycerides: 142 mg/dL (ref 0–149)
VLDL Cholesterol Cal: 26 mg/dL (ref 5–40)

## 2020-10-22 LAB — HEPATIC FUNCTION PANEL
ALT: 24 IU/L (ref 0–44)
AST: 17 IU/L (ref 0–40)
Albumin: 4.7 g/dL (ref 3.8–4.9)
Alkaline Phosphatase: 67 IU/L (ref 44–121)
Bilirubin Total: 0.5 mg/dL (ref 0.0–1.2)
Bilirubin, Direct: 0.14 mg/dL (ref 0.00–0.40)
Total Protein: 7.7 g/dL (ref 6.0–8.5)

## 2020-10-22 MED ORDER — HYDROCHLOROTHIAZIDE 25 MG PO TABS: 25.0000 mg | ORAL_TABLET | Freq: Every day | ORAL | 3 refills | Status: DC

## 2020-10-22 MED ORDER — ATORVASTATIN CALCIUM 80 MG PO TABS: 80.0000 mg | ORAL_TABLET | Freq: Every day | ORAL | 3 refills | Status: DC

## 2020-10-22 MED ORDER — EZETIMIBE 10 MG PO TABS: 10.0000 mg | ORAL_TABLET | Freq: Every day | ORAL | 3 refills | Status: DC

## 2020-10-22 NOTE — Assessment & Plan Note (Signed)
History of thoracic aortic aneurysm measuring 42 mm by CTA 10/02/2019.  This will be repeated.

## 2020-10-22 NOTE — Telephone Encounter (Signed)
As discussed with patient during office visit 10/19/2020 Cardiology will evaluate if medical clearance is appropriate.

## 2020-10-22 NOTE — Assessment & Plan Note (Signed)
History of essential hypertension blood pressure measured today at 160/82.  He does admit to dietary indiscretion.  He is on hydrochlorothiazide.

## 2020-10-22 NOTE — Assessment & Plan Note (Signed)
History of atypical chest pain with negative stress echo performed at [facility] 12/30/2018.

## 2020-10-22 NOTE — Assessment & Plan Note (Signed)
History of hyperlipidemia on statin therapy although he ran out of his statin meds a month ago.  His most recent lipid profile performed 04/14/2020 revealed total cholesterol of 245, LDL 167 and HDL 58.  If indeed he was on a statin at that time we would need to consider alternative medication such as PCSK9.  I will recheck a lipid liver profile this morning and will refill his lipid-lowering medications.

## 2020-10-22 NOTE — Telephone Encounter (Signed)
Pt called upset that his clearance was not completed today 4.29.2022 by Phill Myron DO. Pt stated he went to the cardiologist appointment this morning and that they told him that the providers here can see the visit info. I again spoke with Phill Myron DO and she stated she did not feel comfortable clearing pt because the cardiologist did not state the patient was cleared.

## 2020-10-22 NOTE — Patient Instructions (Signed)
Medication Instructions:  Your physician recommends that you continue on your current medications as directed. Please refer to the Current Medication list given to you today.  *If you need a refill on your cardiac medications before your next appointment, please call your pharmacy*   Lab Work: Your physician recommends that you have labs drawn today: BMET, lipid/liver profile  If you have labs (blood work) drawn today and your tests are completely normal, you will receive your results only by: Marland Kitchen MyChart Message (if you have MyChart) OR . A paper copy in the mail If you have any lab test that is abnormal or we need to change your treatment, we will call you to review the results.   Testing/Procedures: Non-Cardiac CT Angiography (CTA), is a special type of CT scan that uses a computer to produce multi-dimensional views of major blood vessels throughout the body. In CT angiography, a contrast material is injected through an IV to help visualize the blood vessels. This procedure is done at [address] 3rd Floor.   Follow-Up: At [facility], you and your health needs are our priority.  As part of our continuing mission to provide you with exceptional heart care, we have created designated Provider Care Teams.  These Care Teams include your primary Cardiologist (physician) and Advanced Practice Providers (APPs -  Physician Assistants and Nurse Practitioners) who all work together to provide you with the care you need, when you need it.  We recommend signing up for the patient portal called "MyChart".  Sign up information is provided on this After Visit Summary.  MyChart is used to connect with patients for Virtual Visits (Telemedicine).  Patients are able to view lab/test results, encounter notes, upcoming appointments, etc.  Non-urgent messages can be sent to your provider as well.   To learn more about what you can do with MyChart, go to [email or web address].    Your next  appointment:   12 month(s)  The format for your next appointment:   In Person  Provider:   Quay Burow, MD   Other Instructions Please keep a 30 day blood pressure log and return in 4 weeks to see a PharmD to review.

## 2020-10-22 NOTE — Telephone Encounter (Signed)
Amelia from [place] called asking for [facility] Clearance form to be faxed  Back [phone] so they can schedule the patient. Thank you!

## 2020-10-22 NOTE — Progress Notes (Signed)
10/22/2020 FILIP[facility] PULS   26-Apr-1966  [id number]  Primary Physician Nicolette Bang, DO Primary Cardiologist: Lorretta Harp MD Lupe Carney, [place]  HPI:  Jose Bishop is a 55 y.o.  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  He self-referred for evaluation of new onset chest pain .I last saw him in the office 01/24/2017.  He has had 3 episodes that lasted days a time with radiation to his neck and left upper extremity. He really has no risk factors other than recently diagnosed type 2 diabetes.  I performed a Myoview stress test on him 02/02/2017 that showed apical ischemia subsequent coronary CTA 05/11/2017 showed essentially a coronary calcium score of 1 with no evidence of CAD.  He did however have a 42-minute millimeter ascending thoracic aorta.  He was admitted to [facility] apparently last year for "bradycardia" and PVCs.  He apparently wore a Zio patch at that time although the results are unavailable to me.  He had stress echo performed at [facility] 12/30/2018 which was entirely normal.  Since I saw him a year ago he has remained stable.  He denies chest pain or shortness of breath.  He does state that he has arthritis in his neck and spine for which he is getting physical therapy.  Current Meds  Medication Sig  . atorvastatin (LIPITOR) 80 MG tablet Take 1 tablet (80 mg total) by mouth daily.  . DULoxetine (CYMBALTA) 30 MG capsule Take 1 tab PO once daily for one week then take 2 tabs PO once daily  . ezetimibe (ZETIA) 10 MG tablet Take 1 tablet (10 mg total) by mouth daily.  . hydrochlorothiazide (HYDRODIURIL) 25 MG tablet Take 1 tablet (25 mg total) by mouth daily.  . metFORMIN (GLUCOPHAGE) 500 MG tablet Take 1 tablet (500 mg total) by  mouth 2 (two) times daily with a meal.  . omeprazole (PRILOSEC) 40 MG capsule Take 1 capsule 30 minutes before breakfast  . ondansetron (ZOFRAN ODT) 4 MG disintegrating tablet Take 1 tablet (4 mg total) by mouth every 8 (eight) hours as needed for nausea or vomiting.  Marland Kitchen OZEMPIC, 0.25 OR 0.5 MG/DOSE, 2 MG/1.5ML SOPN Inject 0.5 mg into the skin once a week.  . traZODone (DESYREL) 50 MG tablet Take 0.5-1 tablets (25-50 mg total) by mouth at bedtime as needed for sleep.     Allergies  Allergen Reactions  . Bee Venom Anaphylaxis  . Dapagliflozin-Metformin Hcl Er     Social History   Socioeconomic History  . Marital status: Married    Spouse name: Not on file  . Number of children: 5  . Years of education: Not on file  . Highest education level: Not on file  Occupational History  . Occupation: part time  Tobacco Use  . Smoking status: Never Smoker  . Smokeless tobacco: Never Used  Vaping Use  . Vaping Use: Never used  Substance and Sexual Activity  . Alcohol use: Yes    Alcohol/week: 0.0 standard drinks    Comment: OCCASIONAL  . Drug use: No  . Sexual activity: Yes    Partners: Female  Other Topics Concern  . Not on file  Social History Narrative  . Not on file   Social Determinants of Health   Financial Resource  Strain: Not on file  Food Insecurity: Not on file  Transportation Needs: Not on file  Physical Activity: Not on file  Stress: Not on file  Social Connections: Not on file  Intimate Partner Violence: Not on file     Review of Systems: General: negative for chills, fever, night sweats or weight changes.  Cardiovascular: negative for chest pain, dyspnea on exertion, edema, orthopnea, palpitations, paroxysmal nocturnal dyspnea or shortness of breath Dermatological: negative for rash Respiratory: negative for cough or wheezing Urologic: negative for hematuria Abdominal: negative for nausea, vomiting, diarrhea, bright red blood per rectum, melena, or  hematemesis Neurologic: negative for visual changes, syncope, or dizziness All other systems reviewed and are otherwise negative except as noted above.    Blood pressure (!) 160/82, pulse (!) 54, height 6\' 2"  (1.88 m), weight 266 lb 6.4 oz (120.8 kg), SpO2 96 %.  General appearance: alert and no distress Neck: no adenopathy, no carotid bruit, no JVD, supple, symmetrical, trachea midline and thyroid not enlarged, symmetric, no tenderness/mass/nodules Lungs: clear to auscultation bilaterally Heart: regular rate and rhythm, S1, S2 normal, no murmur, click, rub or gallop Extremities: extremities normal, atraumatic, no cyanosis or edema Pulses: 2+ and symmetric Skin: Skin color, texture, turgor normal. No rashes or lesions Neurologic: Alert and oriented X 3, normal strength and tone. Normal symmetric reflexes. Normal coordination and gait  EKG sinus bradycardia 54 without ST or T wave changes.  I personally reviewed this EKG.  ASSESSMENT AND PLAN:   Atypical chest pain History of atypical chest pain with negative stress echo performed at [facility] 12/30/2018.  Hyperlipidemia History of hyperlipidemia on statin therapy although he ran out of his statin meds a month ago.  His most recent lipid profile performed 04/14/2020 revealed total cholesterol of 245, LDL 167 and HDL 58.  If indeed he was on a statin at that time we would need to consider alternative medication such as PCSK9.  I will recheck a lipid liver profile this morning and will refill his lipid-lowering medications.  Thoracic aortic aneurysm [facility]) History of thoracic aortic aneurysm measuring 42 mm by CTA 10/02/2019.  This will be repeated.  Benign essential HTN History of essential hypertension blood pressure measured today at 160/82.  He does admit to dietary indiscretion.  He is on hydrochlorothiazide.      Lorretta Harp MD FACP,FACC,FAHA, [facility] 10/22/2020 8:43 AM

## 2020-10-27 ENCOUNTER — Ambulatory Visit: Payer: 59 | Admitting: Physician Assistant

## 2020-10-27 ENCOUNTER — Other Ambulatory Visit: Payer: Self-pay

## 2020-10-27 VITALS — BP 157/83 | HR 78 | Temp 98.7°F | Resp 18 | Ht 74.5 in | Wt 263.0 lb

## 2020-10-27 DIAGNOSIS — E782 Mixed hyperlipidemia: Secondary | ICD-10-CM

## 2020-10-27 DIAGNOSIS — E119 Type 2 diabetes mellitus without complications: Secondary | ICD-10-CM

## 2020-10-27 DIAGNOSIS — I10 Essential (primary) hypertension: Secondary | ICD-10-CM | POA: Diagnosis not present

## 2020-10-27 DIAGNOSIS — Z794 Long term (current) use of insulin: Secondary | ICD-10-CM | POA: Diagnosis not present

## 2020-10-27 LAB — POCT GLYCOSYLATED HEMOGLOBIN (HGB A1C): Hemoglobin A1C: 9.6 % — AB (ref 4.0–5.6)

## 2020-10-27 MED ORDER — LISINOPRIL 5 MG PO TABS: 5.0000 mg | ORAL_TABLET | Freq: Every day | ORAL | 2 refills | Status: DC

## 2020-10-27 MED ORDER — METFORMIN HCL 500 MG PO TABS: 1000.0000 mg | ORAL_TABLET | Freq: Two times a day (BID) | ORAL | 1 refills | Status: DC

## 2020-10-27 MED ORDER — OZEMPIC (0.25 OR 0.5 MG/DOSE) 2 MG/1.5ML ~~LOC~~ SOPN: 0.5000 mg | PEN_INJECTOR | SUBCUTANEOUS | 2 refills | Status: DC

## 2020-10-27 NOTE — Progress Notes (Signed)
Established Patient Office Visit  Subjective:  Patient ID: Jose Bishop, male    DOB: Sep 20, 1965  Age: 55 y.o. MRN: [id number]  CC:  Chief Complaint  Patient presents with  . Hypertension    HPI Jose Bishop states that his BP readings at home have been elevated 160/116, 155/110 for the past couple of weeks.  Reports that he is compliant to his hydrochlorothiazide.  Reports that he has taken lisinopril in the past, does not know why it was not continued.  Reports that despite working out frequently at [employer], and watching his diet, he has regained the 10 to 15 pounds that he had previously lost.  Reports that he was started on Zetia this week by cardiology.   Past Medical History:  Diagnosis Date  . AAA (abdominal aortic aneurysm) ([place])   . Bradycardia   . Difficulty sleeping   . Essential hypertension   . Family history of stroke   . Headache   . History of transient ischemic attack (TIA)    15 YRS AGO  . Hyperlipidemia   . Impotence of organic origin   . Liver cyst   . Memory loss   . Osteoarthritis of acromioclavicular joint   . Prostate cancer ([place])   . Type 2 diabetes mellitus with hyperglycemia [facility])     Past Surgical History:  Procedure Laterality Date  . HERNIA REPAIR     X2 ( 1 ING HERNIA / 1 UMBILICAL HERNIA )  . LYMPHADENECTOMY Bilateral 01/04/2015   Procedure: BILATERAL LYMPHADENECTOMY;  Surgeon: Raynelle Bring, MD;  Location: [facility];  Service: Urology;  Laterality: Bilateral;  . MASS EXCISION     L UPPER ARM  . PENILE PROSTHESIS IMPLANT  03/18/2019  . ROBOT ASSISTED LAPAROSCOPIC RADICAL PROSTATECTOMY N/A 01/04/2015   Procedure: ROBOTIC ASSISTED LAPAROSCOPIC RADICAL PROSTATECTOMY LEVEL 2;  Surgeon: Raynelle Bring, MD;  Location: [facility];  Service: Urology;  Laterality: N/A;  . ROTATOR CUFF REPAIR Bilateral   . SURGERY SCROTAL / TESTICULAR      Family History  Problem Relation Age of Onset  . Anuerysm Mother   . Stroke Mother   .  Hypertension Father   . Peripheral vascular disease Father   . Stroke Father   . Colon cancer Father        18's  . Diabetes Sister   . Heart murmur Sister   . Heart murmur Son     Social History   Socioeconomic History  . Marital status: Married    Spouse name: Not on file  . Number of children: 5  . Years of education: Not on file  . Highest education level: Not on file  Occupational History  . Occupation: part time  Tobacco Use  . Smoking status: Never Smoker  . Smokeless tobacco: Never Used  Vaping Use  . Vaping Use: Never used  Substance and Sexual Activity  . Alcohol use: Yes    Alcohol/week: 0.0 standard drinks    Comment: OCCASIONAL  . Drug use: No  . Sexual activity: Yes    Partners: Female  Other Topics Concern  . Not on file  Social History Narrative  . Not on file   Social Determinants of Health   Financial Resource Strain: Not on file  Food Insecurity: Not on file  Transportation Needs: Not on file  Physical Activity: Not on file  Stress: Not on file  Social Connections: Not on file  Intimate Partner Violence: Not on file  Outpatient Medications Prior to Visit  Medication Sig Dispense Refill  . atorvastatin (LIPITOR) 80 MG tablet Take 1 tablet (80 mg total) by mouth daily. 90 tablet 3  . ezetimibe (ZETIA) 10 MG tablet Take 1 tablet (10 mg total) by mouth daily. 90 tablet 3  . hydrochlorothiazide (HYDRODIURIL) 25 MG tablet Take 1 tablet (25 mg total) by mouth daily. 90 tablet 3  . omeprazole (PRILOSEC) 40 MG capsule Take 1 capsule 30 minutes before breakfast 30 capsule 3  . ondansetron (ZOFRAN ODT) 4 MG disintegrating tablet Take 1 tablet (4 mg total) by mouth every 8 (eight) hours as needed for nausea or vomiting. 21 tablet 0  . metFORMIN (GLUCOPHAGE) 500 MG tablet Take 1 tablet (500 mg total) by mouth 2 (two) times daily with a meal. 180 tablet 1  . OZEMPIC, 0.25 OR 0.5 MG/DOSE, 2 MG/1.5ML SOPN Inject 0.5 mg into the skin once a week. 4 pen 2   . traZODone (DESYREL) 50 MG tablet Take 0.5-1 tablets (25-50 mg total) by mouth at bedtime as needed for sleep. 30 tablet 3  . DULoxetine (CYMBALTA) 30 MG capsule Take 1 tab PO once daily for one week then take 2 tabs PO once daily (Patient not taking: Reported on 10/27/2020) 60 capsule 1   No facility-administered medications prior to visit.    Allergies  Allergen Reactions  . Bee Venom Anaphylaxis  . Dapagliflozin-Metformin Hcl Er     ROS Review of Systems  Constitutional: Negative for chills and fever.  HENT: Negative.   Eyes: Negative.   Respiratory: Negative for shortness of breath.   Cardiovascular: Negative for chest pain.  Gastrointestinal: Negative.   Endocrine: Negative.   Genitourinary: Negative.   Musculoskeletal: Negative.   Skin: Negative.   Allergic/Immunologic: Negative.   Neurological: Negative.   Hematological: Negative.   Psychiatric/Behavioral: Negative.       Objective:    Physical Exam Vitals and nursing note reviewed.  Constitutional:      Appearance: Normal appearance. He is obese.  HENT:     Head: Normocephalic and atraumatic.     Right Ear: External ear normal.     Left Ear: External ear normal.     Nose: Nose normal.     Mouth/Throat:     Mouth: Mucous membranes are moist.     Pharynx: Oropharynx is clear.  Eyes:     Extraocular Movements: Extraocular movements intact.     Conjunctiva/sclera: Conjunctivae normal.     Pupils: Pupils are equal, round, and reactive to light.  Cardiovascular:     Rate and Rhythm: Normal rate and regular rhythm.     Pulses: Normal pulses.     Heart sounds: Normal heart sounds.  Pulmonary:     Effort: Pulmonary effort is normal.     Breath sounds: Normal breath sounds.  Musculoskeletal:        General: Normal range of motion.     Cervical back: Normal range of motion and neck supple.  Skin:    General: Skin is warm and dry.  Neurological:     General: No focal deficit present.     Mental Status: He  is alert.  Psychiatric:        Mood and Affect: Mood normal.        Behavior: Behavior normal.        Thought Content: Thought content normal.        Judgment: Judgment normal.     BP (!) 157/83 (BP Location: Left Arm, Patient  Position: Sitting, Cuff Size: Normal)   Pulse 78   Temp 98.7 F (37.1 C) (Oral)   Resp 18   Ht 6' 2.5" (1.892 m)   Wt 263 lb (119.3 kg)   SpO2 98%   BMI 33.32 kg/m  Wt Readings from Last 3 Encounters:  10/27/20 263 lb (119.3 kg)  10/22/20 266 lb 6.4 oz (120.8 kg)  10/19/20 261 lb 3.2 oz (118.5 kg)     Health Maintenance Due  Topic Date Due  . COVID-19 Vaccine (3 - Pfizer risk 4-dose series) 10/18/2019  . COLONOSCOPY (Pts 45-43yr Insurance coverage will need to be confirmed)  08/08/2020  . OPHTHALMOLOGY EXAM  09/08/2020    There are no preventive care reminders to display for this patient.  No results found for: TSH Lab Results  Component Value Date   WBC 4.4 04/30/2019   HGB 14.6 04/30/2019   HCT 44.6 04/30/2019   MCV 92.9 04/30/2019   PLT 315 04/30/2019   Lab Results  Component Value Date   NA 138 10/22/2020   K 4.8 10/22/2020   CO2 25 10/22/2020   GLUCOSE 262 (H) 10/22/2020   BUN 15 10/22/2020   CREATININE 1.24 10/22/2020   BILITOT 0.5 10/22/2020   ALKPHOS 67 10/22/2020   AST 17 10/22/2020   ALT 24 10/22/2020   PROT 7.7 10/22/2020   ALBUMIN 4.7 10/22/2020   CALCIUM 9.9 10/22/2020   ANIONGAP 12 04/30/2019   EGFR 69 10/22/2020   Lab Results  Component Value Date   CHOL 289 (H) 10/22/2020   Lab Results  Component Value Date   HDL 55 10/22/2020   Lab Results  Component Value Date   LDLCALC 208 (H) 10/22/2020   Lab Results  Component Value Date   TRIG 142 10/22/2020   Lab Results  Component Value Date   CHOLHDL 5.3 (H) 10/22/2020   Lab Results  Component Value Date   HGBA1C 9.6 (A) 10/27/2020      Assessment & Plan:   Problem List Items Addressed This Visit      Cardiovascular and Mediastinum   Benign  essential HTN   Relevant Medications   lisinopril (ZESTRIL) 5 MG tablet   Elevated blood pressure reading with diagnosis of hypertension - Primary   Relevant Medications   lisinopril (ZESTRIL) 5 MG tablet     Endocrine   Type 2 diabetes mellitus without complication, with long-term current use of insulin (HCC)   Relevant Medications   OZEMPIC, 0.25 OR 0.5 MG/DOSE, 2 MG/1.5ML SOPN   lisinopril (ZESTRIL) 5 MG tablet   metFORMIN (GLUCOPHAGE) 500 MG tablet   Other Relevant Orders   HgB A1c (Completed)     Other   Hyperlipidemia   Relevant Medications   lisinopril (ZESTRIL) 5 MG tablet     1. Elevated blood pressure reading with diagnosis of hypertension Patient encouraged to use my fitness pal to track food intake over the next 2 weeks and return to mobile unit for review.  Patient encouraged to look at sodium as well as added sugars.  2. Benign essential HTN Restart lisinopril, continue hydrochlorothiazide.  Patient encouraged to continue checking blood pressure on a daily basis, keep a written log and bring back to mobile unit for further review. - lisinopril (ZESTRIL) 5 MG tablet; Take 1 tablet (5 mg total) by mouth daily.  Dispense: 30 tablet; Refill: 2  3. Type 2 diabetes mellitus without complication, with long-term current use of insulin (HCC) A1c increased from 7.9 six months ago to 9.6.  Patient stated compliance to metformin and Ozempic.  Increase metformin 1000 mg twice a day.  Patient encouraged to check blood glucose levels on a daily basis, keep a written log and have available for all office visits - OZEMPIC, 0.25 OR 0.5 MG/DOSE, 2 MG/1.5ML SOPN; Inject 0.5 mg into the skin once a week.  Dispense: 1.5 mL; Refill: 2 - HgB A1c - metFORMIN (GLUCOPHAGE) 500 MG tablet; Take 2 tablets (1,000 mg total) by mouth 2 (two) times daily with a meal.  Dispense: 180 tablet; Refill: 1  4. Mixed hyperlipidemia Continue follow up with cardiology    I have reviewed the patient's  medical history (PMH, PSH, Social History, Family History, Medications, and allergies) , and have been updated if relevant. I spent 34 minutes reviewing chart and  face to face time with patient.     Meds ordered this encounter  Medications  . OZEMPIC, 0.25 OR 0.5 MG/DOSE, 2 MG/1.5ML SOPN    Sig: Inject 0.5 mg into the skin once a week.    Dispense:  1.5 mL    Refill:  2    Order Specific Question:   Supervising Provider    Answer:   Asencion Noble E [[id number]]  . lisinopril (ZESTRIL) 5 MG tablet    Sig: Take 1 tablet (5 mg total) by mouth daily.    Dispense:  30 tablet    Refill:  2    Order Specific Question:   Supervising Provider    Answer:   Joya Gaskins, PATRICK E [[id number]]  . metFORMIN (GLUCOPHAGE) 500 MG tablet    Sig: Take 2 tablets (1,000 mg total) by mouth 2 (two) times daily with a meal.    Dispense:  180 tablet    Refill:  1    Dose change    Order Specific Question:   Supervising Provider    Answer:   Elsie Stain [[id number]]    Follow-up: Return if symptoms worsen or fail to improve.    Loraine Grip Mayers, PA-C

## 2020-10-27 NOTE — Patient Instructions (Addendum)
Your A1C is 9.6. You will start taking 1000 mg of metformin twice a day   In addition to your hydrochlorothiazide, you will take lisinopril 5 mg once a day for your blood pressure.  I encourage you to continue checking your blood pressure on a daily basis, keep a written log and have available for all office visits.  I also encourage you to monitor your sodium intake over the next couple of weeks using my fitness pal.  Make sure that you increase your water intake as well.  I encourage you to work on improving your sleep, you should aim for 7 hours of restful sleep at night.  I encourage you to try melatonin over-the-counter.  Make sure to call your primary care provider for a health maintenance follow-up, please feel free to return to the mobile unit if needed if your blood pressure readings remain elevated.  Kennieth Rad, PA-C Physician Assistant [facility] [email or web address]    Low-Sodium Eating Plan Sodium, which is an element that makes up salt, helps you maintain a healthy balance of fluids in your body. Too much sodium can increase your blood pressure and cause fluid and waste to be held in your body. Your health care provider or dietitian may recommend following this plan if you have high blood pressure (hypertension), kidney disease, liver disease, or heart failure. Eating less sodium can help lower your blood pressure, reduce swelling, and protect your heart, liver, and kidneys. What are tips for following this plan? Reading food labels  The Nutrition Facts label lists the amount of sodium in one serving of the food. If you eat more than one serving, you must multiply the listed amount of sodium by the number of servings.  Choose foods with less than 140 mg of sodium per serving.  Avoid foods with 300 mg of sodium or more per serving. Shopping  Look for lower-sodium products, often labeled as "low-sodium" or "no salt  added."  Always check the sodium content, even if foods are labeled as "unsalted" or "no salt added."  Buy fresh foods. ? Avoid canned foods and pre-made or frozen meals. ? Avoid canned, cured, or processed meats.  Buy breads that have less than 80 mg of sodium per slice.   Cooking  Eat more home-cooked food and less restaurant, buffet, and fast food.  Avoid adding salt when cooking. Use salt-free seasonings or herbs instead of table salt or sea salt. Check with your health care provider or pharmacist before using salt substitutes.  Cook with plant-based oils, such as canola, sunflower, or olive oil.   Meal planning  When eating at a restaurant, ask that your food be prepared with less salt or no salt, if possible. Avoid dishes labeled as brined, pickled, cured, smoked, or made with soy sauce, miso, or teriyaki sauce.  Avoid foods that contain MSG (monosodium glutamate). MSG is sometimes added to [place] food, bouillon, and some canned foods.  Make meals that can be grilled, baked, poached, roasted, or steamed. These are generally made with less sodium. General information Most people on this plan should limit their sodium intake to 1,500-2,000 mg (milligrams) of sodium each day. What foods should I eat? Fruits Fresh, frozen, or canned fruit. Fruit juice. Vegetables Fresh or frozen vegetables. "No salt added" canned vegetables. "No salt added" tomato sauce and paste. Low-sodium or reduced-sodium tomato and vegetable juice. Grains Low-sodium cereals, including oats, puffed wheat and rice, and shredded wheat. Low-sodium crackers. Unsalted rice. Unsalted pasta. Low-sodium  bread. Whole-grain breads and whole-grain pasta. Meats and other proteins Fresh or frozen (no salt added) meat, poultry, seafood, and fish. Low-sodium canned tuna and salmon. Unsalted nuts. Dried peas, beans, and lentils without added salt. Unsalted canned beans. Eggs. Unsalted nut butters. Dairy Milk. Soy milk.  Cheese that is naturally low in sodium, such as ricotta cheese, fresh mozzarella, or Swiss cheese. Low-sodium or reduced-sodium cheese. Cream cheese. Yogurt. Seasonings and condiments Fresh and dried herbs and spices. Salt-free seasonings. Low-sodium mustard and ketchup. Sodium-free salad dressing. Sodium-free light mayonnaise. Fresh or refrigerated horseradish. Lemon juice. Vinegar. Other foods Homemade, reduced-sodium, or low-sodium soups. Unsalted popcorn and pretzels. Low-salt or salt-free chips. The items listed above may not be a complete list of foods and beverages you can eat. Contact a dietitian for more information. What foods should I avoid? Vegetables Sauerkraut, pickled vegetables, and relishes. Olives. [place] fries. Onion rings. Regular canned vegetables (not low-sodium or reduced-sodium). Regular canned tomato sauce and paste (not low-sodium or reduced-sodium). Regular tomato and vegetable juice (not low-sodium or reduced-sodium). Frozen vegetables in sauces. Grains Instant hot cereals. Bread stuffing, pancake, and biscuit mixes. Croutons. Seasoned rice or pasta mixes. Noodle soup cups. Boxed or frozen macaroni and cheese. Regular salted crackers. Self-rising flour. Meats and other proteins Meat or fish that is salted, canned, smoked, spiced, or pickled. Precooked or cured meat, such as sausages or meat loaves. Berniece Salines. Ham. Pepperoni. Hot dogs. Corned beef. Chipped beef. Salt pork. Jerky. Pickled herring. Anchovies and sardines. Regular canned tuna. Salted nuts. Dairy Processed cheese and cheese spreads. Hard cheeses. Cheese curds. Blue cheese. Feta cheese. String cheese. Regular cottage cheese. Buttermilk. Canned milk. Fats and oils Salted butter. Regular margarine. Ghee. Bacon fat. Seasonings and condiments Onion salt, garlic salt, seasoned salt, table salt, and sea salt. Canned and packaged gravies. Worcestershire sauce. Tartar sauce. Barbecue sauce. Teriyaki sauce. Soy sauce,  including reduced-sodium. Steak sauce. Fish sauce. Oyster sauce. Cocktail sauce. Horseradish that you find on the shelf. Regular ketchup and mustard. Meat flavorings and tenderizers. Bouillon cubes. Hot sauce. Pre-made or packaged marinades. Pre-made or packaged taco seasonings. Relishes. Regular salad dressings. Salsa. Other foods Salted popcorn and pretzels. Corn chips and puffs. Potato and tortilla chips. Canned or dried soups. Pizza. Frozen entrees and pot pies. The items listed above may not be a complete list of foods and beverages you should avoid. Contact a dietitian for more information. Summary  Eating less sodium can help lower your blood pressure, reduce swelling, and protect your heart, liver, and kidneys.  Most people on this plan should limit their sodium intake to 1,500-2,000 mg (milligrams) of sodium each day.  Canned, boxed, and frozen foods are high in sodium. Restaurant foods, fast foods, and pizza are also very high in sodium. You also get sodium by adding salt to food.  Try to cook at home, eat more fresh fruits and vegetables, and eat less fast food and canned, processed, or prepared foods. This information is not intended to replace advice given to you by your health care provider. Make sure you discuss any questions you have with your health care provider. Document Revised: 07/18/2019 Document Reviewed: 05/14/2019 Elsevier Patient Education  2021 [employer].

## 2020-10-27 NOTE — Progress Notes (Signed)
Patient has eaten today and patient has taken medication today. Patient denies pain. Patient reports HA on the left side this morning prior to eating. Refill Ozempic

## 2020-10-28 ENCOUNTER — Encounter: Payer: Self-pay | Admitting: *Deleted

## 2020-10-28 DIAGNOSIS — I10 Essential (primary) hypertension: Secondary | ICD-10-CM | POA: Insufficient documentation

## 2020-10-28 NOTE — Addendum Note (Signed)
Addended by: Beatrix Fetters on: 10/28/2020 08:58 AM   Modules accepted: Orders

## 2020-11-01 ENCOUNTER — Other Ambulatory Visit: Payer: Self-pay

## 2020-11-01 ENCOUNTER — Ambulatory Visit (INDEPENDENT_AMBULATORY_CARE_PROVIDER_SITE_OTHER)
Admission: RE | Admit: 2020-11-01 | Discharge: 2020-11-01 | Disposition: A | Discharge status: Home / Self Care | Payer: 59 | Source: Ambulatory Visit | Attending: Cardiovascular Disease | Admitting: Cardiovascular Disease

## 2020-11-01 DIAGNOSIS — I712 Thoracic aortic aneurysm, without rupture, unspecified: Secondary | ICD-10-CM

## 2020-11-01 MED ORDER — IOHEXOL 350 MG/ML SOLN
100.0000 mL | Freq: Once | INTRAVENOUS | Status: AC | PRN
  Administered 2020-11-01: 100 mL via INTRAVENOUS

## 2020-11-03 ENCOUNTER — Telehealth: Payer: Self-pay | Admitting: *Deleted

## 2020-11-03 NOTE — Telephone Encounter (Signed)
Amelia from [place] called to discuss med clearance for pt. Call transferred to nurse.

## 2020-11-03 NOTE — Telephone Encounter (Signed)
Clyde Canterbury called regarding medical clearance for patient's procedure.   Had appt with Cardiology 4/289/2022.  Will fax form again to [place].  Please refax to [phone].  Amelia's direct number is [phone] Please advise.

## 2020-11-03 NOTE — Telephone Encounter (Signed)
   [facility] Pre-operative Risk Assessment    Patient Name: ZAUL HUBERS  DOB: [birth year]  MRN: [id number]    Request for surgical clearance:  1. What type of surgery is being performed? FCE-functional capacity evaluation (consists of 3-4 hours of testing including sub-maximal treadmill walking, maximal load and frequent material handling, positional tolerance test, as well as isometric grio and oinch testing. This test often requires patient to perform task between 55-85 % of the age adjusted max heart rate)   2. When is this surgery scheduled? TBD   3. What type of clearance is required (medical clearance vs. Pharmacy clearance to hold med vs. Both)? medical  4. Are there any medications that need to be held prior to surgery and how long? No   5. Practice name and name of physician performing surgery? [facility]   6. What is the office phone number?  [phone]   7.   What is the office fax number? [phone], attn: Amelia  8.   Anesthesia type (None, local, MAC, general) ? none   Teliyah Royal A Laverne Klugh 11/03/2020, 4:22 PM  _________________________________________________________________   (provider comments below)

## 2020-11-04 ENCOUNTER — Other Ambulatory Visit: Payer: Self-pay

## 2020-11-04 ENCOUNTER — Ambulatory Visit (INDEPENDENT_AMBULATORY_CARE_PROVIDER_SITE_OTHER): Payer: 59 | Admitting: Licensed Clinical Social Worker

## 2020-11-04 DIAGNOSIS — F331 Major depressive disorder, recurrent, moderate: Secondary | ICD-10-CM | POA: Diagnosis not present

## 2020-11-04 NOTE — Telephone Encounter (Signed)
Clearance provided. See telephone encounter 11/03/20.   Loel Dubonnet, NP

## 2020-11-04 NOTE — Telephone Encounter (Signed)
   Name: Jose Bishop  DOB: 1965/08/02  MRN: [id number]   Primary Cardiologist: None  Chart reviewed as part of pre-operative protocol coverage. Patient was contacted 11/04/2020 in reference to pre-operative risk assessment for pending surgery as outlined below.  Jose Bishop was last seen on 10/22/20 by Dr. Gwenlyn Found.  Since that day, Jose Bishop has done well. Reports no anginal symptoms.   Therefore, based on ACC/AHA guidelines, the patient would be at acceptable risk for the planned procedure without further cardiovascular testing.   Of note, tells me blood pressure most recently was 140/88. He has been checking prior to medications and was encouraged to check at least one hour after his medications. Endorses compliance with both HCTZ and Lisinopril. Reviewed recommendation to rest for 5-10 minutes prior to checking blood pressure. He was reminded to bring BP log and BP cuff to next appointment.   The patient was advised that if he develops new symptoms prior to surgery to contact our office to arrange for a follow-up visit, and he verbalized understanding.  I will route this recommendation to the requesting party via Epic fax function and remove from pre-op pool. Please call with questions.  Loel Dubonnet, NP 11/04/2020, 10:41 AM

## 2020-11-04 NOTE — Telephone Encounter (Signed)
Cleared for "shoulder surgery at low risk

## 2020-11-04 NOTE — Telephone Encounter (Signed)
   Name: Jose Bishop  DOB: June 13, 1966  MRN: [id number]   Primary Cardiologist: None  Chart reviewed as part of pre-operative protocol coverage.  Left VM requesting call back and MyChart message sent.  Loel Dubonnet, NP 11/04/2020, 9:04 AM

## 2020-11-09 NOTE — Progress Notes (Signed)
   THERAPIST PROGRESS NOTE  Session Time: 45 min  Participation Level: Active  Behavioral Response: CasualAlertDepressed and frustrated  Type of Therapy: Individual Therapy  Treatment Goals addressed: Communication: dep/coping  Interventions: Supportive and Other: Additional assessment  Summary: Jose Bishop is a 55 y.o. male who presents with hx of MDD. This date pt returns for in person session. Pt's last session 07/07/20. 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 Pt reports he did graduate in March. He states when he got around to trying to get back in counselor was booked out. LCSW acknowledged the scheduling is challenging at this time. Pt states his feelings of dep are "5" on a 0-10 scale with 10 the worst. Pt advises he is still not working and finances are a big stressor. Pt reports wife has a new job with better pay and this is helping them keep the basics together. He still has some settlement money they are using. Pt advises he has his disability hearing Thurs of next wk, which will be virtual with his attorney in attendance. This is 2nd appeal. Pt remains very frustrated with process. Pt reports ongoing back pain and has restarted PT 2xwk with possible dry needling plan. LCSW assessed for status of marriage/relationship. Pt states they continue to struggle and are starting marital counseling next wk with the same church couple who did their premarital counseling. Pt feels positive about this and says they go in for "a tune up" intermittently. LCSW commended pt for this choice and provided encouragement. Pt states dtr remains away in college. He provides updates r/t ongoing Geraldine issues with 62 yr old son from a prior relationship. Pt states they have had to IVC him twice since last session and son is currently in [facility]. He provides many details of this stressor. Pt states son has been dx with schizophrenia. LCSW assisted pt to process  thoughts/feelings re this overall situation. Pt states he is mainly using smooth jazz to keep himself calm and coping as optimally as possible. He feels return to counseling will be beneficial. LCSW reviewed poc including scheduling. Pt states appreciation for care.    Suicidal/Homicidal: Nowithout intent/plan  Therapist Response: Pt states intent to reengage in counseling  Plan: Return again for next avail appt.  Diagnosis: Axis I: MDD, moderate  Hermine Messick, LCSW 11/09/2020

## 2020-11-10 NOTE — Telephone Encounter (Signed)
Medical clearance is not coming from PCP. Clearance approval was sent to Cardiology office. Forms was faxed to Abner Greenspan, RN at Cardiology office.   Left voicemail with Clyde Canterbury at Ortho.   Will forward message to [place] to fax medical clearance form to [place] with Ortho.

## 2020-11-10 NOTE — Telephone Encounter (Signed)
Jose Bishop from [place] calling asking for the med clearance to be faxed back to herfax [phone]. If there are any questions please call her at phone [phone].

## 2020-11-11 NOTE — Telephone Encounter (Signed)
Returned call to [facility] Clyde Canterbury states that she needs a FTE form signed for pt she states that this needs to be "signed by hand" and filled out per their office despite we have cleared him for this pre-op eval.  She states that this needs to be signed by Dr Gwenlyn Found. Clyde Canterbury will fax form over for Dr Gwenlyn Found to sign.

## 2020-11-11 NOTE — Telephone Encounter (Signed)
Patient called to say that the dr office havent received the clearance so that his able to have the procedure done. Please advise.

## 2020-11-11 NOTE — Telephone Encounter (Signed)
Called patient, advised that clearance was completed.  I faxed back over to [facility].  Patient verbalized understanding.

## 2020-11-11 NOTE — Telephone Encounter (Signed)
LM2CB with details...what is a Counselling psychologist form?

## 2020-11-11 NOTE — Telephone Encounter (Signed)
Call to say they fax over a form that needs to be check for the boxes and signature saying its okay for patient proceeded with procedure. The form is FCE form. Clyde Canterbury direct number is  [phone] and her direct fax is [phone]

## 2020-11-12 ENCOUNTER — Other Ambulatory Visit: Payer: Self-pay

## 2020-11-12 ENCOUNTER — Ambulatory Visit (HOSPITAL_COMMUNITY)
Admission: EM | Admit: 2020-11-12 | Discharge: 2020-11-12 | Disposition: A | Discharge status: Home / Self Care | Payer: 59

## 2020-11-12 DIAGNOSIS — I10 Essential (primary) hypertension: Secondary | ICD-10-CM

## 2020-11-12 NOTE — ED Triage Notes (Signed)
Pt presents with hypertension xs 2-3 days. States went to [facility] [employer] today and used BP machine and received reading of approx 150/100.

## 2020-11-12 NOTE — Telephone Encounter (Signed)
Paperwork received and signed by Dr. Gwenlyn Found.   Faxed back to [place] at [facility].

## 2020-11-12 NOTE — Discharge Instructions (Signed)
Take 2 of your 5 mg lisinopril tablets together daily alongside your hydrochlorothiazide that you take daily, document your home blood pressure readings at least twice a day for the next few days, call your primary care provider for a follow-up early next week to recheck your blood pressure readings.

## 2020-11-12 NOTE — Telephone Encounter (Signed)
Paperwork received and signed by Dr. Gwenlyn Found.   Faxed to [place] at [facility].

## 2020-11-12 NOTE — ED Provider Notes (Signed)
[place]    CSN: [id number] Arrival date & time: 11/12/20  1424      History   Chief Complaint Chief Complaint  Patient presents with  . Hypertension    HPI Jose Bishop is a 55 y.o. male.   Patient presenting today concerned about some elevated blood pressure readings the past 2 to 3 days.  States has been checking on his home monitor and at [facility] and has been getting readings between 140s to 160s over 90s to 110.  He is currently on hydrochlorothiazide and 2 weeks ago his primary added 5 mg of lisinopril to his regimen for persistently elevated blood pressure readings.  He denies chest pain, shortness of breath, palpitations, dizziness, headaches.  Tries to eat low-sodium diet and keep his stress levels down.  Takes his medication faithfully without side effects.     Past Medical History:  Diagnosis Date  . AAA (abdominal aortic aneurysm) ([place])   . Bradycardia   . Difficulty sleeping   . Essential hypertension   . Family history of stroke   . Headache   . History of transient ischemic attack (TIA)    15 YRS AGO  . Hyperlipidemia   . Impotence of organic origin   . Liver cyst   . Memory loss   . Osteoarthritis of acromioclavicular joint   . Prostate cancer ([place])   . Type 2 diabetes mellitus with hyperglycemia [facility])     Patient Active Problem List   Diagnosis Date Noted  . Elevated blood pressure reading with diagnosis of hypertension 10/28/2020  . Type 2 diabetes mellitus without complication, with long-term current use of insulin ([place]) 10/06/2019  . Benign essential HTN 10/06/2019  . Thoracic aortic aneurysm ([place]) 09/16/2019  . ED (erectile dysfunction) of organic origin 01/06/2019  . History of repair of rotator cuff 04/16/2018  . Hyperlipidemia 04/18/2017  . Atypical chest pain 01/24/2017  . Prostate cancer ([place]) 01/04/2015  . History of recurrent TIAs 10/02/2014  . Family history of cerebral aneurysm 10/02/2014    Past Surgical History:   Procedure Laterality Date  . HERNIA REPAIR     X2 ( 1 ING HERNIA / 1 UMBILICAL HERNIA )  . LYMPHADENECTOMY Bilateral 01/04/2015   Procedure: BILATERAL LYMPHADENECTOMY;  Surgeon: Raynelle Bring, MD;  Location: [facility];  Service: Urology;  Laterality: Bilateral;  . MASS EXCISION     L UPPER ARM  . PENILE PROSTHESIS IMPLANT  03/18/2019  . ROBOT ASSISTED LAPAROSCOPIC RADICAL PROSTATECTOMY N/A 01/04/2015   Procedure: ROBOTIC ASSISTED LAPAROSCOPIC RADICAL PROSTATECTOMY LEVEL 2;  Surgeon: Raynelle Bring, MD;  Location: [facility];  Service: Urology;  Laterality: N/A;  . ROTATOR CUFF REPAIR Bilateral   . SURGERY SCROTAL / TESTICULAR         Home Medications    Prior to Admission medications   Medication Sig Start Date End Date Taking? Authorizing Provider  atorvastatin (LIPITOR) 80 MG tablet Take 1 tablet (80 mg total) by mouth daily. 10/22/20   Lorretta Harp, MD  DULoxetine (CYMBALTA) 30 MG capsule Take 1 tab PO once daily for one week then take 2 tabs PO once daily Patient not taking: Reported on 10/27/2020 03/25/20   Mayers, Cari S, PA-C  ezetimibe (ZETIA) 10 MG tablet Take 1 tablet (10 mg total) by mouth daily. 10/22/20   Lorretta Harp, MD  hydrochlorothiazide (HYDRODIURIL) 25 MG tablet Take 1 tablet (25 mg total) by mouth daily. 10/22/20   Lorretta Harp, MD  lisinopril (ZESTRIL) 5  MG tablet Take 1 tablet (5 mg total) by mouth daily. 10/27/20   Mayers, Cari S, PA-C  metFORMIN (GLUCOPHAGE) 500 MG tablet Take 2 tablets (1,000 mg total) by mouth 2 (two) times daily with a meal. 10/27/20   Mayers, Cari S, PA-C  omeprazole (PRILOSEC) 40 MG capsule Take 1 capsule 30 minutes before breakfast 04/19/20   Willia Craze, NP  ondansetron (ZOFRAN ODT) 4 MG disintegrating tablet Take 1 tablet (4 mg total) by mouth every 8 (eight) hours as needed for nausea or vomiting. 05/06/20   Hall-Potvin, [place], PA-C  OZEMPIC, 0.25 OR 0.5 MG/DOSE, 2 MG/1.5ML SOPN Inject 0.5 mg into the skin once a week. 10/27/20    Mayers, Loraine Grip, PA-C    Family History Family History  Problem Relation Age of Onset  . Anuerysm Mother   . Stroke Mother   . Hypertension Father   . Peripheral vascular disease Father   . Stroke Father   . Colon cancer Father        17's  . Diabetes Sister   . Heart murmur Sister   . Heart murmur Son     Social History Social History   Tobacco Use  . Smoking status: Never Smoker  . Smokeless tobacco: Never Used  Vaping Use  . Vaping Use: Never used  Substance Use Topics  . Alcohol use: Yes    Alcohol/week: 0.0 standard drinks    Comment: OCCASIONAL  . Drug use: No     Allergies   Bee venom and Dapagliflozin-metformin hcl er   Review of Systems Review of Systems Per HPI  Physical Exam Triage Vital Signs ED Triage Vitals  Enc Vitals Group     BP 11/12/20 1440 (!) 144/89     Pulse Rate 11/12/20 1440 66     Resp 11/12/20 1440 17     Temp 11/12/20 1440 98.3 F (36.8 C)     Temp Source 11/12/20 1440 Oral     SpO2 11/12/20 1440 96 %     Weight --      Height --      Head Circumference --      Peak Flow --      Pain Score 11/12/20 1438 0     Pain Loc --      Pain Edu? --      Excl. in [place]? --    No data found.  Updated Vital Signs BP (!) 144/89 (BP Location: Left Arm)   Pulse 66   Temp 98.3 F (36.8 C) (Oral)   Resp 17   SpO2 96%   Visual Acuity Right Eye Distance:   Left Eye Distance:   Bilateral Distance:    Right Eye Near:   Left Eye Near:    Bilateral Near:     Physical Exam Vitals and nursing note reviewed.  Constitutional:      Appearance: Normal appearance.  HENT:     Head: Atraumatic.     Mouth/Throat:     Mouth: Mucous membranes are moist.     Pharynx: Oropharynx is clear.  Eyes:     Extraocular Movements: Extraocular movements intact.     Conjunctiva/sclera: Conjunctivae normal.  Cardiovascular:     Rate and Rhythm: Normal rate and regular rhythm.     Heart sounds: Normal heart sounds.  Pulmonary:     Effort: Pulmonary  effort is normal. No respiratory distress.     Breath sounds: Normal breath sounds.  Musculoskeletal:  General: Normal range of motion.     Cervical back: Normal range of motion and neck supple.  Skin:    General: Skin is warm and dry.  Neurological:     General: No focal deficit present.     Mental Status: He is oriented to person, place, and time.  Psychiatric:        Mood and Affect: Mood normal.        Thought Content: Thought content normal.        Judgment: Judgment normal.    UC Treatments / Results  Labs (all labs ordered are listed, but only abnormal results are displayed) Labs Reviewed - No data to display  EKG   Radiology No results found.  Procedures Procedures (including critical care time)  Medications Ordered in UC Medications - No data to display  Initial Impression / Assessment and Plan / UC Course  I have reviewed the triage vital signs and the nursing notes.  Pertinent labs & imaging results that were available during my care of the patient were reviewed by me and considered in my medical decision making (see chart for details).     Blood pressures still mildly elevated despite addition of lisinopril to regimen 2 weeks ago through PCP.  Will trial taking 2 of his 5 mg lisinopril tablets together to make 10 mg of the lisinopril for the next few days until he can follow-up with his primary care provider for a recheck.  Continue log of home blood pressures in the meantime, DASH diet, exercise, stress reduction.  If having low readings, dizziness, low heart rate, syncope follow-up immediately for further evaluation and stop the medication.  Patient agreeable and understanding of plan.  Final Clinical Impressions(s) / UC Diagnoses   Final diagnoses:  Essential hypertension     Discharge Instructions     Take 2 of your 5 mg lisinopril tablets together daily alongside your hydrochlorothiazide that you take daily, document your home blood pressure  readings at least twice a day for the next few days, call your primary care provider for a follow-up early next week to recheck your blood pressure readings.    ED Prescriptions    None     PDMP not reviewed this encounter.   Volney American, [place] 11/12/20 1623

## 2020-11-15 ENCOUNTER — Telehealth: Payer: Self-pay | Admitting: Cardiovascular Disease

## 2020-11-15 NOTE — Telephone Encounter (Signed)
Patient came into [facility] office requesting his form that  Dr.Berry completed from [facility]. I located form  (Functional Capacity Evaluation Clearance Form),  refaxed to [facility], and gave the patient a copy of the form.

## 2020-11-19 ENCOUNTER — Telehealth: Payer: Self-pay | Admitting: Family

## 2020-11-19 NOTE — Telephone Encounter (Signed)
Pt stated he needs a refill DULoxetine (CYMBALTA) 30 MG capsule Pharmacy  [facility], [address]  [address] [place], [place] [address]  Phone:  [phone] Fax:  [phone]  DEA #:  [id number]

## 2020-11-21 NOTE — Progress Notes (Signed)
Patient ID: Jose Bishop, male    DOB: 10/18/1965  MRN: [id number]  CC: Diabetes Follow-Up  Subjective: Jose Bishop is a 55 y.o. male who presents for diabetes follow-up.   His concerns today include:   1. DIABETES TYPE 2 FOLLOW-UP: 10/27/2020 per PA note: A1c increased from 7.9 six months ago to 9.6.  Patient stated compliance to metformin and Ozempic.  Increase metformin 1000 mg twice a day. Patient encouraged to check blood glucose levels on a daily basis, keep a written log and have available for all office visits  11/23/2020: Last A1C: 9.6% on 10/27/2020 Med Adherence:  [x]  Yes    []  No Medication side effects:  [x]  Yes , Metformin causing fatigue and nausea. Reports would like to continue with Metformin. Doing well on Ozempic.  Home Monitoring?  [x]  Yes    []  No Home glucose results range: 260's before and after eating Diet Adherence: [x]  Yes. Reports he does not eat carbs, rice, pasta, or sweets. Reports he does have beer from time to time and that he is aware that beer has some sugar/carbs. Exercise: [x]  Yes    []  No Hypoglycemic episodes?: []  Yes    [x]  No Numbness of the feet? []  Yes    []  No Comments: Reports concern of receiving a Prednisone injection for back pain several months ago. Prior to the injection blood sugars were baseline 120's - 130's. Subsequently blood sugars increased to 200's. Around the same time he was seen by another provider for the same reason of high blood sugars. He was given an insulin injection which decreased his blood sugars back to baseline 120's-130's. A couple days later blood sugars went back to the 200's. He is interested in how to decrease blood sugars back to baseline 120's - 130's. Reports his A1c increased after the Metformin dosage was increased and he does not understand why this happened considering he has a strict diet and exercise regimen. Declines referral to Endocrinology.   2. HYPERTENSION FOLLOW-UP: 10/27/2020 per PA note: Restart  lisinopril, continue hydrochlorothiazide.  Patient encouraged to continue checking blood pressure on a daily basis, keep a written log and bring back to mobile unit for further review.  Visit 11/12/2020 at [facility] per PA note: Blood pressures still mildly elevated despite addition of lisinopril to regimen 2 weeks ago through PCP. Will trial taking 2 of his 5 mg lisinopril tablets together to make 10 mg of the lisinopril for the next few days until he can follow-up with his primary care provider for a recheck.  Continue log of home blood pressures in the meantime, DASH diet, exercise, stress reduction.  If having low readings, dizziness, low heart rate, syncope follow-up immediately for further evaluation and stop the medication.  Patient agreeable and understanding of plan.  11/23/2020:  Currently taking: see medication list Med Adherence: [x]  Yes    []  No Medication side effects: []  Yes    [x]  No Adherence with salt restriction (low-salt diet): [x]  Yes    []  No Exercise: Yes [x]  No []  Home Monitoring?: [x]  Yes    []  No Monitoring Frequency: [x]  Yes    []  No Home BP results range: [x]  Yes, 140's-160's/90's-100's Smoking []  Yes [x]  No SOB? []  Yes    [x]  No Chest Pain?: []  Yes    [x]  No Leg swelling?: []  Yes    [x]  No Headaches?: []  Yes    [x]  No Dizziness? []  Yes    [x]   No Comments: Since Urgent Care discharge taking Lisinopril 10 mg and Hydrochlorothiazide as prescribed. Reports blood pressures remain elevated.   3. DEPRESSION FOLLOW-UP: 03/25/2020 per PA note: Trial Cymbalta 30 mg week 1, increase 60 mg week 2, referral for cognitive behavioral therapy.  Patient agrees to follow-up in mobile medicine unit in 4 weeks for evaluation of behavioral health medications   11/23/2020: Today requesting refills. Since last visit using Ibuprofen and Extra Strength Tylenol for back pain main management. Reports Cymbalta is for both depression and chronic back pain. Denies  thoughts of self-harm, suicidal ideations, and homicidal ideations.    Patient Active Problem List   Diagnosis Date Noted  . Elevated blood pressure reading with diagnosis of hypertension 10/28/2020  . Low back pain 09/27/2020  . Neck pain 09/27/2020  . DDD (degenerative disc disease), cervical 04/17/2020  . Degeneration of lumbar intervertebral disc 04/17/2020  . Type 2 diabetes mellitus without complication, with long-term current use of insulin ([place]) 10/06/2019  . Benign essential HTN 10/06/2019  . Thoracic aortic aneurysm ([place]) 09/16/2019  . ED (erectile dysfunction) of organic origin 01/06/2019  . History of repair of rotator cuff 04/16/2018  . Hyperlipidemia 04/18/2017  . Atypical chest pain 01/24/2017  . Prostate cancer ([place]) 01/04/2015  . History of recurrent TIAs 10/02/2014  . Family history of cerebral aneurysm 10/02/2014     Current Outpatient Medications on File Prior to Visit  Medication Sig Dispense Refill  . atorvastatin (LIPITOR) 80 MG tablet Take 1 tablet (80 mg total) by mouth daily. 90 tablet 3  . ezetimibe (ZETIA) 10 MG tablet Take 1 tablet (10 mg total) by mouth daily. 90 tablet 3  . metFORMIN (GLUCOPHAGE) 500 MG tablet Take 2 tablets (1,000 mg total) by mouth 2 (two) times daily with a meal. 180 tablet 1  . omeprazole (PRILOSEC) 40 MG capsule Take 1 capsule 30 minutes before breakfast 30 capsule 3   No current facility-administered medications on file prior to visit.    Allergies  Allergen Reactions  . Bee Venom Anaphylaxis  . Dapagliflozin-Metformin Hcl Er     Social History   Socioeconomic History  . Marital status: Married    Spouse name: Not on file  . Number of children: 5  . Years of education: Not on file  . Highest education level: Not on file  Occupational History  . Occupation: part time  Tobacco Use  . Smoking status: Never Smoker  . Smokeless tobacco: Never Used  Vaping Use  . Vaping Use: Never used  Substance and Sexual Activity   . Alcohol use: Yes    Alcohol/week: 0.0 standard drinks    Comment: OCCASIONAL  . Drug use: No  . Sexual activity: Yes    Partners: Female  Other Topics Concern  . Not on file  Social History Narrative  . Not on file   Social Determinants of Health   Financial Resource Strain: Not on file  Food Insecurity: Not on file  Transportation Needs: Not on file  Physical Activity: Not on file  Stress: Not on file  Social Connections: Not on file  Intimate Partner Violence: Not on file    Family History  Problem Relation Age of Onset  . Anuerysm Mother   . Stroke Mother   . Hypertension Father   . Peripheral vascular disease Father   . Stroke Father   . Colon cancer Father        53's  . Diabetes Sister   . Heart murmur Sister   .  Heart murmur Son     Past Surgical History:  Procedure Laterality Date  . HERNIA REPAIR     X2 ( 1 ING HERNIA / 1 UMBILICAL HERNIA )  . LYMPHADENECTOMY Bilateral 01/04/2015   Procedure: BILATERAL LYMPHADENECTOMY;  Surgeon: Raynelle Bring, MD;  Location: [facility];  Service: Urology;  Laterality: Bilateral;  . MASS EXCISION     L UPPER ARM  . PENILE PROSTHESIS IMPLANT  03/18/2019  . ROBOT ASSISTED LAPAROSCOPIC RADICAL PROSTATECTOMY N/A 01/04/2015   Procedure: ROBOTIC ASSISTED LAPAROSCOPIC RADICAL PROSTATECTOMY LEVEL 2;  Surgeon: Raynelle Bring, MD;  Location: [facility];  Service: Urology;  Laterality: N/A;  . ROTATOR CUFF REPAIR Bilateral   . SURGERY SCROTAL / TESTICULAR      ROS: Review of Systems Negative except as stated above  PHYSICAL EXAM: BP (!) 143/92 (BP Location: Left Arm, Patient Position: Sitting, Cuff Size: Large)   Pulse 64   Temp 98.4 F (36.9 C)   Resp 15   Ht 6' 2.49" (1.892 m)   Wt 258 lb (117 kg)   SpO2 96%   BMI 32.69 kg/m   Wt Readings from Last 3 Encounters:  11/23/20 258 lb (117 kg)  10/27/20 263 lb (119.3 kg)  10/22/20 266 lb 6.4 oz (120.8 kg)    Physical Exam HENT:     Head: Normocephalic and atraumatic.   Eyes:     Extraocular Movements: Extraocular movements intact.     Conjunctiva/sclera: Conjunctivae normal.     Pupils: Pupils are equal, round, and reactive to light.  Cardiovascular:     Rate and Rhythm: Normal rate and regular rhythm.     Pulses: Normal pulses.     Heart sounds: Normal heart sounds.  Pulmonary:     Effort: Pulmonary effort is normal.     Breath sounds: Normal breath sounds.  Musculoskeletal:     Cervical back: Normal range of motion and neck supple.  Neurological:     General: No focal deficit present.     Mental Status: He is alert and oriented to person, place, and time.  Psychiatric:        Mood and Affect: Mood normal.        Behavior: Behavior normal.    ASSESSMENT AND PLAN: 1. Type 2 diabetes mellitus without complication, without long-term current use of insulin ([place]): - Last hemoglobin A1c 9.6% on 10/27/2020. This is increased from previous hemoglobin A1c of 7.9% on 04/14/2020. Next hemoglobin A1c due August 2022.   - Patient has concern about blood sugars consistently in the 200's before and after eating. Endorses strict diet and exercise regimen. Does consume beer.  - Continue Metformin as prescribed. Reports some mild gastrointestinal side effects and fatigue but able to tolerant this as of present. - Increase Ozempic from 0.5 mg weekly to 1 mg weekly.  - Patient is intolerant to Dapagliflozin. - To achieve an A1C goal of less than or equal to 7.0 percent, a fasting blood sugar of 80 to 130 mg/dL and a postprandial glucose (90 to 120 minutes after a meal) less than 180 mg/dL. In the event of sugars less than 60 mg/dl or greater than 400 mg/dl please notify the clinic ASAP. It is recommended that you undergo annual eye exams and annual foot exams. - Discussed the importance of healthy eating habits, low-carbohydrate diet, low-sugar diet, regular aerobic exercise (at least 150 minutes a week as tolerated) and medication compliance to achieve or maintain  control of diabetes. - Patient declined referral to Endocrinology  as of present. - Follow-up with primary provider in 4 weeks or sooner if needed. - OZEMPIC, 0.25 OR 0.5 MG/DOSE, 2 MG/1.5ML SOPN; Inject 1 mg into the skin once a week.  Dispense: 3 mL; Refill: 2  2. Essential hypertension: - Blood pressure not at goal during today's visit. Patient asymptomatic without chest pressure, chest pain, palpitations, shortness of breath, and worst headache of life. - Increase Lisinopril from 10 mg daily to 20 mg daily.  - Continue Hydrochlorothiazide as prescribed.  - Counseled on blood pressure goal of less than 130/80, low-sodium, DASH diet, medication compliance, 150 minutes of moderate intensity exercise per week as tolerated. Discussed medication compliance, adverse effects. - Follow-up with primary provider in 2 weeks or sooner if needed.  - lisinopril (ZESTRIL) 20 MG tablet; Take 1 tablet (20 mg total) by mouth daily.  Dispense: 90 tablet; Refill: 0 - hydrochlorothiazide (HYDRODIURIL) 25 MG tablet; Take 1 tablet (25 mg total) by mouth daily.  Dispense: 90 tablet; Refill: 0   3. Severe episode of recurrent major depressive disorder, without psychotic features ([place]): - Stable.  - Denies thoughts of self-harm, suicidal ideations, and homicidal ideations.  - Continue Duloxetine as prescribed.  - Follow-up with primary provider as scheduled.  - DULoxetine (CYMBALTA) 30 MG capsule; Take 1 tablet (30 mg total) by mouth once daily for 14 days. Then increase to 2 tablets (60 mg total) by mouth once daily as tolerated.  Dispense: 42 capsule; Refill: 1  4. Musculoskeletal pain: - Continue Duloxetine as prescribed.  - Follow-up with primary provider as scheduled.  - DULoxetine (CYMBALTA) 30 MG capsule; Take 1 tablet (30 mg total) by mouth once daily for 14 days. Then increase to 2 tablets (60 mg total) by mouth once daily as tolerated.  Dispense: 42 capsule; Refill: 1    Patient was given the  opportunity to ask questions.  Patient verbalized understanding of the plan and was able to repeat key elements of the plan. Patient was given clear instructions to go to Emergency Department or return to medical center if symptoms don't improve, worsen, or new problems develop.The patient verbalized understanding.   Requested Prescriptions   Signed Prescriptions Disp Refills  . OZEMPIC, 0.25 OR 0.5 MG/DOSE, 2 MG/1.5ML SOPN 3 mL 2    Sig: Inject 1 mg into the skin once a week.  Marland Kitchen glucose blood test strip 100 each 12    Sig: Use as instructed  . DULoxetine (CYMBALTA) 30 MG capsule 42 capsule 1    Sig: Take 1 tablet (30 mg total) by mouth once daily for 14 days. Then increase to 2 tablets (60 mg total) by mouth once daily as tolerated.  Marland Kitchen lisinopril (ZESTRIL) 20 MG tablet 90 tablet 0    Sig: Take 1 tablet (20 mg total) by mouth daily.  . hydrochlorothiazide (HYDRODIURIL) 25 MG tablet 90 tablet 0    Sig: Take 1 tablet (25 mg total) by mouth daily.    Return in about 2 weeks (around 12/07/2020) for Follow-Up 2 weeks hypertension and 4 weeks telemed diabetes .  Camillia Herter, NP

## 2020-11-23 ENCOUNTER — Other Ambulatory Visit: Payer: Self-pay

## 2020-11-23 ENCOUNTER — Ambulatory Visit (INDEPENDENT_AMBULATORY_CARE_PROVIDER_SITE_OTHER): Payer: 59 | Admitting: Family

## 2020-11-23 ENCOUNTER — Encounter: Payer: Self-pay | Admitting: Family

## 2020-11-23 VITALS — BP 143/92 | HR 64 | Temp 98.4°F | Resp 15 | Ht 74.49 in | Wt 258.0 lb

## 2020-11-23 DIAGNOSIS — M7918 Myalgia, other site: Secondary | ICD-10-CM

## 2020-11-23 DIAGNOSIS — E119 Type 2 diabetes mellitus without complications: Secondary | ICD-10-CM | POA: Diagnosis not present

## 2020-11-23 DIAGNOSIS — I10 Essential (primary) hypertension: Secondary | ICD-10-CM

## 2020-11-23 DIAGNOSIS — F332 Major depressive disorder, recurrent severe without psychotic features: Secondary | ICD-10-CM | POA: Diagnosis not present

## 2020-11-23 MED ORDER — GLUCOSE BLOOD VI STRP: ORAL_STRIP | 12 refills | Status: DC

## 2020-11-23 MED ORDER — DULOXETINE HCL 30 MG PO CPEP: ORAL_CAPSULE | ORAL | 1 refills | Status: DC

## 2020-11-23 MED ORDER — LISINOPRIL 20 MG PO TABS: 20.0000 mg | ORAL_TABLET | Freq: Every day | ORAL | 0 refills | Status: DC

## 2020-11-23 MED ORDER — HYDROCHLOROTHIAZIDE 25 MG PO TABS: 25.0000 mg | ORAL_TABLET | Freq: Every day | ORAL | 0 refills | Status: DC

## 2020-11-23 MED ORDER — OZEMPIC (0.25 OR 0.5 MG/DOSE) 2 MG/1.5ML ~~LOC~~ SOPN: 1.0000 mg | PEN_INJECTOR | SUBCUTANEOUS | 2 refills | Status: DC

## 2020-11-23 NOTE — Progress Notes (Signed)
Diabetes f/u Needs refill on Duloxetine, Hydrochlorothiazide, Lisinopril, Ozempic Not sure why his BP is still not within range  Pt stated BS stated this morning was 267

## 2020-11-26 ENCOUNTER — Ambulatory Visit (INDEPENDENT_AMBULATORY_CARE_PROVIDER_SITE_OTHER): Payer: 59 | Admitting: Pharmacist Clinician (PhC)/ Clinical Pharmacy Specialist

## 2020-11-26 ENCOUNTER — Other Ambulatory Visit: Payer: Self-pay

## 2020-11-26 DIAGNOSIS — I10 Essential (primary) hypertension: Secondary | ICD-10-CM | POA: Diagnosis not present

## 2020-11-26 DIAGNOSIS — E782 Mixed hyperlipidemia: Secondary | ICD-10-CM

## 2020-11-26 MED ORDER — CHLORTHALIDONE 25 MG PO TABS: 25.0000 mg | ORAL_TABLET | Freq: Every day | ORAL | 3 refills | Status: DC

## 2020-11-26 NOTE — Progress Notes (Signed)
11/26/2020 Jose Bishop 1966/04/22 [id number]   HPI:  Jose Bishop is a 55 y.o. male patient of Dr Gwenlyn Found, with a PMH below who presents today for both hypertension and lipid clinic evaluation.  He was seen by Dr. Gwenlyn Found in late April and found to have a pressure of 160/82.  At that time he was on hydrochlorothiazide.  No changes were made, as he noted dietary indiscretions.  He was asked to monitor and follow up with CVRR.  About 2 weeks later lisinopril 5 mg was added at another MD visit.  On May 20 he was seen in the ED because of elevated blood pressures and his lisinopril was increased to 10 mg daily, then to 20 mg at PCP appointment earlier this week.     He is here today for follow up.  Recent labs showed a familial hyperlipidemia, with his LDL at 208.  He has been on atorvastatin 80 mg causes myalgias and fatigue.  He just learned yesterday that he has been approved for disability after shoulder injury on the job.  Currently he has [employer] but will be getting set up with Medicare in the next few weeks.     Past Medical History: hyperlipidemia 4/22 LDL 208 -on atorvastatin 80  hypertension 143/92 at last OV - on hctz 25 mg, lisinopril 20 mg  Thoracic aortic aneurysm 42 mm ascending thoracic aorta by CTA 4/21  DM2 5/22 A1c 9.6 - on metformin, ozempic  TIA 2 times, 2003, 2205 - still has occasional foggy brain  Prostate cancer 2016 prostatectomy      Blood Pressure Goal:  130/80  Current Medications:  Atorvastatin 80 mg, ezetimibe 10 mg   Lisinopril 20 mg qd, hydrochlorothiazide 25 mg  Family Hx: father lost both lower legs d/t gangrene ;carotid endarterecomy x 2 ; now 45; mother died from stroke at 26; brother with hypertension, DM. Sister with DM; 3 children [phone] - healthy  Social Hx: no tobacco,  occasional social alcohol; no coffee, some tea, has cut back on soda, now more water  Diet: eating mostly home cooked meals now  Exercise: PT at [facility] for  shoulder, goes to [employer]  2-3 times per week - 5 miles on bike, or 10 treadmill, and shoulder exercises  Home BP readings: does checks at home, doesn't think cuff is accurate, readings always higher than in MD office or [employer]  Intolerances: dapagliflozin, statins  Labs: 4/22: Na 138, K 4.8, Glu 262, BUN 15, SCr 1.24 GFR 69; A1c 9.6  TC 289, TG 142, HDL 55, LDL 208 (on atorvastatin 80, ezetimibe 10)   Wt Readings from Last 3 Encounters:  11/26/20 260 lb (117.9 kg)  11/23/20 258 lb (117 kg)  10/27/20 263 lb (119.3 kg)   BP Readings from Last 3 Encounters:  11/26/20 140/90  11/23/20 (!) 143/92  11/12/20 (!) 144/89   Pulse Readings from Last 3 Encounters:  11/26/20 83  11/23/20 64  11/12/20 66    Current Outpatient Medications  Medication Sig Dispense Refill  . atorvastatin (LIPITOR) 80 MG tablet Take 1 tablet (80 mg total) by mouth daily. 90 tablet 3  . chlorthalidone (HYGROTON) 25 MG tablet Take 1 tablet (25 mg total) by mouth daily. 30 tablet 3  . DULoxetine (CYMBALTA) 30 MG capsule Take 1 tablet (30 mg total) by mouth once daily for 14 days. Then increase to 2 tablets (60 mg total) by mouth once daily as tolerated. 42 capsule 1  .  ezetimibe (ZETIA) 10 MG tablet Take 1 tablet (10 mg total) by mouth daily. 90 tablet 3  . glucose blood test strip Use as instructed 100 each 12  . lisinopril (ZESTRIL) 20 MG tablet Take 1 tablet (20 mg total) by mouth daily. 90 tablet 0  . metFORMIN (GLUCOPHAGE) 500 MG tablet Take 2 tablets (1,000 mg total) by mouth 2 (two) times daily with a meal. 180 tablet 1  . omeprazole (PRILOSEC) 40 MG capsule Take 1 capsule 30 minutes before breakfast 30 capsule 3  . OZEMPIC, 0.25 OR 0.5 MG/DOSE, 2 MG/1.5ML SOPN Inject 1 mg into the skin once a week. 3 mL 2   No current facility-administered medications for this visit.    Allergies  Allergen Reactions  . Bee Venom Anaphylaxis  . Dapagliflozin-Metformin Hcl Er   . Lipitor [Atorvastatin]      FATIGUED NAUSEA BRAIN FOG    Past Medical History:  Diagnosis Date  . AAA (abdominal aortic aneurysm) ([place])   . Bradycardia   . Difficulty sleeping   . Essential hypertension   . Family history of stroke   . Headache   . History of transient ischemic attack (TIA)    15 YRS AGO  . Hyperlipidemia   . Impotence of organic origin   . Liver cyst   . Memory loss   . Osteoarthritis of acromioclavicular joint   . Prostate cancer ([place])   . Type 2 diabetes mellitus with hyperglycemia ([facility])     Blood pressure 140/90, pulse 83, resp. rate 17, height 6' 2.5" (1.892 m), weight 260 lb (117.9 kg), SpO2 99 %.  Benign essential HTN Patient with essential hypertension, still not at goal blood pressure.  His lisinopril was increased only a few days ago, so would not expect the full benefit from that quite yet.  We will go ahead and switch his hydrochlorothiazide to chlorthalidone 25 mg today, since he is about out of the hctz.  Asked that he continue with home BP checks, even though he is not sure of the accuracy of his home meter.  He should bring that to his next appointment, along with a list of readings, and we can verify accuracy at that time.  We will see him back in the office in 6 weeks for follow up.    Hyperlipidemia Patient with familial hyperlipidemia - LDL at 208 despite taking high intensity statin and ezetimibe.  We will discontinue the atorvastatin for now, as it makes him feel poorly, and continue with the ezetimibe.  Reviewed information about PCSK-9 inhibitors.  Discussed mechanisms of action, dosing, side effects and potential decreases in LDL cholesterol.  Answered all patient questions.  Based on this information, patient would prefer to start the [place] Pushtronix monthly.  Because he will have new insurance in July, we gave him a sample from the office today to use for the month of June.  He is to contact the office once he has insurance straightened out with the [place] and disability.      Tommy Medal PharmD CPP [facility] [address] [place], [place] [address] [phone]

## 2020-11-26 NOTE — Assessment & Plan Note (Addendum)
Patient with familial hyperlipidemia - LDL at 208 despite taking high intensity statin and ezetimibe.  We will discontinue the atorvastatin for now, as it makes him feel poorly, and continue with the ezetimibe.  Reviewed information about PCSK-9 inhibitors.  Discussed mechanisms of action, dosing, side effects and potential decreases in LDL cholesterol.  Answered all patient questions.  Based on this information, patient would prefer to start the [place] Pushtronix monthly.  Because he will have new insurance in July, we gave him a sample from the office today to use for the month of June.  He is to contact the office once he has insurance straightened out with the [place] and disability.

## 2020-11-26 NOTE — Assessment & Plan Note (Addendum)
Patient with essential hypertension, still not at goal blood pressure.  His lisinopril was increased only a few days ago, so would not expect the full benefit from that quite yet.  We will go ahead and switch his hydrochlorothiazide to chlorthalidone 25 mg today, since he is about out of the hctz.  Asked that he continue with home BP checks, even though he is not sure of the accuracy of his home meter.  He should bring that to his next appointment, along with a list of readings, and we can verify accuracy at that time.  We will see him back in the office in 6 weeks for follow up.

## 2020-11-26 NOTE — Patient Instructions (Signed)
Return for a a follow up appointment July 12 at 8:30  Check your blood pressure at home daily and keep record of the readings.  Take your BP meds as follows:  Stop hydrochlorothiazide.  Start chlorthalidone 25 mg once daily  Bring all of your meds, your BP cuff and your record of home blood pressures to your next appointment.  Exercise as you're able, try to walk approximately 30 minutes per day.  Keep salt intake to a minimum, especially watch canned and prepared boxed foods.  Eat more fresh fruits and vegetables and fewer canned items.  Avoid eating in fast food restaurants.    HOW TO TAKE YOUR BLOOD PRESSURE: . Rest 5 minutes before taking your blood pressure. .  Don't smoke or drink caffeinated beverages for at least 30 minutes before. . Take your blood pressure before (not after) you eat. . Sit comfortably with your back supported and both feet on the floor (don't cross your legs). . Elevate your arm to heart level on a table or a desk. . Use the proper sized cuff. It should fit smoothly and snugly around your bare upper arm. There should be enough room to slip a fingertip under the cuff. The bottom edge of the cuff should be 1 inch above the crease of the elbow. . Ideally, take 3 measurements at one sitting and record the average.  Your Results:             Your most recent labs Goal  Total Cholesterol 289 < 200  Triglycerides 142 < 150  HDL (happy/good cholesterol) 55 > 40  LDL (lousy/bad cholesterol 208 < 100   Medication changes:  Use [place] Pushtronix once monthly.    Stop atorvastatin.  Continue with ezetimibe for now  Lab orders:   Thank you for choosing [facility]

## 2020-12-06 NOTE — Progress Notes (Signed)
Patient ID: [name], male    DOB: Oct 27, 1965  MRN: [id number]  CC: Hypertension Follow-Up   Subjective: Jose Bishop is a 55 y.o. male who presents for hypertension follow-up.   His concerns today include:   HYPERTENSION FOLLOW-UP: 11/23/2020: - Increase Lisinopril from 10 mg daily to 20 mg daily. - Continue Hydrochlorothiazide as prescribed. - Follow-up with primary provider in 2 weeks or sooner if needed.   Visit 11/26/2020 at [facility] per Pharmacist note: Patient with essential hypertension, still not at goal blood pressure. His lisinopril was increased only a few days ago, so would not expect the full benefit from that quite yet.  We will go ahead and switch his hydrochlorothiazide to chlorthalidone 25 mg today, since he is about out of the hctz.  Asked that he continue with home BP checks, even though he is not sure of the accuracy of his home meter.  He should bring that to his next appointment, along with a list of readings, and we can verify accuracy at that time.  We will see him back in the office in 6 weeks for follow up.    12/07/2020: Doing well on current blood pressure medication regimen.   2. TYPE 2 DIABETES FOLLOW-UP: Since last visit decided would like referral to Endocrinology for management of diabetes and autoimmune disorders screening. Concern of side effects of Metformin causing diarrhea. Interested in possibly beginning [place] reporting he did take this in the past.   3. NERVE PAIN: Reports left lateral thigh numbness and tingling for about 4 months. Described as a burning sensation and hurts to the touch. Sensation of dull pressure when touching. Tried massage without relief. Denies redness, warmth, swelling, chest pain, and shortness of breath.   4. THYROID SCREEN: Requesting TSH.  Patient Active Problem List   Diagnosis Date Noted   Elevated blood pressure reading with diagnosis of hypertension 10/28/2020   Low back pain 09/27/2020    Neck pain 09/27/2020   DDD (degenerative disc disease), cervical 04/17/2020   Degeneration of lumbar intervertebral disc 04/17/2020   Type 2 diabetes mellitus without complication, with long-term current use of insulin ([place]) 10/06/2019   Benign essential HTN 10/06/2019   Thoracic aortic aneurysm ([place]) 09/16/2019   ED (erectile dysfunction) of organic origin 01/06/2019   History of repair of rotator cuff 04/16/2018   Hyperlipidemia 04/18/2017   Atypical chest pain 01/24/2017   Prostate cancer ([place]) 01/04/2015   History of recurrent TIAs 10/02/2014   Family history of cerebral aneurysm 10/02/2014     Current Outpatient Medications on File Prior to Visit  Medication Sig Dispense Refill   atorvastatin (LIPITOR) 80 MG tablet Take 1 tablet (80 mg total) by mouth daily. 90 tablet 3   chlorthalidone (HYGROTON) 25 MG tablet Take 1 tablet (25 mg total) by mouth daily. 30 tablet 3   DULoxetine (CYMBALTA) 30 MG capsule Take 1 tablet (30 mg total) by mouth once daily for 14 days. Then increase to 2 tablets (60 mg total) by mouth once daily as tolerated. 42 capsule 1   ezetimibe (ZETIA) 10 MG tablet Take 1 tablet (10 mg total) by mouth daily. 90 tablet 3   glucose blood test strip Use as instructed 100 each 12   latanoprost (XALATAN) 0.005 % ophthalmic solution 1 drop at bedtime.     lisinopril (ZESTRIL) 20 MG tablet Take 1 tablet (20 mg total) by mouth daily. 90 tablet 0   omeprazole (PRILOSEC) 40 MG capsule Take 1 capsule  30 minutes before breakfast 30 capsule 3   OZEMPIC, 0.25 OR 0.5 MG/DOSE, 2 MG/1.5ML SOPN Inject 1 mg into the skin once a week. 3 mL 2   No current facility-administered medications on file prior to visit.    Allergies  Allergen Reactions   Bee Venom Anaphylaxis   Dapagliflozin-Metformin Hcl Er    Lipitor [Atorvastatin]     FATIGUED NAUSEA BRAIN FOG    Social History   Socioeconomic History   Marital status: Married    Spouse name: Not on file   Number of children:  5   Years of education: Not on file   Highest education level: Not on file  Occupational History   Occupation: part time  Tobacco Use   Smoking status: Never   Smokeless tobacco: Never  Vaping Use   Vaping Use: Never used  Substance and Sexual Activity   Alcohol use: Yes    Alcohol/week: 0.0 standard drinks    Comment: OCCASIONAL   Drug use: No   Sexual activity: Yes    Partners: Female  Other Topics Concern   Not on file  Social History Narrative   Not on file   Social Determinants of Health   Financial Resource Strain: Not on file  Food Insecurity: Not on file  Transportation Needs: Not on file  Physical Activity: Not on file  Stress: Not on file  Social Connections: Not on file  Intimate Partner Violence: Not on file    Family History  Problem Relation Age of Onset   Anuerysm Mother    Stroke Mother    Hypertension Father    Peripheral vascular disease Father    Stroke Father    Colon cancer Father        32's   Diabetes Sister    Heart murmur Sister    Heart murmur Son     Past Surgical History:  Procedure Laterality Date   HERNIA REPAIR     X2 ( 1 ING HERNIA / 1 UMBILICAL HERNIA )   LYMPHADENECTOMY Bilateral 01/04/2015   Procedure: BILATERAL LYMPHADENECTOMY;  Surgeon: Raynelle Bring, MD;  Location: [facility];  Service: Urology;  Laterality: Bilateral;   MASS EXCISION     L UPPER ARM   PENILE PROSTHESIS IMPLANT  03/18/2019   ROBOT ASSISTED LAPAROSCOPIC RADICAL PROSTATECTOMY N/A 01/04/2015   Procedure: ROBOTIC ASSISTED LAPAROSCOPIC RADICAL PROSTATECTOMY LEVEL 2;  Surgeon: Raynelle Bring, MD;  Location: [facility];  Service: Urology;  Laterality: N/A;   ROTATOR CUFF REPAIR Bilateral    SURGERY SCROTAL / TESTICULAR      ROS: Review of Systems Negative except as stated above  PHYSICAL EXAM: BP 130/87 (BP Location: Left Arm, Patient Position: Sitting, Cuff Size: Normal)   Pulse 67   Temp 98.4 F (36.9 C) (Temporal)   Resp 15   Ht 6' 2.49" (1.892 m)   Wt  255 lb 3.2 oz (115.8 kg)   SpO2 96%   BMI 32.34 kg/m   Physical Exam General appearance - alert, well appearing, and in no distress and oriented to person, place, and time Mental status - alert, oriented to person, place, and time, normal mood, behavior, speech, dress, motor activity, and thought processes Neck - supple, no significant adenopathy Chest - clear to auscultation, no wheezes, rales or rhonchi, symmetric air entry, no tachypnea, retractions or cyanosis Heart - normal rate, regular rhythm, normal S1, S2, no murmurs, rubs, clicks or gallops Neurological - alert, oriented, normal speech, no focal findings or movement disorder noted,  left lateral thigh with decreased sensation upon palpation.  ASSESSMENT AND PLAN: 1. Essential hypertension: - Continue Lisinopril and Chlorthalidone as prescribed.  - Keep all follow-up appointments with Cardiology.   2. Type 2 diabetes mellitus without complication, without long-term current use of insulin ([place]): - Continue Ozempic as prescribed.  - Will change Metformin to Metformin XR at the same dose, route, and frequency to hopefully help minimize side effects of diarrhea.  - Patient is intolerant to Dapagliflozin. - Per patient request referral to Endocrinology for further evaluation and management. Patient also interested in autoimmune disorders screening.  - metFORMIN (GLUCOPHAGE-XR) 500 MG 24 hr tablet; Take 2 tablets (1,000 mg total) by mouth 2 (two) times daily with a meal.  Dispense: 120 tablet; Refill: 2 - Ambulatory referral to Endocrinology  3. Sciatic nerve pain, left: - Begin Gabapentin as prescribed. May cause drowsiness. Counseled patient to not consume if operating heavy machinery or driving. Counseled patient to not consume with alcohol or illicit substances. Patient verbalized understanding.  - Follow-up with primary provider in 4 weeks or sooner if needed.  - gabapentin (NEURONTIN) 300 MG capsule; Take 1 capsule (300 mg total)  by mouth at bedtime.  Dispense: 30 capsule; Refill: 0  4. Thyroid disorder screen: - TSH to check thyroid function.  - TSH    Patient was given the opportunity to ask questions.  Patient verbalized understanding of the plan and was able to repeat key elements of the plan. Patient was given clear instructions to go to Emergency Department or return to medical center if symptoms don't improve, worsen, or new problems develop.The patient verbalized understanding.   Orders Placed This Encounter  Procedures   TSH   Ambulatory referral to Endocrinology     Requested Prescriptions   Signed Prescriptions Disp Refills   gabapentin (NEURONTIN) 300 MG capsule 30 capsule 0    Sig: Take 1 capsule (300 mg total) by mouth at bedtime.   metFORMIN (GLUCOPHAGE-XR) 500 MG 24 hr tablet 120 tablet 2    Sig: Take 2 tablets (1,000 mg total) by mouth 2 (two) times daily with a meal.    Return in about 4 weeks (around 01/04/2021) for Follow-Up diabetic neuropathy .  Camillia Herter, NP

## 2020-12-07 ENCOUNTER — Other Ambulatory Visit: Payer: Self-pay

## 2020-12-07 ENCOUNTER — Ambulatory Visit (INDEPENDENT_AMBULATORY_CARE_PROVIDER_SITE_OTHER): Payer: 59 | Admitting: Family

## 2020-12-07 ENCOUNTER — Encounter: Payer: Self-pay | Admitting: Family

## 2020-12-07 VITALS — BP 130/87 | HR 67 | Temp 98.4°F | Resp 15 | Ht 74.49 in | Wt 255.2 lb

## 2020-12-07 DIAGNOSIS — E119 Type 2 diabetes mellitus without complications: Secondary | ICD-10-CM | POA: Diagnosis not present

## 2020-12-07 DIAGNOSIS — Z1329 Encounter for screening for other suspected endocrine disorder: Secondary | ICD-10-CM

## 2020-12-07 DIAGNOSIS — M5432 Sciatica, left side: Secondary | ICD-10-CM

## 2020-12-07 DIAGNOSIS — I10 Essential (primary) hypertension: Secondary | ICD-10-CM

## 2020-12-07 MED ORDER — METFORMIN HCL ER 500 MG PO TB24: 1000.0000 mg | ORAL_TABLET | Freq: Two times a day (BID) | ORAL | 2 refills | Status: DC

## 2020-12-07 MED ORDER — GABAPENTIN 300 MG PO CAPS: 300.0000 mg | ORAL_CAPSULE | Freq: Every day | ORAL | 0 refills | Status: DC

## 2020-12-07 MED ORDER — METFORMIN HCL ER 500 MG PO TB24
1000.0000 mg | ORAL_TABLET | Freq: Two times a day (BID) | ORAL | 2 refills | Status: DC
Start: 2020-12-07 — End: 2022-02-15

## 2020-12-07 NOTE — Progress Notes (Signed)
Pt presents for hypertension follow-up, pt need refill on glucose test trips Request that TSH is checked today, pt has concerns about autoimmune disorders  Experiencing nerve pain in left leg approx 4 months

## 2020-12-08 LAB — TSH: TSH: 1.24 u[IU]/mL (ref 0.450–4.500)

## 2020-12-08 NOTE — Progress Notes (Signed)
Thyroid function normal.

## 2020-12-14 ENCOUNTER — Other Ambulatory Visit: Payer: Self-pay

## 2020-12-14 ENCOUNTER — Ambulatory Visit (INDEPENDENT_AMBULATORY_CARE_PROVIDER_SITE_OTHER): Payer: 59 | Admitting: Licensed Clinical Social Worker

## 2020-12-14 DIAGNOSIS — F331 Major depressive disorder, recurrent, moderate: Secondary | ICD-10-CM | POA: Diagnosis not present

## 2020-12-16 ENCOUNTER — Telehealth: Payer: 59 | Admitting: Internal Medicine

## 2020-12-16 NOTE — Progress Notes (Signed)
   THERAPIST PROGRESS NOTE  Session Time: 55 min  Participation Level: Active  Behavioral Response: CasualAlertDepressed  Type of Therapy: Individual Therapy  Treatment Goals addressed: Communication: depression/loss/coping  Interventions: Supportive and Other: grief education/counseling  Summary: Jose Bishop is a 55 y.o. male who presents with hx of MDD. This date pt returns for in person session. He is walking slowly and affect is flat. LCSW assessed for status of [employer] Disability hearing. Pt states he was approved. He displays no pleasure or excitement in this ruling. Pt states "It will reduce the burden" speaking of finances but he does not yet know what his payment will be. Pt shifts conversation to his son. Remainder of session spent on situation with son who has recently been dx with schizophrenia and is refusing meds. Son is 23 yrs of age. Pt provides many details of behaviors and trying to help son. Pt's son is currently staying in his home. Pt shows video of son sitting outside on the deck twitching and talking to self while looking very dazed. Pt frustrated with not being able to get him adequate help. He is clearly grappling with the overwhelming shock that this is happening vs where son was expected to be when he left for college. LCSW provided information on resources, including [facility] residential tx and [place] for [employer]. Pt reports there is a current wait list for ACT. This situation is creating strain in the entire fam and pt's marriage. LCSW assisted pt to process thoughts/feelings. LCSW provided education on this being a loss with grief to be processed. Provided education on grief/loss. Provided grief literature. Pt reports he is taking his meds as prescribed. LCSW reviewed poc. Pt states appreciation for care.    Suicidal/Homicidal: Nowithout intent/plan  Therapist Response: Pt remains receptive to care.  Plan: Return again for next avail  appt  Diagnosis: Axis I:  MDD, moderate     Hermine Messick, LCSW 12/16/2020

## 2020-12-21 ENCOUNTER — Telehealth: Payer: 59 | Admitting: Family

## 2021-01-03 ENCOUNTER — Ambulatory Visit (INDEPENDENT_AMBULATORY_CARE_PROVIDER_SITE_OTHER): Payer: 59 | Admitting: Licensed Clinical Social Worker

## 2021-01-03 ENCOUNTER — Other Ambulatory Visit: Payer: Self-pay

## 2021-01-03 DIAGNOSIS — F331 Major depressive disorder, recurrent, moderate: Secondary | ICD-10-CM | POA: Diagnosis not present

## 2021-01-04 ENCOUNTER — Ambulatory Visit (INDEPENDENT_AMBULATORY_CARE_PROVIDER_SITE_OTHER): Payer: 59 | Admitting: Pharmacist

## 2021-01-04 VITALS — BP 112/76 | HR 79 | Resp 16 | Ht 74.5 in | Wt 254.0 lb

## 2021-01-04 DIAGNOSIS — Z79899 Other long term (current) drug therapy: Secondary | ICD-10-CM | POA: Diagnosis not present

## 2021-01-04 DIAGNOSIS — E782 Mixed hyperlipidemia: Secondary | ICD-10-CM

## 2021-01-04 DIAGNOSIS — I10 Essential (primary) hypertension: Secondary | ICD-10-CM | POA: Diagnosis not present

## 2021-01-04 MED ORDER — REPATHA PUSHTRONEX SYSTEM 420 MG/3.5ML ~~LOC~~ SOCT: 420.0000 mg | SUBCUTANEOUS | 0 refills | Status: DC

## 2021-01-04 NOTE — Progress Notes (Signed)
HPI:  Jose Bishop is a 55 y.o. male patient of Dr Gwenlyn Found, with a PMH below who presents today for hypertension and lipid follow up.   Recent labs showed a familial hyperlipidemia, with his LDL at 208 while on atorvastatin 80mg  plus ezetimibe 10mg  therapy. Repatha Pushtronex x 1 sample was provided in June until disability becomes active in July.  Atorvastatin 80mg  was discontinued due to myalgias and fatigue, but ezetimibe 10mg  daily was continued.  During last OV HCTZ was changed to chlorthalidone 25mg  daily to improve BP control.  Patient reports to follow up. Denies dizziness, headaches, swelling, or blurry vision. Reports sporadic chest discomfort with stressful situation, but resolves quickly without  additional intervention. Was able to tolerate Repatha Pushtronex last month, but disability insurance still pending.   Past Medical History: hyperlipidemia 4/22 LDL 208 - repatha Pushtronex 420mg   hypertension 143/92 at last OV - on hctz 25 mg, lisinopril 20 mg  Thoracic aortic aneurysm 42 mm ascending thoracic aorta by CTA 4/21  DM2 5/22 A1c 9.6 - on metformin, ozempic  TIA 2 times, 2003, 2205 - still has occasional foggy brain  Prostate cancer 2016 prostatectomy      Blood Pressure Goal:  130/80  Goal LDL < 70 (ideal < 55)  Current Medications:  Repatha Pushtronex 420mg , ezetimibe 10 mg   Lisinopril 20 mg daily - morning chlorthalidone 25 mg daily- morning  Family Hx: father lost both lower legs d/t gangrene ;carotid endarterecomy x 2 ; now 88; mother died from stroke at 50; brother with hypertension, DM. Sister with DM; 3 children [phone] - healthy  Social Hx: no tobacco,  occasional social alcohol; no coffee, some tea, has cut back on soda, now more water  Diet: eating mostly home cooked meals now  Exercise: PT at [facility] for shoulder, goes to [employer]  2-3 times per week - 5 miles on bike, or 10 treadmill, and shoulder exercises  Home BP readings:  home cuff NOT  accurate readings always higher than in MD office or [employer]  Intolerances: dapagliflozin, statins  Labs:  4/22: Na 138, K 4.8, Glu 262, BUN 15, SCr 1.24 GFR 69; A1c 9.6,TC 289, TG 142, HDL 55, LDL 208 (on atorvastatin 80, ezetimibe 10)   Wt Readings from Last 3 Encounters:  01/04/21 254 lb (115.2 kg)  12/07/20 255 lb 3.2 oz (115.8 kg)  11/26/20 260 lb (117.9 kg)   BP Readings from Last 3 Encounters:  01/04/21 112/76  12/07/20 130/87  11/26/20 140/90   Pulse Readings from Last 3 Encounters:  01/04/21 79  12/07/20 67  11/26/20 83    Current Outpatient Medications  Medication Sig Dispense Refill   chlorthalidone (HYGROTON) 25 MG tablet Take 1 tablet (25 mg total) by mouth daily. 30 tablet 3   DULoxetine (CYMBALTA) 30 MG capsule Take 1 tablet (30 mg total) by mouth once daily for 14 days. Then increase to 2 tablets (60 mg total) by mouth once daily as tolerated. 42 capsule 1   ezetimibe (ZETIA) 10 MG tablet Take 1 tablet (10 mg total) by mouth daily. 90 tablet 3   gabapentin (NEURONTIN) 300 MG capsule Take 1 capsule (300 mg total) by mouth at bedtime. 30 capsule 0   glucose blood test strip Use as instructed 100 each 12   latanoprost (XALATAN) 0.005 % ophthalmic solution 1 drop at bedtime.     lisinopril (ZESTRIL) 20 MG tablet Take 1 tablet (20 mg total) by mouth daily. 90 tablet 0  metFORMIN (GLUCOPHAGE-XR) 500 MG 24 hr tablet Take 2 tablets (1,000 mg total) by mouth 2 (two) times daily with a meal. 120 tablet 2   omeprazole (PRILOSEC) 40 MG capsule Take 1 capsule 30 minutes before breakfast 30 capsule 3   OZEMPIC, 0.25 OR 0.5 MG/DOSE, 2 MG/1.5ML SOPN Inject 1 mg into the skin once a week. 3 mL 2   Evolocumab with Infusor (REPATHA PUSHTRONEX SYSTEM) 420 MG/3.5ML SOCT Inject 420 mg into the skin every 30 (thirty) days. Infuse one applicator every 30 days 3.6 mL 0   No current facility-administered medications for this visit.    Allergies  Allergen Reactions   Bee Venom  Anaphylaxis   Dapagliflozin-Metformin Hcl Er    Lipitor [Atorvastatin]     FATIGUED NAUSEA BRAIN FOG    Past Medical History:  Diagnosis Date   AAA (abdominal aortic aneurysm) (HCC)    Bradycardia    Difficulty sleeping    Essential hypertension    Family history of stroke    Headache    History of transient ischemic attack (TIA)    15 YRS AGO   Hyperlipidemia    Impotence of organic origin    Liver cyst    Memory loss    Osteoarthritis of acromioclavicular joint    Prostate cancer ([place])    Type 2 diabetes mellitus with hyperglycemia (HCC)     Blood pressure 112/76, pulse 79, resp. rate 16, height 6' 2.5" (1.892 m), weight 254 lb (115.2 kg), SpO2 96 %.  Hyperlipidemia LDL above goal while on atorvastatin and ezetimibe. Last month Repatha Pushtronex 420mg  was give as sample while waiting for disability insurance approval. Patient disability still pending, but currently under [employer] coverage.   Will provide another sample of Reapath Pushtronex today(lot [id number] exp nov/30/23), and process PA under current insurance. Is able to get prior-authrization approve, patient should eb able to use co-pay card until disability is active. Plan to repeat fasting blood work in 2-3 weeks and follow up as needed.  Benign essential HTN Blood pressure at goal today. Patient was unable to monitor at home d/t inaccurate BP cuff. I recommended contacting EMS station close home  to see if they can monitor his BP 2-3 times per week.  Will continue current medication regimens as prescribed, and repeat BMEt in 2 weeks together with lipid panel. Plan to follow up in 5-6 weeks.   Tharun Cappella Rodriguez-Guzman PharmD, BCPS, [place] [id number] 01/05/2021 5:07 PM

## 2021-01-04 NOTE — Patient Instructions (Addendum)
Return for a  follow up appointment in 5 weeks  Go to the lab 3 weeks (FASTING)  Check your blood pressure at home daily (if able) and keep record of the readings.  Take your BP meds as follows:  *INCREASE HYDRATION* *NO MEDICATION CHANGE*  Bring all of your meds, your BP cuff and your record of home blood pressures to your next appointment.  Exercise as you're able, try to walk approximately 30 minutes per day.  Keep salt intake to a minimum, especially watch canned and prepared boxed foods.  Eat more fresh fruits and vegetables and fewer canned items.  Avoid eating in fast food restaurants.    HOW TO TAKE YOUR BLOOD PRESSURE: Rest 5 minutes before taking your blood pressure.  Don't smoke or drink caffeinated beverages for at least 30 minutes before. Take your blood pressure before (not after) you eat. Sit comfortably with your back supported and both feet on the floor (don't cross your legs). Elevate your arm to heart level on a table or a desk. Use the proper sized cuff. It should fit smoothly and snugly around your bare upper arm. There should be enough room to slip a fingertip under the cuff. The bottom edge of the cuff should be 1 inch above the crease of the elbow. Ideally, take 3 measurements at one sitting and record the average. 5

## 2021-01-05 ENCOUNTER — Encounter: Payer: Self-pay | Admitting: Pharmacist

## 2021-01-05 NOTE — Assessment & Plan Note (Addendum)
Blood pressure at goal today. Patient was unable to monitor at home d/t inaccurate BP cuff. I recommended contacting EMS station close home  to see if they can monitor his BP 2-3 times per week.  Will continue current medication regimens as prescribed, and repeat BMEt in 2 weeks together with lipid panel. Plan to follow up in 5-6 weeks.

## 2021-01-05 NOTE — Assessment & Plan Note (Addendum)
LDL above goal while on atorvastatin and ezetimibe. Last month Repatha Pushtronex 420mg  was give as sample while waiting for disability insurance approval. Patient disability still pending, but currently under [employer] coverage.   Will provide another sample of Reapath Pushtronex today(lot [id number] exp nov/30/23), and process PA under current insurance. Is able to get prior-authrization approve, patient should eb able to use co-pay card until disability is active. Plan to repeat fasting blood work in 2-3 weeks and follow up as needed.

## 2021-01-05 NOTE — Progress Notes (Signed)
   THERAPIST PROGRESS NOTE  Session Time: 45 min  Participation Level: Active  Behavioral Response: CasualAlertDepressed and Dysphoric  Type of Therapy: Individual Therapy  Treatment Goals addressed: Communication: depression, coping  Interventions: Solution Focused and Supportive  Summary: Jose Bishop is a 55 y.o. male who presents with hx of MDD. This date pt enters office with phone conversation on speaker phone going on with the mother of his 20 yr old son recently dx with schizophrenia. Pt encouraged/asked to resume call after session and does. Pt states "See this is what I am dealing with". Pt provides updates on situation with son who continues to live in his home. Son reportedly visiting his older step bro in [place] currently. Son's mother has filed for guardianship but papers cannot be served while son is away. Mother of son told him to stay another wk in [place]. Mother also trying to block son going into a 2 yr program in [place], [place], that pt found reportedly saying "I don't want him locked up that long". Pt very frustrated. Pt states on top of situation with his son he and wife's marital concerns have escalated to a new level. Pt provides many details and describes much jealousy r/t pt's communications with the mother of son who is living with them. Wife also not happy with son being in the home given some of his behaviors. Wife has recently talked about leaving. Assisted pt to process thoughts/feelings/options. He states wife is unhappy with the church marriage counselors and wants to see someone else. She has been encouraged to see someone individually as well but has not followed through. Pt reports he has not been able to talk to anyone at [facility] and their system is not allowing a vm. Pt states intent to go by their office while he is out today. When asked he advises he still does not know what his SS payment will be or when he will receive payment. Finances remains  a stressor. LCSW reviewed coping, poc including scheduling prior to close of session. Pt states appreciation for care.   Suicidal/Homicidal: Nowithout intent/plan  Therapist Response: Pt remains receptive to care.  Plan: Return again in ~4 weeks.  Diagnosis: Axis I:  MDD, moderate     Hermine Messick, LCSW 01/05/2021

## 2021-01-06 ENCOUNTER — Other Ambulatory Visit: Payer: Self-pay | Admitting: Family

## 2021-01-06 DIAGNOSIS — M5432 Sciatica, left side: Secondary | ICD-10-CM

## 2021-01-06 NOTE — Progress Notes (Signed)
Patient ID: Jose Bishop, male    DOB: May 16, 1966  MRN: [id number]  CC: Annual Physical Exam  Subjective: Jose Bishop is a 55 y.o. male who presents for annual physical exam.  His concerns today include:  Intermittently hands and fingers locking up related to cramping. Recently had to manually move cramping hand/fingers for repositioning with assistance of other hand. Endorses intermittent hand swelling.   Patient Active Problem List   Diagnosis Date Noted   Elevated blood pressure reading with diagnosis of hypertension 10/28/2020   Low back pain 09/27/2020   Neck pain 09/27/2020   DDD (degenerative disc disease), cervical 04/17/2020   Degeneration of lumbar intervertebral disc 04/17/2020   Type 2 diabetes mellitus without complication, with long-term current use of insulin ([place]) 10/06/2019   Benign essential HTN 10/06/2019   Thoracic aortic aneurysm ([place]) 09/16/2019   ED (erectile dysfunction) of organic origin 01/06/2019   History of repair of rotator cuff 04/16/2018   Hyperlipidemia 04/18/2017   Atypical chest pain 01/24/2017   Prostate cancer ([place]) 01/04/2015   History of recurrent TIAs 10/02/2014   Family history of cerebral aneurysm 10/02/2014     Current Outpatient Medications on File Prior to Visit  Medication Sig Dispense Refill   chlorthalidone (HYGROTON) 25 MG tablet Take 1 tablet (25 mg total) by mouth daily. 30 tablet 3   DULoxetine (CYMBALTA) 30 MG capsule Take 1 tablet (30 mg total) by mouth once daily for 14 days. Then increase to 2 tablets (60 mg total) by mouth once daily as tolerated. 42 capsule 1   Evolocumab with Infusor ([place]) 420 MG/3.5ML SOCT Inject 420 mg into the skin every 30 (thirty) days. Infuse one applicator every 30 days 3.6 mL 0   ezetimibe (ZETIA) 10 MG tablet Take 1 tablet (10 mg total) by mouth daily. 90 tablet 3   gabapentin (NEURONTIN) 300 MG capsule TAKE 1 CAPSULE(300 MG) BY MOUTH AT BEDTIME 30 capsule 0   glucose  blood test strip Use as instructed 100 each 12   latanoprost (XALATAN) 0.005 % ophthalmic solution 1 drop at bedtime.     lisinopril (ZESTRIL) 20 MG tablet Take 1 tablet (20 mg total) by mouth daily. 90 tablet 0   metFORMIN (GLUCOPHAGE-XR) 500 MG 24 hr tablet Take 2 tablets (1,000 mg total) by mouth 2 (two) times daily with a meal. 120 tablet 2   omeprazole (PRILOSEC) 40 MG capsule Take 1 capsule 30 minutes before breakfast 30 capsule 3   OZEMPIC, 0.25 OR 0.5 MG/DOSE, 2 MG/1.5ML SOPN Inject 1 mg into the skin once a week. 3 mL 2   No current facility-administered medications on file prior to visit.    Allergies  Allergen Reactions   Bee Venom Anaphylaxis   Dapagliflozin-Metformin Hcl Er    Lipitor [Atorvastatin]     FATIGUED NAUSEA BRAIN FOG    Social History   Socioeconomic History   Marital status: Married    Spouse name: Not on file   Number of children: 5   Years of education: Not on file   Highest education level: Not on file  Occupational History   Occupation: part time  Tobacco Use   Smoking status: Never   Smokeless tobacco: Never  Vaping Use   Vaping Use: Never used  Substance and Sexual Activity   Alcohol use: Yes    Alcohol/week: 0.0 standard drinks    Comment: OCCASIONAL   Drug use: No   Sexual activity: Yes    Partners: Female  Other Topics Concern   Not on file  Social History Narrative   Not on file   Social Determinants of Health   Financial Resource Strain: Not on file  Food Insecurity: Not on file  Transportation Needs: Not on file  Physical Activity: Not on file  Stress: Not on file  Social Connections: Not on file  Intimate Partner Violence: Not on file    Family History  Problem Relation Age of Onset   Anuerysm Mother    Stroke Mother    Hypertension Father    Peripheral vascular disease Father    Stroke Father    Colon cancer Father        91's   Diabetes Sister    Heart murmur Sister    Heart murmur Son     Past Surgical  History:  Procedure Laterality Date   HERNIA REPAIR     X2 ( 1 ING HERNIA / 1 UMBILICAL HERNIA )   LYMPHADENECTOMY Bilateral 01/04/2015   Procedure: BILATERAL LYMPHADENECTOMY;  Surgeon: Raynelle Bring, MD;  Location: [facility];  Service: Urology;  Laterality: Bilateral;   MASS EXCISION     L UPPER ARM   PENILE PROSTHESIS IMPLANT  03/18/2019   ROBOT ASSISTED LAPAROSCOPIC RADICAL PROSTATECTOMY N/A 01/04/2015   Procedure: ROBOTIC ASSISTED LAPAROSCOPIC RADICAL PROSTATECTOMY LEVEL 2;  Surgeon: Raynelle Bring, MD;  Location: [facility];  Service: Urology;  Laterality: N/A;   ROTATOR CUFF REPAIR Bilateral    SURGERY SCROTAL / TESTICULAR      ROS: Review of Systems Negative except as stated above  PHYSICAL EXAM: BP 103/77 (BP Location: Left Arm, Patient Position: Sitting, Cuff Size: Large)   Pulse 84   Temp (!) 97.1 F (36.2 C)   Resp 17   Ht 6' 2.49" (1.892 m)   Wt 250 lb (113.4 kg)   SpO2 97%   BMI 31.68 kg/m   Physical Exam HENT:     Head: Normocephalic and atraumatic.     Right Ear: Tympanic membrane, ear canal and external ear normal.     Left Ear: Tympanic membrane, ear canal and external ear normal.  Eyes:     Extraocular Movements: Extraocular movements intact.     Conjunctiva/sclera: Conjunctivae normal.     Pupils: Pupils are equal, round, and reactive to light.  Cardiovascular:     Rate and Rhythm: Normal rate and regular rhythm.  Pulmonary:     Effort: Pulmonary effort is normal.     Breath sounds: Normal breath sounds.  Abdominal:     General: Bowel sounds are normal.     Palpations: Abdomen is soft.  Genitourinary:    Comments: Patient declined exam. Musculoskeletal:        General: Normal range of motion.     Cervical back: Normal range of motion and neck supple.  Skin:    General: Skin is warm and dry.     Capillary Refill: Capillary refill takes less than 2 seconds.  Neurological:     General: No focal deficit present.     Mental Status: He is alert and  oriented to person, place, and time.  Psychiatric:        Mood and Affect: Mood normal.        Behavior: Behavior normal.   ASSESSMENT AND PLAN: 1. Annual physical exam: - Counseled on 150 minutes of exercise per week as tolerated, healthy eating (including decreased daily intake of saturated fats, cholesterol, added sugars, sodium), STI prevention, and routine healthcare maintenance.  2. Screening  for metabolic disorder: - CMP last obtained 10/22/2020. - Magnesium to screen for deficiency.  - Magnesium  3. Screening for deficiency anemia: - CBC to screen for anemia. - CBC  4. Thyroid disorder screen: - TSH last obtained 12/07/2020.  5. Type 2 diabetes mellitus without complication, without long-term current use of insulin ([place]): - Keep appointment scheduled with Renato Shin, MD at Endocrinology on 03/08/2021.  6. Mixed hyperlipidemia: - Lipid panel last obtained 10/22/2020. - Patient followed by Lipid Clinic.   7. Screening for colon cancer: - Referral to Gastroenterology for colon cancer screening by colonoscopy. - Ambulatory referral to Gastroenterology   Patient was given the opportunity to ask questions.  Patient verbalized understanding of the plan and was able to repeat key elements of the plan. Patient was given clear instructions to go to Emergency Department or return to medical center if symptoms don't improve, worsen, or new problems develop.The patient verbalized understanding.   Orders Placed This Encounter  Procedures   CBC   Magnesium   Ambulatory referral to Gastroenterology    Follow-up with primary provider as scheduled.   Camillia Herter, NP

## 2021-01-07 ENCOUNTER — Ambulatory Visit (INDEPENDENT_AMBULATORY_CARE_PROVIDER_SITE_OTHER): Payer: 59 | Admitting: Family

## 2021-01-07 ENCOUNTER — Other Ambulatory Visit: Payer: Self-pay

## 2021-01-07 VITALS — BP 103/77 | HR 84 | Temp 97.1°F | Resp 17 | Ht 74.49 in | Wt 250.0 lb

## 2021-01-07 DIAGNOSIS — Z130 Encounter for screening for diseases of the blood and blood-forming organs and certain disorders involving the immune mechanism: Secondary | ICD-10-CM

## 2021-01-07 DIAGNOSIS — Z0001 Encounter for general adult medical examination with abnormal findings: Secondary | ICD-10-CM | POA: Diagnosis not present

## 2021-01-07 DIAGNOSIS — Z1329 Encounter for screening for other suspected endocrine disorder: Secondary | ICD-10-CM

## 2021-01-07 DIAGNOSIS — E782 Mixed hyperlipidemia: Secondary | ICD-10-CM | POA: Diagnosis not present

## 2021-01-07 DIAGNOSIS — E119 Type 2 diabetes mellitus without complications: Secondary | ICD-10-CM

## 2021-01-07 DIAGNOSIS — Z1211 Encounter for screening for malignant neoplasm of colon: Secondary | ICD-10-CM

## 2021-01-07 DIAGNOSIS — Z0000 Encounter for general adult medical examination without abnormal findings: Secondary | ICD-10-CM

## 2021-01-07 DIAGNOSIS — Z13228 Encounter for screening for other metabolic disorders: Secondary | ICD-10-CM

## 2021-01-07 NOTE — Progress Notes (Signed)
.  Pt presents for annual physical exam  

## 2021-01-08 LAB — CBC
Hematocrit: 42.9 % (ref 37.5–51.0)
Hemoglobin: 14.2 g/dL (ref 13.0–17.7)
MCH: 30.2 pg (ref 26.6–33.0)
MCHC: 33.1 g/dL (ref 31.5–35.7)
MCV: 91 fL (ref 79–97)
Platelets: 352 10*3/uL (ref 150–450)
RBC: 4.7 x10E6/uL (ref 4.14–5.80)
RDW: 12.8 % (ref 11.6–15.4)
WBC: 5 10*3/uL (ref 3.4–10.8)

## 2021-01-08 LAB — MAGNESIUM: Magnesium: 2.1 mg/dL (ref 1.6–2.3)

## 2021-01-08 NOTE — Progress Notes (Signed)
Kidney function normal.   Magnesium normal.

## 2021-01-10 ENCOUNTER — Telehealth: Payer: Self-pay

## 2021-01-10 DIAGNOSIS — E782 Mixed hyperlipidemia: Secondary | ICD-10-CM

## 2021-01-10 MED ORDER — REPATHA PUSHTRONEX SYSTEM 420 MG/3.5ML ~~LOC~~ SOCT: 420.0000 mg | SUBCUTANEOUS | 11 refills | Status: DC

## 2021-01-10 NOTE — Telephone Encounter (Signed)
Called and spoke w/pt that they are approved for repatha pushtronex and rx sen. Instructed pt to complete fasting labs post 2nd dose and they voiced understanding that no appt is needed

## 2021-01-29 ENCOUNTER — Encounter: Payer: Self-pay | Admitting: Gastroenterology

## 2021-01-29 LAB — LIPID PANEL
Chol/HDL Ratio: 2 ratio (ref 0.0–5.0)
Cholesterol, Total: 125 mg/dL (ref 100–199)
HDL: 64 mg/dL (ref >39)
LDL Chol Calc (NIH): 45 mg/dL (ref 0–99)
Triglycerides: 81 mg/dL (ref 0–149)
VLDL Cholesterol Cal: 16 mg/dL (ref 5–40)

## 2021-01-29 LAB — HEPATIC FUNCTION PANEL
ALT: 20 IU/L (ref 0–44)
AST: 21 IU/L (ref 0–40)
Albumin: 4.8 g/dL (ref 3.8–4.9)
Alkaline Phosphatase: 55 IU/L (ref 44–121)
Bilirubin Total: 0.8 mg/dL (ref 0.0–1.2)
Bilirubin, Direct: 0.26 mg/dL (ref 0.00–0.40)
Total Protein: 7.8 g/dL (ref 6.0–8.5)

## 2021-01-29 LAB — BASIC METABOLIC PANEL
BUN/Creatinine Ratio: 12 (ref 9–20)
BUN: 14 mg/dL (ref 6–24)
CO2: 22 mmol/L (ref 20–29)
Calcium: 9.7 mg/dL (ref 8.7–10.2)
Chloride: 97 mmol/L (ref 96–106)
Creatinine, Ser: 1.21 mg/dL (ref 0.76–1.27)
Glucose: 158 mg/dL — ABNORMAL HIGH (ref 65–99)
Potassium: 4.7 mmol/L (ref 3.5–5.2)
Sodium: 135 mmol/L (ref 134–144)
eGFR: 71 mL/min/{1.73_m2} (ref >59)

## 2021-01-29 LAB — HEMOGLOBIN A1C
Est. average glucose Bld gHb Est-mCnc: 226 mg/dL
Hgb A1c MFr Bld: 9.5 % — ABNORMAL HIGH (ref 4.8–5.6)

## 2021-02-01 ENCOUNTER — Telehealth: Payer: Self-pay

## 2021-02-01 ENCOUNTER — Other Ambulatory Visit: Payer: Self-pay

## 2021-02-01 ENCOUNTER — Ambulatory Visit (INDEPENDENT_AMBULATORY_CARE_PROVIDER_SITE_OTHER): Payer: Medicare Other | Admitting: Pharmacist Clinician (PhC)/ Clinical Pharmacy Specialist

## 2021-02-01 DIAGNOSIS — I10 Essential (primary) hypertension: Secondary | ICD-10-CM

## 2021-02-01 DIAGNOSIS — E782 Mixed hyperlipidemia: Secondary | ICD-10-CM

## 2021-02-01 NOTE — Assessment & Plan Note (Signed)
Patient with familial hyperlipidemia, showing excellent results after just two doses of Repatha 420 mg.  Will have Haleigh reach out to his pharmacy and see what the delay is in getting the prescription filled.  He should continue with this and monitor his lipid labs every 6-12 months.

## 2021-02-01 NOTE — Progress Notes (Signed)
HPI:  Jose Bishop is a 55 y.o. male patient of Dr Gwenlyn Found, with a PMH below who presents today for hypertension and lipid follow up.  Patient has history of familial hyperlipidemia, with an LDL at 208 while on atorvastatin 80 and ezetimibe 10.  We stopped the atorvastatin (myalgias and fatigue) and instead started him on Repatha 420 mg Pushtronix in addition to the ezetimibe.  We also recently switched his hydrochlorothiazide to chlorthalidone for improved BP control.  Last month his blood pressure was well controlled at 112/76 and several days later 103/77 at his PCP.  He moved the lisinopril to evenings to prevent late morning drops in his pressure.    Today he returns for a follow up.  He has done 2 doses of the Repatha Pushtronix, and his LDL cholesterol dropped from 208 to 45.  We have a prior authorization that was approved through his insurance company, however the pharmacy states it still needs to be done. At his last visit his BP was stable, 112/76 and no medication changes were made.  Today he reports feeling well with no side effect or concerns.  We reviewed his recent labs, and while he was happy with the drop in LDL, he was frustrated at the lack of drop in A1c.  He states has been working hard for several months on his diet, has eliminated most fruits and cut back significantly on carbohydrates.  He feels like he has nothing left to eliminate food-wise.  Assured him that he should continue with the positive changes he has been making.    Past Medical History: hyperlipidemia 4/22 LDL 208 - repatha Pushtronex '420mg'$   hypertension 143/92 at last OV - on hctz 25 mg, lisinopril 20 mg  Thoracic aortic aneurysm 42 mm ascending thoracic aorta by CTA 4/21  DM2 8/22 A1c 9.5 - on metformin, ozempic (9.6 in May)  TIA 2 times, 2003, 2205 - still has occasional foggy brain  Prostate cancer 2016 prostatectomy      Blood Pressure Goal:  130/80  Goal LDL < 70 (ideal < 55)  Current  Medications:  Repatha Pushtronex '420mg'$ , ezetimibe 10 mg   Lisinopril 20 mg daily - evening chlorthalidone 25 mg daily- morning  Family Hx: father lost both lower legs d/t gangrene ;carotid endarterecomy x 2 ; now 72; mother died from stroke at 18; brother with hypertension, DM. Sister with DM; 3 children [phone] - healthy  Social Hx: no tobacco,  occasional social alcohol; no coffee or tea, has cut back on soda, now more water  Diet: eating mostly home cooked meals now; baked fish, shrimp, salmon, talapia, chicken; more steamed vegetables; eating oatmeal and boiled eggs for breakfast, limits pasta and rice to once weekly.  feels as though he has done everything possible to control the DM as far as diet is concerned.    Exercise:  Works out at [employer], swimming; walking most days'   Home BP readings: home cuff was found to be INACCURATE   Intolerances: dapagliflozin, statins  Labs: 8/22:  Na 135,  K 4.7, Glu 158, BUN 14, SCr 1.21 GFR 71, A1c 9.5, TG 81, HDL 64, LDL 45 (on Repatha 420, ezetimibe 10) 4/22: Na 138, K 4.8, Glu 262, BUN 15, SCr 1.24 GFR 69; A1c 9.6,TC 289, TG 142, HDL 55, LDL 208 (on atorvastatin 80, ezetimibe 10)   Wt Readings from Last 3 Encounters:  02/01/21 255 lb (115.7 kg)  01/07/21 250 lb (113.4 kg)  01/04/21 254 lb (115.2  kg)   BP Readings from Last 3 Encounters:  02/01/21 (!) 136/98  01/07/21 103/77  01/04/21 112/76   Pulse Readings from Last 3 Encounters:  02/01/21 60  01/07/21 84  01/04/21 79    Current Outpatient Medications  Medication Sig Dispense Refill   APPLE CIDER VINEGAR PO Take by mouth.     chlorthalidone (HYGROTON) 25 MG tablet Take 1 tablet (25 mg total) by mouth daily. 30 tablet 3   DULoxetine (CYMBALTA) 30 MG capsule Take 1 tablet (30 mg total) by mouth once daily for 14 days. Then increase to 2 tablets (60 mg total) by mouth once daily as tolerated. 42 capsule 1   Evolocumab with Infusor ([place]) 420 MG/3.5ML SOCT Inject  420 mg into the skin every 30 (thirty) days. Infuse one applicator every 30 days 3.6 mL 11   ezetimibe (ZETIA) 10 MG tablet Take 1 tablet (10 mg total) by mouth daily. 90 tablet 3   gabapentin (NEURONTIN) 300 MG capsule TAKE 1 CAPSULE(300 MG) BY MOUTH AT BEDTIME 30 capsule 0   glucose blood test strip Use as instructed 100 each 12   latanoprost (XALATAN) 0.005 % ophthalmic solution 1 drop at bedtime.     lisinopril (ZESTRIL) 20 MG tablet Take 1 tablet (20 mg total) by mouth daily. 90 tablet 0   metFORMIN (GLUCOPHAGE-XR) 500 MG 24 hr tablet Take 2 tablets (1,000 mg total) by mouth 2 (two) times daily with a meal. 120 tablet 2   Nutritional Supplements (KATE [place]ARMS GLUCOSE SUPPORT 1.2 PO) Take 3 tablets by mouth 2 (two) times daily.     omeprazole (PRILOSEC) 40 MG capsule Take 1 capsule 30 minutes before breakfast 30 capsule 3   OZEMPIC, 0.25 OR 0.5 MG/DOSE, 2 MG/1.5ML SOPN Inject 1 mg into the skin once a week. 3 mL 2   No current facility-administered medications for this visit.    Allergies  Allergen Reactions   Bee Venom Anaphylaxis   Dapagliflozin-Metformin Hcl Er    Lipitor [Atorvastatin]     FATIGUED NAUSEA BRAIN FOG    Past Medical History:  Diagnosis Date   AAA (abdominal aortic aneurysm) (HCC)    Bradycardia    Difficulty sleeping    Essential hypertension    Family history of stroke    Headache    History of transient ischemic attack (TIA)    15 YRS AGO   Hyperlipidemia    Impotence of organic origin    Liver cyst    Memory loss    Osteoarthritis of acromioclavicular joint    Prostate cancer ([place])    Type 2 diabetes mellitus with hyperglycemia (HCC)     Blood pressure (!) 136/98, pulse 60, resp. rate 14, height '6\' 3"'$  (1.905 m), weight 255 lb (115.7 kg), SpO2 99 %.  Elevated blood pressure reading with diagnosis of hypertension Patient with essential hypertension, showing a diastolic elevation in the office today.  He is frustrated with his blood sugars more than  anything.  Today I advised that we will continue with current medications and give his recent changes in diet and increased exercise some time to show benefit.  He should continue monitoring home readings and will reach out to our office should they not remain close to goal.  He is needing to purchase a new BP device, and I invited him to contact us for a time to bring it by the office for calibration once he can purchase.    Hyperlipidemia Patient with familial hyperlipidemia, showing excellent  results after just two doses of Repatha 420 mg.  Will have Haleigh reach out to his pharmacy and see what the delay is in getting the prescription filled.  He should continue with this and monitor his lipid labs every 6-12 months.     Tommy Medal PharmD CPP [facility] [address] [place] [id number] 02/01/2021 3:25 PM

## 2021-02-01 NOTE — Telephone Encounter (Signed)
I called and spoke w/pt regarding the approval of repatha and stated that I called the pharmacy and they stated that they only reason they couldn't get it was because they didn't have it in stock. I was also able to get them approved for a copay card and emailed to the pt as follows: RxBin: [id number] [place]: CN RxGrp: [id number] ID: [id number]

## 2021-02-01 NOTE — Assessment & Plan Note (Signed)
Patient with essential hypertension, showing a diastolic elevation in the office today.  He is frustrated with his blood sugars more than anything.  Today I advised that we will continue with current medications and give his recent changes in diet and increased exercise some time to show benefit.  He should continue monitoring home readings and will reach out to our office should they not remain close to goal.  He is needing to purchase a new BP device, and I invited him to contact us for a time to bring it by the office for calibration once he can purchase.

## 2021-02-01 NOTE — Patient Instructions (Signed)
  Check your blood pressure at home 2-3 times per week and keep record of the readings.  Take your BP meds as follows:  Continue with your current medications  If you have any problems or concerns please reach out to [place] at [phone]  Bring all of your meds, your BP cuff and your record of home blood pressures to your next appointment.  Exercise as you're able, try to walk approximately 30 minutes per day.  Keep salt intake to a minimum, especially watch canned and prepared boxed foods.  Eat more fresh fruits and vegetables and fewer canned items.  Avoid eating in fast food restaurants.    HOW TO TAKE YOUR BLOOD PRESSURE: Rest 5 minutes before taking your blood pressure.  Don't smoke or drink caffeinated beverages for at least 30 minutes before. Take your blood pressure before (not after) you eat. Sit comfortably with your back supported and both feet on the floor (don't cross your legs). Elevate your arm to heart level on a table or a desk. Use the proper sized cuff. It should fit smoothly and snugly around your bare upper arm. There should be enough room to slip a fingertip under the cuff. The bottom edge of the cuff should be 1 inch above the crease of the elbow. Ideally, take 3 measurements at one sitting and record the average.

## 2021-02-01 NOTE — Telephone Encounter (Signed)
-----   Message from Rockne Menghini, [place] sent at 02/01/2021  3:07 PM EDT ----- Regarding: Repatha PA Hi  Can you call the pharmacy and see what the problem is with the [place]?  Spread sheet indicates it's an approved med, but the tech told me that it needed PA.  Then she put me on hold for 10  minutes to ask someone why...  :(  Thank you!

## 2021-02-01 NOTE — Telephone Encounter (Signed)
Called and spoke w/pharmacy tech regarding the once monthly repatha to figure out what is going on with it and they stated that it was just not in stock.

## 2021-02-04 ENCOUNTER — Encounter: Payer: Self-pay | Admitting: *Deleted

## 2021-02-08 ENCOUNTER — Ambulatory Visit (INDEPENDENT_AMBULATORY_CARE_PROVIDER_SITE_OTHER): Payer: Medicare Other | Admitting: Licensed Clinical Social Worker

## 2021-02-08 ENCOUNTER — Other Ambulatory Visit: Payer: Self-pay

## 2021-02-08 DIAGNOSIS — F331 Major depressive disorder, recurrent, moderate: Secondary | ICD-10-CM | POA: Diagnosis not present

## 2021-02-10 NOTE — Progress Notes (Signed)
   THERAPIST PROGRESS NOTE   Virtual Visit via Video Note  I connected with Jose Bishop on 02/08/21 at 10:00 AM EDT by a video enabled telemedicine application and verified that I am speaking with the correct person using two identifiers.  Location: Patient: In his car in store parking lot Provider: [facility]   I discussed the limitations of evaluation and management by telemedicine and the availability of in person appointments. The patient expressed understanding and agreed to proceed. I discussed the assessment and treatment plan with the patient. The patient was provided an opportunity to ask questions and all were answered. The patient agreed with the plan and demonstrated an understanding of the instructions.  I provided 45 minutes of non-face-to-face time during this encounter.  Participation Level: Active  Behavioral Response: CasualAlertDepressed  Type of Therapy: Individual Therapy  Treatment Goals addressed: Communication: Dep/coping  Interventions: Solution Focused and Supportive  Summary: Jose Bishop is a 55 y.o. male who presents with hx of MDD. This date pt has video session. Upon signing on pt is noted to be in his car driving. LCSW advised session would not be conducted with him operating a motor vehicle. Pt verbalizes understanding and pulls off the road into a parking lot. Once vehicle is parked LCSW continues session. Pt reports he is managing adequately at this time. He has some ongoing stressors r/t son. He states he continued to have push back from mother of son r/t guardianship and care plans for son. Son would not voluntarily enter any of the programs pt found for him. Pt told son's mother she would have to come get him since she is not being cooperative with a care plan and did not complete guardianship. Pt states son has been out of his home and back with mother ~2 wks and mother of child will not answer calls or texts. Asked if pt has considered filing for  guardianship and he reports he has not. Pt reports his marriage is a bit better and provides details. He states his dtr is leaving to return to college tomorrow and they will be the only ones in the house, which he anticipates to be a help. Also reports relief with finances d/t disability coming through. Pt has gotten back pay and is getting monthly income. He reports this is a "relief". He states he is getting ready to meet with SS rep to see what he is allowed to do for additional work/income. Pt states he had a mini vacation to the beach last weekend and the trip was a much needed break from all the stress he has been under. Pt denies new worries/concerns. He is taking meds as prescribed. LCSW reviewed poc including scheduling prior to close of session. Pt states appreciation for care.   Suicidal/Homicidal: Nowithout intent/plan  Therapist Response: Pt remains receptive to care.  Plan: Return again in ~2 weeks.  Diagnosis: Axis I:  MDD, moderate     Hermine Messick, LCSW 02/10/2021

## 2021-02-13 ENCOUNTER — Other Ambulatory Visit: Payer: Self-pay | Admitting: Family

## 2021-02-13 DIAGNOSIS — E119 Type 2 diabetes mellitus without complications: Secondary | ICD-10-CM

## 2021-02-18 ENCOUNTER — Other Ambulatory Visit: Payer: Self-pay | Admitting: Family

## 2021-02-18 DIAGNOSIS — F332 Major depressive disorder, recurrent severe without psychotic features: Secondary | ICD-10-CM

## 2021-02-18 DIAGNOSIS — M7918 Myalgia, other site: Secondary | ICD-10-CM

## 2021-02-21 ENCOUNTER — Ambulatory Visit (INDEPENDENT_AMBULATORY_CARE_PROVIDER_SITE_OTHER): Payer: Medicare Other | Admitting: Licensed Clinical Social Worker

## 2021-02-21 ENCOUNTER — Other Ambulatory Visit: Payer: Self-pay

## 2021-02-21 DIAGNOSIS — F330 Major depressive disorder, recurrent, mild: Secondary | ICD-10-CM | POA: Diagnosis not present

## 2021-02-21 NOTE — Telephone Encounter (Signed)
Patient encouraged to keep appointment scheduled 03/08/2021 with Renato Shin, MD at Endocrinology.

## 2021-02-21 NOTE — Telephone Encounter (Signed)
Patient encouraged to keep appointment with Renato Shin, MD at Endocrinology.

## 2021-02-22 ENCOUNTER — Other Ambulatory Visit: Payer: Self-pay | Admitting: Family

## 2021-02-22 DIAGNOSIS — I10 Essential (primary) hypertension: Secondary | ICD-10-CM

## 2021-02-23 NOTE — Progress Notes (Signed)
   THERAPIST PROGRESS NOTE  Session Time: 45 min  Participation Level: Active  Behavioral Response: CasualAlertDepressed  Type of Therapy: Individual Therapy  Treatment Goals addressed: Communication: depression/coping  Interventions: Solution Focused and Supportive  Summary: Jose Bishop is a 55 y.o. male who presents with hx of MDD. This date pt returns for in person session. Pt advises his oldest aunt died 66. Pt reports on all he has been doing r/t dealing with the death. He is essentially coordinating everything and helping her 6 yr old husband despite there being an adult child, pt's cousin. In exploration of fam dynamics pt states "I'm head of the family". He states he is always the one to see that things go as they should. Services are pending. Pt reports he has still not talked to the mother of his son dx with schizophrenia. He has called to talk to son and thinks he is about the same. Assessment of status with spouse reveals pt saying things are "better". He reports they are getting along and talking. Pt's wife wants them to have couples counseling with her brother who is a Theme park manager. Pt reports "If that's what she wants to do I'm going to do it". He states he gets along fine with bil and wants to support wife's wishes. Pt advises his dep is "3" on a 0-10 scale. He is taking meds as prescribed. Pt states he is less stressed knowing he has a steady income. He has not yet met with SS to see what he can to work wise to supplement. Pt has Medicare now as his disability went retro to 2019. Pt states he has joined the [facility] and is going 3-4 x wk to exercise in the pool. Pt advises he is trying to get his physical health on track. He has improved cholesterol but last A1C reportedly 9.3. LCSW assessed for pt having a diabetes educator. He reports he is seeing endocrinologist in Sep and will ask if there is one in the practice. Made referral to Diabetes Association Website. Reviewed coping strategies  and provided encouragement for pt's exercise and health goals. LCSW reviewed poc including scheduling prior to close of session. Pt states appreciation for care.   Suicidal/Homicidal: Nowithout intent/plan  Therapist Response: Pt remains receptive to care.  Plan: Return again in ~4 weeks.  Diagnosis: Axis I:  MDD, mild  Hermine Messick, LCSW 02/23/2021

## 2021-03-03 ENCOUNTER — Other Ambulatory Visit: Payer: Self-pay

## 2021-03-03 ENCOUNTER — Ambulatory Visit: Payer: PPO | Admitting: Physician Assistant

## 2021-03-03 VITALS — BP 135/87 | HR 61 | Temp 98.2°F | Resp 20 | Ht 75.0 in | Wt 250.0 lb

## 2021-03-03 DIAGNOSIS — I10 Essential (primary) hypertension: Secondary | ICD-10-CM | POA: Diagnosis not present

## 2021-03-03 DIAGNOSIS — Z794 Long term (current) use of insulin: Secondary | ICD-10-CM

## 2021-03-03 DIAGNOSIS — R110 Nausea: Secondary | ICD-10-CM

## 2021-03-03 DIAGNOSIS — E119 Type 2 diabetes mellitus without complications: Secondary | ICD-10-CM | POA: Diagnosis not present

## 2021-03-03 NOTE — Progress Notes (Signed)
Established Patient Office Visit  Subjective:  Patient ID: Jose Bishop, male    DOB: 08/29/1965  Age: 55 y.o. MRN: [id number]  CC:  Chief Complaint  Patient presents with   Nausea     HPI Jose Bishop reports that he has had feelings of nausea and dizziness over the last couple of days.  Reports that he has been having fluctuations in his blood pressure readings.  Reports that he has been taking his chlorthalidone in the morning and his lisinopril at bedtime.  Reports in the last couple days he has moved the lisinopril to morning time along with the chlorthalidone because his blood pressure readings have been elevated in the morning.  Reports that he has only been taking 500 mg of metformin once a day, states that he will have 2 or 3 episodes of loose bowel movements after taking that dosing, states that if he takes any more metformin he would be unable to leave the house due to multiple bowel movements.  Reports that he has been checking his blood glucose levels at home, states he will have some readings of 117, other readings in the upper 200s.  States that he does have an upcoming appointment with endocrinology for further review.   Past Medical History:  Diagnosis Date   AAA (abdominal aortic aneurysm) (HCC)    Bradycardia    Difficulty sleeping    Essential hypertension    Family history of stroke    Headache    History of transient ischemic attack (TIA)    15 YRS AGO   Hyperlipidemia    Impotence of organic origin    Liver cyst    Memory loss    Osteoarthritis of acromioclavicular joint    Prostate cancer ([place])    Type 2 diabetes mellitus with hyperglycemia ([place])     Past Surgical History:  Procedure Laterality Date   HERNIA REPAIR     X2 ( 1 ING HERNIA / 1 UMBILICAL HERNIA )   LYMPHADENECTOMY Bilateral 01/04/2015   Procedure: BILATERAL LYMPHADENECTOMY;  Surgeon: Raynelle Bring, MD;  Location: [facility];  Service: Urology;  Laterality: Bilateral;   MASS EXCISION      L UPPER ARM   PENILE PROSTHESIS IMPLANT  03/18/2019   ROBOT ASSISTED LAPAROSCOPIC RADICAL PROSTATECTOMY N/A 01/04/2015   Procedure: ROBOTIC ASSISTED LAPAROSCOPIC RADICAL PROSTATECTOMY LEVEL 2;  Surgeon: Raynelle Bring, MD;  Location: [facility];  Service: Urology;  Laterality: N/A;   ROTATOR CUFF REPAIR Bilateral    SURGERY SCROTAL / TESTICULAR      Family History  Problem Relation Age of Onset   Anuerysm Mother    Stroke Mother    Hypertension Father    Peripheral vascular disease Father    Stroke Father    Colon cancer Father        43's   Diabetes Sister    Heart murmur Sister    Heart murmur Son     Social History   Socioeconomic History   Marital status: Married    Spouse name: Not on file   Number of children: 5   Years of education: Not on file   Highest education level: Not on file  Occupational History   Occupation: part time  Tobacco Use   Smoking status: Never   Smokeless tobacco: Never  Vaping Use   Vaping Use: Never used  Substance and Sexual Activity   Alcohol use: Yes    Alcohol/week: 0.0 standard drinks    Comment: OCCASIONAL  Drug use: No   Sexual activity: Yes    Partners: Female  Other Topics Concern   Not on file  Social History Narrative   Not on file   Social Determinants of Health   Financial Resource Strain: Not on file  Food Insecurity: Not on file  Transportation Needs: Not on file  Physical Activity: Not on file  Stress: Not on file  Social Connections: Not on file  Intimate Partner Violence: Not on file    Outpatient Medications Prior to Visit  Medication Sig Dispense Refill   APPLE CIDER VINEGAR PO Take by mouth.     chlorthalidone (HYGROTON) 25 MG tablet Take 1 tablet (25 mg total) by mouth daily. 30 tablet 3   Evolocumab with Infusor (Mutual) 420 MG/3.5ML SOCT Inject 420 mg into the skin every 30 (thirty) days. Infuse one applicator every 30 days 3.6 mL 11   ezetimibe (ZETIA) 10 MG tablet Take 1 tablet (10  mg total) by mouth daily. 90 tablet 3   gabapentin (NEURONTIN) 300 MG capsule TAKE 1 CAPSULE(300 MG) BY MOUTH AT BEDTIME 30 capsule 0   glucose blood test strip Use as instructed 100 each 12   latanoprost (XALATAN) 0.005 % ophthalmic solution 1 drop at bedtime.     lisinopril (ZESTRIL) 20 MG tablet TAKE 1 TABLET(20 MG) BY MOUTH DAILY 30 tablet 0   metFORMIN (GLUCOPHAGE-XR) 500 MG 24 hr tablet Take 2 tablets (1,000 mg total) by mouth 2 (two) times daily with a meal. 120 tablet 2   Nutritional Supplements (KATE [place]ARMS GLUCOSE SUPPORT 1.2 PO) Take 3 tablets by mouth 2 (two) times daily.     omeprazole (PRILOSEC) 40 MG capsule Take 1 capsule 30 minutes before breakfast 30 capsule 3   OZEMPIC, 1 MG/DOSE, 4 MG/3ML SOPN INJECT 1 MG UNDER THE SKIN ONCE A WEEK 3 mL 0   DULoxetine (CYMBALTA) 30 MG capsule TAKE 1 CAPSULE(30 MG) BY MOUTH EVERY DAY FOR 14 DAYS. INCREASE TO 2 TABLETS 60 MG TOTAL BY MOUTH EVERY DAY AS TOLERATED 42 capsule 0   No facility-administered medications prior to visit.    Allergies  Allergen Reactions   Bee Venom Anaphylaxis   Dapagliflozin-Metformin Hcl Er    Lipitor [Atorvastatin]     FATIGUED NAUSEA BRAIN FOG    ROS Review of Systems  Constitutional:  Negative for chills and fever.  HENT: Negative.    Eyes: Negative.   Respiratory:  Negative for shortness of breath.   Cardiovascular:  Negative for chest pain.  Gastrointestinal:  Positive for nausea. Negative for vomiting.  Endocrine: Negative.   Genitourinary: Negative.   Musculoskeletal: Negative.   Allergic/Immunologic: Negative.   Neurological:  Positive for dizziness. Negative for syncope, speech difficulty, weakness and headaches.  Hematological: Negative.   Psychiatric/Behavioral: Negative.       Objective:    Physical Exam Vitals and nursing note reviewed.  Constitutional:      Appearance: Normal appearance.  HENT:     Head: Normocephalic and atraumatic.     Right Ear: External ear normal.     Left  Ear: External ear normal.     Nose: Nose normal.     Mouth/Throat:     Mouth: Mucous membranes are moist.     Pharynx: Oropharynx is clear.  Eyes:     Extraocular Movements: Extraocular movements intact.     Conjunctiva/sclera: Conjunctivae normal.     Pupils: Pupils are equal, round, and reactive to light.  Cardiovascular:  Rate and Rhythm: Normal rate and regular rhythm.     Pulses: Normal pulses.     Heart sounds: Normal heart sounds.  Pulmonary:     Effort: Pulmonary effort is normal.     Breath sounds: Normal breath sounds.  Musculoskeletal:        General: Normal range of motion.     Cervical back: Normal range of motion and neck supple.  Skin:    General: Skin is warm and dry.  Neurological:     General: No focal deficit present.     Mental Status: He is alert and oriented to person, place, and time.  Psychiatric:        Mood and Affect: Mood normal.        Behavior: Behavior normal.        Thought Content: Thought content normal.        Judgment: Judgment normal.    BP 135/87 (BP Location: Left Arm, Patient Position: Sitting, Cuff Size: Large)   Pulse 61   Temp 98.2 F (36.8 C) (Oral)   Resp 20   Ht 6' 3"  (1.905 m)   Wt 250 lb (113.4 kg)   SpO2 99%   BMI 31.25 kg/m  Wt Readings from Last 3 Encounters:  03/03/21 250 lb (113.4 kg)  02/01/21 255 lb (115.7 kg)  01/07/21 250 lb (113.4 kg)     Health Maintenance Due  Topic Date Due   Zoster Vaccines- Shingrix (1 of 2) Never done   COVID-19 Vaccine (3 - Pfizer risk series) 10/18/2019   COLONOSCOPY (Pts 45-22yr Insurance coverage will need to be confirmed)  08/08/2020   Pneumococcal Vaccine 050660Years old (2 - PCV) 01/05/2021   FOOT EXAM  01/05/2021   INFLUENZA VACCINE  Never done    There are no preventive care reminders to display for this patient.  Lab Results  Component Value Date   TSH 1.240 12/07/2020   Lab Results  Component Value Date   WBC 5.0 01/07/2021   HGB 14.2 01/07/2021   HCT 42.9  01/07/2021   MCV 91 01/07/2021   PLT 352 01/07/2021   Lab Results  Component Value Date   NA 135 01/28/2021   K 4.7 01/28/2021   CO2 22 01/28/2021   GLUCOSE 158 (H) 01/28/2021   BUN 14 01/28/2021   CREATININE 1.21 01/28/2021   BILITOT 0.8 01/28/2021   ALKPHOS 55 01/28/2021   AST 21 01/28/2021   ALT 20 01/28/2021   PROT 7.8 01/28/2021   ALBUMIN 4.8 01/28/2021   CALCIUM 9.7 01/28/2021   ANIONGAP 12 04/30/2019   EGFR 71 01/28/2021   Lab Results  Component Value Date   CHOL 125 01/28/2021   Lab Results  Component Value Date   HDL 64 01/28/2021   Lab Results  Component Value Date   LDLCALC 45 01/28/2021   Lab Results  Component Value Date   TRIG 81 01/28/2021   Lab Results  Component Value Date   CHOLHDL 2.0 01/28/2021   Lab Results  Component Value Date   HGBA1C 9.5 (H) 01/28/2021      Assessment & Plan:   Problem List Items Addressed This Visit       Cardiovascular and Mediastinum   Benign essential HTN     Endocrine   Type 2 diabetes mellitus without complication, with long-term current use of insulin ([place]   Other Visit Diagnoses     Nausea    -  Primary       No orders  of the defined types were placed in this encounter.  1. Nausea Encourage patient to take medication as scheduled instead of taking lisinopril again in the morning if his blood pressure readings are elevated.  Continue to take blood pressure readings at home, keep a written log and have available for all office visits.  Red flags given for prompt reevaluation  2. Benign essential HTN   3. Type 2 diabetes mellitus without complication, with long-term current use of insulin [facility]) Patient encouraged to continue checking blood glucose levels at home, keep a written log and have available for office visit upcoming with endocrinology.  Will defer to endocrinology for medication changes due to adverse effects from metformin.  Patient understands and agrees    I have reviewed the  patient's medical history (PMH, PSH, Social History, Family History, Medications, and allergies) , and have been updated if relevant. I spent 30 minutes reviewing chart and  face to face time with patient.   Follow-up: Return if symptoms worsen or fail to improve.    Loraine Grip Mayers, PA-C

## 2021-03-03 NOTE — Patient Instructions (Signed)
I encourage you to keep your blood pressure medications consistent, taking one of them in the morning and one of them at bedtime.  I encourage you to check your blood pressure in the morning, keep a written log and have available for all of your office visits.  I encourage you to check your blood glucose levels in the morning and before bed, keep a written log and take those with you to your endocrinology appointment next week.  Please let us know if there is anything else we can do for you.  Kennieth Rad, PA-C Physician Assistant [facility] [email or web address]  Hypotension As your heart beats, it forces blood through your body. Hypotension, commonly called low blood pressure, is when the force of blood pumping through your arteries is too weak. Arteries are blood vessels that carry blood from the heart throughout the body. Depending on the cause and severity, hypotension may be harmless (benign) or may cause serious problems (be critical). When blood pressure is too low, you may not get enough blood to your brain or to the rest of your organs. This can cause weakness, light-headedness, rapid heartbeat, and fainting. What are the causes? This condition may be caused by: Blood loss. Loss of body fluids (dehydration). Heart problems. Hormone (endocrine) problems. Pregnancy. Severe infection. Lack of certain nutrients. Severe allergic reactions (anaphylaxis). Certain medicines, such as blood pressure medicine or medicines that make the body lose excess fluids (diuretics). Sometimes, hypotension may be caused by not taking medicine as directed, such as taking too much of a certain medicine. What increases the risk? The following factors may make you more likely to develop this condition: Age. Risk increases as you get older. Conditions that affect the heart or the central nervous system. Taking certain medicines, such as blood  pressure medicine or diuretics. Being pregnant. What are the signs or symptoms? Common symptoms of this condition include: Weakness. Light-headedness. Dizziness. Blurred vision. Fatigue. Rapid heartbeat. Fainting, in severe cases. How is this diagnosed? This condition is diagnosed based on: Your medical history. Your symptoms. Your blood pressure measurement. Your health care provider will check your blood pressure when you are: Lying down. Sitting. Standing. A blood pressure reading is recorded as two numbers, such as "120 over 80" (or 120/80). The first ("top") number is called the systolic pressure. It is a measure of the pressure in your arteries as your heart beats. The second ("bottom") number is called the diastolic pressure. It is a measure of the pressure in your arteries when your heart relaxes between beats. Blood pressure is measured in a unit called mm Hg. Healthy blood pressure for most adults is 120/80. If your blood pressure is below 90/60, you may be diagnosed with hypotension. Other information or tests that may be used to diagnose hypotension include: Your other vital signs, such as your heart rate and temperature. Blood tests. Tilt table test. For this test, you will be safely secured to a table that moves you from a lying position to an upright position. Your heart rhythm and blood pressure will be monitored during the test. How is this treated? Treatment for this condition may include: Changing your diet. This may involve eating more salt (sodium) or drinking more water. Taking medicines to raise your blood pressure. Changing the dosage of certain medicines you are taking that might be lowering your blood pressure. Wearing compression stockings. These stockings help to prevent blood clots and reduce swelling in your legs. In some cases, you may need  to go to the hospital for: Fluid replacement. This means you will receive fluids through an IV. Blood replacement.  This means you will receive donated blood through an IV (transfusion). Treating an infection or heart problems, if this applies. Monitoring. You may need to be monitored while medicines that you are taking wear off. Follow these instructions at home: Eating and drinking  Drink enough fluid to keep your urine pale yellow. Eat a healthy diet, and follow instructions from your health care provider about eating or drinking restrictions. A healthy diet includes: Fresh fruits and vegetables. Whole grains. Lean meats. Low-fat dairy products. Eat extra salt only as directed. Do not add extra salt to your diet unless your health care provider told you to do that. Eat frequent, small meals. Avoid standing up suddenly after eating. Medicines Take over-the-counter and prescription medicines only as told by your health care provider. Follow instructions from your health care provider about changing the dosage of your current medicines, if this applies. Do not stop or adjust any of your medicines on your own. General instructions  Wear compression stockings as told by your health care provider. Get up slowly from lying down or sitting positions. This gives your blood pressure a chance to adjust. Avoid hot showers and excessive heat as directed by your health care provider. Return to your normal activities as told by your health care provider. Ask your health care provider what activities are safe for you. Do not use any products that contain nicotine or tobacco, such as cigarettes, e-cigarettes, and chewing tobacco. If you need help quitting, ask your health care provider. Keep all follow-up visits as told by your health care provider. This is important. Contact a health care provider if you: Vomit. Have diarrhea. Have a fever for more than 2-3 days. Feel more thirsty than usual. Feel weak and tired. Get help right away if you: Have chest pain. Have a fast or irregular heartbeat. Develop  numbness in any part of your body. Cannot move your arms or your legs. Have trouble speaking. Become sweaty or feel light-headed. Faint. Feel short of breath. Have trouble staying awake. Feel confused. Summary Hypotension is when the force of blood pumping through your arteries is too weak. Hypotension may be harmless (benign) or may cause serious problems (be critical). Treatment for this condition may include changing your diet, changing your medicines, and wearing compression stockings. In some cases, you may need to go to the hospital for fluid or blood replacement. This information is not intended to replace advice given to you by your health care provider. Make sure you discuss any questions you have with your health care provider. Document Revised: 12/06/2017 Document Reviewed: 12/06/2017 Elsevier Patient Education  [place].

## 2021-03-03 NOTE — Progress Notes (Signed)
Patient has not eaten today and patient has taken medication. Patient reports low normal BP readings when taking both BP medications together. Patient reports low HR between 40-60 when taking both BP medications together.

## 2021-03-08 ENCOUNTER — Ambulatory Visit: Payer: 59 | Admitting: Endocrinology

## 2021-04-01 ENCOUNTER — Ambulatory Visit (HOSPITAL_COMMUNITY): Payer: 59 | Admitting: Licensed Clinical Social Worker

## 2021-04-01 ENCOUNTER — Telehealth: Payer: Self-pay | Admitting: Pharmacist

## 2021-04-01 DIAGNOSIS — I10 Essential (primary) hypertension: Secondary | ICD-10-CM

## 2021-04-01 DIAGNOSIS — E119 Type 2 diabetes mellitus without complications: Secondary | ICD-10-CM

## 2021-04-01 MED ORDER — CHLORTHALIDONE 25 MG PO TABS: 25.0000 mg | ORAL_TABLET | Freq: Every day | ORAL | 3 refills | Status: DC

## 2021-04-01 MED ORDER — GLUCOSE BLOOD VI STRP: ORAL_STRIP | 12 refills | Status: DC

## 2021-04-01 NOTE — Telephone Encounter (Signed)
Patient called requesting refill of chlorthalidone and test strips

## 2021-04-18 ENCOUNTER — Ambulatory Visit (INDEPENDENT_AMBULATORY_CARE_PROVIDER_SITE_OTHER): Payer: PPO | Admitting: Endocrinology

## 2021-04-18 ENCOUNTER — Other Ambulatory Visit: Payer: Self-pay

## 2021-04-18 VITALS — BP 110/60 | HR 86 | Ht 75.0 in | Wt 253.0 lb

## 2021-04-18 DIAGNOSIS — Z794 Long term (current) use of insulin: Secondary | ICD-10-CM

## 2021-04-18 DIAGNOSIS — E119 Type 2 diabetes mellitus without complications: Secondary | ICD-10-CM | POA: Diagnosis not present

## 2021-04-18 LAB — POCT GLYCOSYLATED HEMOGLOBIN (HGB A1C): Hemoglobin A1C: 9.2 % — AB (ref 4.0–5.6)

## 2021-04-18 MED ORDER — GLIMEPIRIDE 2 MG PO TABS: 2.0000 mg | ORAL_TABLET | Freq: Every day | ORAL | 3 refills | Status: DC

## 2021-04-18 NOTE — Patient Instructions (Addendum)
good diet and exercise significantly improve the control of your diabetes.  please let me know if you wish to be referred to a dietician.  high blood sugar is very risky to your health.  you should see an eye doctor and dentist every year.  It is very important to get all recommended vaccinations.  Controlling your blood pressure and cholesterol drastically reduces the damage diabetes does to your body.  Those who smoke should quit.  Please discuss these with your doctor.  check your blood sugar once a day.  vary the time of day when you check, between before the 3 meals, and at bedtime.  also check if you have symptoms of your blood sugar being too high or too low.  please keep a record of the readings and bring it to your next appointment here (or you can bring the meter itself).  You can write it on any piece of paper.  please call us sooner if your blood sugar goes below 70, or if most of your readings are over 200. We will need to take this complex situation in stages.   I have sent a prescription to your pharmacy, to add glimepiride, and:   Please continue the same other 2 medications.   Please come back for a follow-up appointment in 3 months.   Here are some diabetic DME Suppliers, to get your test strips.     [email or web address] [phone] Fax [phone]  [email or web address] [phone] Fax [phone]  [email or web address] [phone] ext [phone] Fax [phone]  [email or web address] [phone] Fax [phone]  [email or web address] [phone] Fax [phone]  [email or web address] [phone] Fax [phone]  [email or web address] [phone] Fax [phone]  [email or web address] [phone] Fax [email or web address] [phone] option #1 Fax  [phone]  [email or web address] [phone] Fax [phone]  [place] [facility] [phone] Fax [phone]  [place] [email or web address] [phone] Fax [phone]

## 2021-04-18 NOTE — Progress Notes (Signed)
Subjective:    Patient ID: Jose Bishop, male    DOB: Oct 20, 1965, 55 y.o.   MRN: [id number]  HPI pt is referred by Durene Fruits, NP for diabetes.  Pt states DM was dx'ed in 9892; it is complicated by TIA; he has never been on insulin; pt says his diet and exercise are improved recently; he has never had pancreatitis, pancreatic surgery, severe hypoglycemia or DKA.  He is disabled due to back probs.  He takes Ozempic and metformin.  Pt says cbg varies from 130-264.   Past Medical History:  Diagnosis Date   AAA (abdominal aortic aneurysm) (HCC)    Bradycardia    Difficulty sleeping    Essential hypertension    Family history of stroke    Headache    History of transient ischemic attack (TIA)    15 YRS AGO   Hyperlipidemia    Impotence of organic origin    Liver cyst    Memory loss    Osteoarthritis of acromioclavicular joint    Prostate cancer ([place])    Type 2 diabetes mellitus with hyperglycemia ([place])     Past Surgical History:  Procedure Laterality Date   HERNIA REPAIR     X2 ( 1 ING HERNIA / 1 UMBILICAL HERNIA )   LYMPHADENECTOMY Bilateral 01/04/2015   Procedure: BILATERAL LYMPHADENECTOMY;  Surgeon: Raynelle Bring, MD;  Location: [facility];  Service: Urology;  Laterality: Bilateral;   MASS EXCISION     L UPPER ARM   PENILE PROSTHESIS IMPLANT  03/18/2019   ROBOT ASSISTED LAPAROSCOPIC RADICAL PROSTATECTOMY N/A 01/04/2015   Procedure: ROBOTIC ASSISTED LAPAROSCOPIC RADICAL PROSTATECTOMY LEVEL 2;  Surgeon: Raynelle Bring, MD;  Location: [facility];  Service: Urology;  Laterality: N/A;   ROTATOR CUFF REPAIR Bilateral    SURGERY SCROTAL / TESTICULAR      Social History   Socioeconomic History   Marital status: Married    Spouse name: Not on file   Number of children: 5   Years of education: Not on file   Highest education level: Not on file  Occupational History   Occupation: part time  Tobacco Use   Smoking status: Never   Smokeless tobacco: Never  Vaping Use   Vaping Use:  Never used  Substance and Sexual Activity   Alcohol use: Yes    Alcohol/week: 0.0 standard drinks    Comment: OCCASIONAL   Drug use: No   Sexual activity: Yes    Partners: Female  Other Topics Concern   Not on file  Social History Narrative   Not on file   Social Determinants of Health   Financial Resource Strain: Not on file  Food Insecurity: Not on file  Transportation Needs: Not on file  Physical Activity: Not on file  Stress: Not on file  Social Connections: Not on file  Intimate Partner Violence: Not on file    Current Outpatient Medications on File Prior to Visit  Medication Sig Dispense Refill   APPLE CIDER VINEGAR PO Take by mouth.     chlorthalidone (HYGROTON) 25 MG tablet Take 1 tablet (25 mg total) by mouth daily. 30 tablet 3   Evolocumab with Infusor ([place]) 420 MG/3.5ML SOCT Inject 420 mg into the skin every 30 (thirty) days. Infuse one applicator every 30 days 3.6 mL 11   ezetimibe (ZETIA) 10 MG tablet Take 1 tablet (10 mg total) by mouth daily. 90 tablet 3   gabapentin (NEURONTIN) 300 MG capsule TAKE 1 CAPSULE(300 MG) BY MOUTH  AT BEDTIME 30 capsule 0   glucose blood test strip Use as instructed 100 each 12   latanoprost (XALATAN) 0.005 % ophthalmic solution 1 drop at bedtime.     lisinopril (ZESTRIL) 20 MG tablet TAKE 1 TABLET(20 MG) BY MOUTH DAILY 30 tablet 0   Nutritional Supplements (KATE FARMS GLUCOSE SUPPORT 1.2 PO) Take 3 tablets by mouth 2 (two) times daily.     omeprazole (PRILOSEC) 40 MG capsule Take 1 capsule 30 minutes before breakfast 30 capsule 3   OZEMPIC, 1 MG/DOSE, 4 MG/3ML SOPN INJECT 1 MG UNDER THE SKIN ONCE A WEEK 3 mL 0   metFORMIN (GLUCOPHAGE-XR) 500 MG 24 hr tablet Take 2 tablets (1,000 mg total) by mouth 2 (two) times daily with a meal. 120 tablet 2   No current facility-administered medications on file prior to visit.    Allergies  Allergen Reactions   Bee Venom Anaphylaxis   Dapagliflozin-Metformin Hcl Er     Lipitor [Atorvastatin]     FATIGUED NAUSEA BRAIN FOG    Family History  Problem Relation Age of Onset   Anuerysm Mother    Stroke Mother    Hypertension Father    Peripheral vascular disease Father    Stroke Father    Colon cancer Father        62's   Diabetes Sister    Heart murmur Sister    Heart murmur Son     BP 110/60 (BP Location: Right Arm, Patient Position: Sitting, Cuff Size: Large)   Pulse 86   Ht 6\' 3"  (1.905 m)   Wt 253 lb (114.8 kg)   SpO2 98%   BMI 31.62 kg/m     Review of Systems denies sob, n/v, and memory loss.  He has lost 20 lbs x 2 years--unintentional.  He has intermitt HB.      Objective:   Physical Exam Pulses: dorsalis pedis intact bilat.   MSK: no deformity of the feet CV: no leg edema Skin:  no ulcer on the feet.  normal color and temp on the feet.   Neuro: sensation is intact to touch on the feet.    Lab Results  Component Value Date   HGBA1C 9.2 (A) 04/18/2021   Lab Results  Component Value Date   CREATININE 1.21 01/28/2021   BUN 14 01/28/2021   NA 135 01/28/2021   K 4.7 01/28/2021   CL 97 01/28/2021   CO2 22 01/28/2021   I have reviewed outside records, and summarized: Pt was noted to have elevated A1c, and referred here.  She was also seen in ER for HTN, and meds were adjusted.      Assessment & Plan:  Type 2 DM: uncontrolled.    Patient Instructions  good diet and exercise significantly improve the control of your diabetes.  please let me know if you wish to be referred to a dietician.  high blood sugar is very risky to your health.  you should see an eye doctor and dentist every year.  It is very important to get all recommended vaccinations.  Controlling your blood pressure and cholesterol drastically reduces the damage diabetes does to your body.  Those who smoke should quit.  Please discuss these with your doctor.  check your blood sugar once a day.  vary the time of day when you check, between before the 3 meals, and at  bedtime.  also check if you have symptoms of your blood sugar being too high or too low.  please keep  a record of the readings and bring it to your next appointment here (or you can bring the meter itself).  You can write it on any piece of paper.  please call us sooner if your blood sugar goes below 70, or if most of your readings are over 200. We will need to take this complex situation in stages.   I have sent a prescription to your pharmacy, to add glimepiride, and:   Please continue the same other 2 medications.   Please come back for a follow-up appointment in 3 months.   Here are some diabetic DME Suppliers, to get your test strips.     [email or web address] [phone] Fax [phone]  [email or web address] [phone] Fax [phone]  [email or web address] [phone] ext [phone] Fax [phone]  [place] [phone] Fax [phone]  [email or web address] [phone] Fax [phone]  [email or web address].com [phone] Fax [phone]  [facility] [email or web address] [phone] Fax [phone]  [email or web address] [phone] Fax [email or web address] [phone] option #1 Fax [phone]  [email or web address] [phone] Fax [phone]  [place] [facility] [phone] Fax [phone]  [email or web address] [phone] Fax [phone]

## 2021-04-19 ENCOUNTER — Other Ambulatory Visit: Payer: Self-pay

## 2021-04-19 ENCOUNTER — Ambulatory Visit: Payer: PPO | Admitting: Nurse Practitioner

## 2021-04-19 VITALS — BP 145/90 | HR 68 | Resp 18

## 2021-04-19 DIAGNOSIS — J0100 Acute maxillary sinusitis, unspecified: Secondary | ICD-10-CM

## 2021-04-19 MED ORDER — AMOXICILLIN-POT CLAVULANATE 875-125 MG PO TABS: 1.0000 | ORAL_TABLET | Freq: Two times a day (BID) | ORAL | 0 refills | Status: DC

## 2021-04-19 MED ORDER — FLUTICASONE PROPIONATE 50 MCG/ACT NA SUSP: 2.0000 | Freq: Every day | NASAL | 6 refills | Status: DC

## 2021-04-19 MED ORDER — CETIRIZINE HCL 10 MG PO TABS: 10.0000 mg | ORAL_TABLET | Freq: Every day | ORAL | 0 refills | Status: DC

## 2021-04-19 MED ORDER — PREDNISONE 10 MG PO TABS: 10.0000 mg | ORAL_TABLET | Freq: Every day | ORAL | 0 refills | Status: AC

## 2021-04-19 NOTE — Patient Instructions (Addendum)
Sinusitis Cough:   Stay well hydrated  Stay active  Deep breathing exercises  May take tylenol or fever or pain  May take mucinex twice daily  Will order Augmentin  Will order prednisone  Will order Flonase  Will order zyrtec    Follow up:  Follow up in 2 weeks or sooner if needed

## 2021-04-19 NOTE — Progress Notes (Signed)
@Patient  ID: Jose Bishop, male    DOB: 1966-06-01, 55 y.o.   MRN: [id number]  Chief Complaint  Patient presents with   Sinus Problem     Referring provider: Camillia Herter, NP   HPI  Patient presents today for sinus issues.  Patient states that he has been having sinus pressure and pain, postnasal drip.  He has been taking over-the-counter cold and sinus medications.  He also complains of a cough today.     Allergies  Allergen Reactions   Bee Venom Anaphylaxis   Dapagliflozin-Metformin Hcl Er    Lipitor [Atorvastatin]     FATIGUED NAUSEA BRAIN FOG    Immunization History  Administered Date(s) Administered   PFIZER(Purple Top)SARS-COV-2 Vaccination 08/30/2019, 09/20/2019   Pneumococcal Polysaccharide-23 01/06/2020    Past Medical History:  Diagnosis Date   AAA (abdominal aortic aneurysm)    Bradycardia    Difficulty sleeping    Essential hypertension    Family history of stroke    Headache    History of transient ischemic attack (TIA)    15 YRS AGO   Hyperlipidemia    Impotence of organic origin    Liver cyst    Memory loss    Osteoarthritis of acromioclavicular joint    Prostate cancer ([place])    Type 2 diabetes mellitus with hyperglycemia (HCC)     Tobacco History: Social History   Tobacco Use  Smoking Status Never  Smokeless Tobacco Never   Counseling given: Yes   Outpatient Encounter Medications as of 04/19/2021  Medication Sig   amoxicillin-clavulanate (AUGMENTIN) 875-125 MG tablet Take 1 tablet by mouth 2 (two) times daily.   cetirizine (ZYRTEC) 10 MG tablet Take 1 tablet (10 mg total) by mouth daily.   fluticasone (FLONASE) 50 MCG/ACT nasal spray Place 2 sprays into both nostrils daily.   [EXPIRED] predniSONE (DELTASONE) 10 MG tablet Take 1 tablet (10 mg total) by mouth daily with breakfast for 5 days.   APPLE CIDER VINEGAR PO Take by mouth.   chlorthalidone (HYGROTON) 25 MG tablet Take 1 tablet (25 mg total) by mouth daily.   Evolocumab  with Infusor ([place]) 420 MG/3.5ML SOCT Inject 420 mg into the skin every 30 (thirty) days. Infuse one applicator every 30 days   ezetimibe (ZETIA) 10 MG tablet Take 1 tablet (10 mg total) by mouth daily.   gabapentin (NEURONTIN) 300 MG capsule TAKE 1 CAPSULE(300 MG) BY MOUTH AT BEDTIME   glimepiride (AMARYL) 2 MG tablet Take 1 tablet (2 mg total) by mouth daily with breakfast.   glucose blood test strip Use as instructed   latanoprost (XALATAN) 0.005 % ophthalmic solution 1 drop at bedtime.   lisinopril (ZESTRIL) 20 MG tablet TAKE 1 TABLET(20 MG) BY MOUTH DAILY   metFORMIN (GLUCOPHAGE-XR) 500 MG 24 hr tablet Take 2 tablets (1,000 mg total) by mouth 2 (two) times daily with a meal.   Nutritional Supplements (KATE FARMS GLUCOSE SUPPORT 1.2 PO) Take 3 tablets by mouth 2 (two) times daily.   omeprazole (PRILOSEC) 40 MG capsule Take 1 capsule 30 minutes before breakfast   OZEMPIC, 1 MG/DOSE, 4 MG/3ML SOPN INJECT 1 MG UNDER THE SKIN ONCE A WEEK   No facility-administered encounter medications on file as of 04/19/2021.     Review of Systems  Review of Systems  Constitutional: Negative.   HENT:  Positive for congestion, postnasal drip, sinus pressure and sinus pain.   Respiratory:  Positive for cough.   Cardiovascular: Negative.   Gastrointestinal:  Negative.   Allergic/Immunologic: Negative.   Neurological: Negative.   Psychiatric/Behavioral: Negative.        Physical Exam  BP (!) 145/90   Pulse 68   Resp 18   SpO2 96%   Wt Readings from Last 5 Encounters:  04/18/21 253 lb (114.8 kg)  03/03/21 250 lb (113.4 kg)  02/01/21 255 lb (115.7 kg)  01/07/21 250 lb (113.4 kg)  01/04/21 254 lb (115.2 kg)     Physical Exam Vitals and nursing note reviewed.  Constitutional:      General: He is not in acute distress.    Appearance: He is well-developed.  HENT:     Nose: Congestion present.  Cardiovascular:     Rate and Rhythm: Normal rate and regular rhythm.   Pulmonary:     Effort: Pulmonary effort is normal.     Breath sounds: Normal breath sounds.  Skin:    General: Skin is warm and dry.  Neurological:     Mental Status: He is alert and oriented to person, place, and time.     Lab Results:  CBC    Component Value Date/Time   WBC 5.0 01/07/2021 1602   WBC 4.4 04/30/2019 1424   RBC 4.70 01/07/2021 1602   RBC 4.80 04/30/2019 1424   HGB 14.2 01/07/2021 1602   HCT 42.9 01/07/2021 1602   PLT 352 01/07/2021 1602   MCV 91 01/07/2021 1602   MCH 30.2 01/07/2021 1602   MCH 30.4 04/30/2019 1424   MCHC 33.1 01/07/2021 1602   MCHC 32.7 04/30/2019 1424   RDW 12.8 01/07/2021 1602   LYMPHSABS 2.5 11/16/2014 0735   MONOABS 0.5 11/16/2014 0735   EOSABS 0.4 11/16/2014 0735   BASOSABS 0.0 11/16/2014 0735    BMET    Component Value Date/Time   NA 135 01/28/2021 1432   K 4.7 01/28/2021 1432   CL 97 01/28/2021 1432   CO2 22 01/28/2021 1432   GLUCOSE 158 (H) 01/28/2021 1432   GLUCOSE 142 (H) 04/30/2019 1424   BUN 14 01/28/2021 1432   CREATININE 1.21 01/28/2021 1432   CREATININE 1.03 10/02/2014 1343   CALCIUM 9.7 01/28/2021 1432   GFRNONAA 83 04/14/2020 1024   GFRAA 95 04/14/2020 1024    BNP No results found for: BNP  ProBNP No results found for: PROBNP  Imaging: No results found.   Assessment & Plan:   Acute non-recurrent maxillary sinusitis Stay well hydrated  Stay active  Deep breathing exercises  May take tylenol or fever or pain  May take mucinex twice daily  Will order Augmentin  Will order prednisone  Will order Flonase  Will order zyrtec    Follow up:  Follow up in 2 weeks or sooner if needed     Fenton Foy, NP 05/04/2021

## 2021-04-22 ENCOUNTER — Encounter: Payer: Self-pay | Admitting: Nurse Practitioner

## 2021-04-29 ENCOUNTER — Ambulatory Visit (INDEPENDENT_AMBULATORY_CARE_PROVIDER_SITE_OTHER): Payer: PPO | Admitting: Licensed Clinical Social Worker

## 2021-04-29 DIAGNOSIS — F330 Major depressive disorder, recurrent, mild: Secondary | ICD-10-CM

## 2021-04-30 NOTE — Plan of Care (Signed)
Depression TX plan

## 2021-04-30 NOTE — Progress Notes (Signed)
   THERAPIST PROGRESS NOTE   Virtual Visit via Video Note  I connected with Jose Bishop on 04/29/21 at 10:00 AM EDT by a video enabled telemedicine application and verified that I am speaking with the correct person using two identifiers.  Location: Patient: [facility] Provider: Home   I discussed the limitations of evaluation and management by telemedicine and the availability of in person appointments. The patient expressed understanding and agreed to proceed. I discussed the assessment and treatment plan with the patient. The patient was provided an opportunity to ask questions and all were answered. The patient agreed with the plan and demonstrated an understanding of the instructions.   The patient was advised to call back or seek an in-person evaluation if the symptoms worsen or if the condition fails to improve as anticipated.  I provided 35 minutes of non-face-to-face time during this encounter.  Participation Level: Active  Behavioral Response: Casual and NeatAlertDepressed  Type of Therapy: Individual Therapy  Treatment Goals addressed: Communication: dep/coping  Interventions: Solution Focused and Supportive  Summary: Jose Bishop is a 55 y.o. male who presents with hx of MDD. This date pt comes in person for virtual session. He is placed in a private rm by [employer] staff and session is conducted with pt using his phone, LCSW sends link. Gap in care addressed. Pt last seen 8/29. Pt reports he is overall doing fairly well at present. He states he is "very frustrated" with the limitations of working while getting disability. He reports he met with a SS rep and went over the amount of income he is able to earn while receiving benefits. Pt goes into detail about how unrealistic he believes it is to find a job where he can only earn under $1350 per mon. He states he had [employer] and his meds and medical bills have gone up since being  on Medicare. LCSW provided education on open  enrollment for Medicare and made referral to [facility] for pt to get the best plan possible for his given situation. Pt states he is otherwise doing well. He is taking meds as prescribed. Pt reports his son is on shots now for his schizophrenia and he is pleased with how he is doing. Pt states son's mother is still not speaking to him and son remains living with his mother. Pt picks son up to spend time with him. Dtr doing well in school and will be home with him for holiday break. Pt advises he just got back from [place] with his wife. They celebrated their 20th wedding Maryruth Hancock and renewed there vows in a ceremony on [employer]. Pt provides details of the trip and states they are doing well. He states spouse told him she is going to do her best to leave the past in the past and start a new beginning. Pt very hopeful this will be the case. They did not do counseling with spouse's bro per pt. Pt advises he is going to the [facility] almost daily to assist with physical and mental health. Pt denies new worries/concerns. LCSW reviewed poc including scheduling prior to close of session. Pt states appreciation for care.    Suicidal/Homicidal: Nowithout intent/plan  Therapist Response: Pt receptive to care.  Plan: Return again for next avail appt.  Diagnosis: Axis I:  MDD, mild   Hermine Messick, LCSW 04/30/2021

## 2021-05-04 ENCOUNTER — Encounter: Payer: Self-pay | Admitting: Nurse Practitioner

## 2021-05-04 DIAGNOSIS — J0100 Acute maxillary sinusitis, unspecified: Secondary | ICD-10-CM | POA: Insufficient documentation

## 2021-05-04 NOTE — Assessment & Plan Note (Signed)
Stay well hydrated  Stay active  Deep breathing exercises  May take tylenol or fever or pain  May take mucinex twice daily  Will order Augmentin  Will order prednisone  Will order Flonase  Will order zyrtec    Follow up:  Follow up in 2 weeks or sooner if needed

## 2021-05-25 ENCOUNTER — Telehealth: Payer: Self-pay | Admitting: Family

## 2021-05-25 ENCOUNTER — Ambulatory Visit: Payer: PPO | Attending: Family Medicine | Admitting: Family Medicine

## 2021-05-25 ENCOUNTER — Encounter: Payer: Self-pay | Admitting: Family Medicine

## 2021-05-25 ENCOUNTER — Other Ambulatory Visit: Payer: Self-pay

## 2021-05-25 VITALS — BP 155/95 | HR 60 | Ht 75.0 in | Wt 263.0 lb

## 2021-05-25 DIAGNOSIS — M542 Cervicalgia: Secondary | ICD-10-CM

## 2021-05-25 DIAGNOSIS — Z794 Long term (current) use of insulin: Secondary | ICD-10-CM | POA: Diagnosis not present

## 2021-05-25 DIAGNOSIS — E1165 Type 2 diabetes mellitus with hyperglycemia: Secondary | ICD-10-CM

## 2021-05-25 DIAGNOSIS — E1159 Type 2 diabetes mellitus with other circulatory complications: Secondary | ICD-10-CM | POA: Diagnosis not present

## 2021-05-25 DIAGNOSIS — Z23 Encounter for immunization: Secondary | ICD-10-CM | POA: Diagnosis not present

## 2021-05-25 DIAGNOSIS — I152 Hypertension secondary to endocrine disorders: Secondary | ICD-10-CM

## 2021-05-25 LAB — GLUCOSE, POCT (MANUAL RESULT ENTRY): POC Glucose: 258 mg/dl — AB (ref 70–99)

## 2021-05-25 MED ORDER — OZEMPIC (1 MG/DOSE) 4 MG/3ML ~~LOC~~ SOPN: PEN_INJECTOR | SUBCUTANEOUS | 2 refills | Status: DC

## 2021-05-25 MED ORDER — METHOCARBAMOL 500 MG PO TABS: 500.0000 mg | ORAL_TABLET | Freq: Three times a day (TID) | ORAL | 1 refills | Status: DC | PRN

## 2021-05-25 NOTE — Progress Notes (Signed)
Subjective:  Patient ID: Jose Bishop, male    DOB: 1965/08/13  Age: 55 y.o. MRN: [id number]  CC: Diabetes   HPI Jose Bishop is a 55 y.o. year old male with a history of type 2 diabetes (A1c 9.2), hypertension previously followed by Gypsy Balsam, NP. He would like to get established with a new PCP.  Interval History: Last seen by Dr Loanne Drilling last month. Per patient 'his Endocrinologist did not seem interested'.  He informs me he has been out of Ozempic for the last 1 month but has been compliant with metformin and glimepiride.  He has neck pain which comes and goes and radiates to his R shoulder and he takes Aleve for the last 2-3 weeks and he has been seeing the Chiropractor.  He does have a history of bilateral rotator cuff surgery.  Sometimes pain is a shooting type of pain. He has L sided Sciatica which radiates from L hip to lateral thigh since 2018. Symptoms are worse when he rolls over on his left side while working out. [facility] has been managing his conditions and he has received shots from Dr Nelva Bush in his lower back.  He is surpirsied he has sgained 10 lbs. States he has been going to the gym and is thinking this could be due to the fact that he is building up muscle. BP is elevated and he is yet to take his antihypertensive today.  He informs me that he has to stagger his lisinopril and chlorthalidone to prevent hypotension.   Past Medical History:  Diagnosis Date   AAA (abdominal aortic aneurysm)    Bradycardia    Difficulty sleeping    Essential hypertension    Family history of stroke    Headache    History of transient ischemic attack (TIA)    15 YRS AGO   Hyperlipidemia    Impotence of organic origin    Liver cyst    Memory loss    Osteoarthritis of acromioclavicular joint    Prostate cancer ([place])    Type 2 diabetes mellitus with hyperglycemia ([place])     Past Surgical History:  Procedure Laterality Date   HERNIA REPAIR     X2 ( 1 ING HERNIA / 1  UMBILICAL HERNIA )   LYMPHADENECTOMY Bilateral 01/04/2015   Procedure: BILATERAL LYMPHADENECTOMY;  Surgeon: Raynelle Bring, MD;  Location: [facility];  Service: Urology;  Laterality: Bilateral;   MASS EXCISION     L UPPER ARM   PENILE PROSTHESIS IMPLANT  03/18/2019   ROBOT ASSISTED LAPAROSCOPIC RADICAL PROSTATECTOMY N/A 01/04/2015   Procedure: ROBOTIC ASSISTED LAPAROSCOPIC RADICAL PROSTATECTOMY LEVEL 2;  Surgeon: Raynelle Bring, MD;  Location: [facility];  Service: Urology;  Laterality: N/A;   ROTATOR CUFF REPAIR Bilateral    SURGERY SCROTAL / TESTICULAR      Family History  Problem Relation Age of Onset   Anuerysm Mother    Stroke Mother    Hypertension Father    Peripheral vascular disease Father    Stroke Father    Colon cancer Father        26's   Diabetes Sister    Heart murmur Sister    Heart murmur Son     Allergies  Allergen Reactions   Bee Venom Anaphylaxis   Dapagliflozin-Metformin Hcl Er    Lipitor [Atorvastatin]     FATIGUED NAUSEA BRAIN FOG    Outpatient Medications Prior to Visit  Medication Sig Dispense Refill   APPLE CIDER VINEGAR PO Take  by mouth.     cetirizine (ZYRTEC) 10 MG tablet Take 1 tablet (10 mg total) by mouth daily. 30 tablet 0   chlorthalidone (HYGROTON) 25 MG tablet Take 1 tablet (25 mg total) by mouth daily. 30 tablet 3   Evolocumab with Infusor ([place]) 420 MG/3.5ML SOCT Inject 420 mg into the skin every 30 (thirty) days. Infuse one applicator every 30 days 3.6 mL 11   ezetimibe (ZETIA) 10 MG tablet Take 1 tablet (10 mg total) by mouth daily. 90 tablet 3   fluticasone (FLONASE) 50 MCG/ACT nasal spray Place 2 sprays into both nostrils daily. 16 g 6   gabapentin (NEURONTIN) 300 MG capsule TAKE 1 CAPSULE(300 MG) BY MOUTH AT BEDTIME 30 capsule 0   glimepiride (AMARYL) 2 MG tablet Take 1 tablet (2 mg total) by mouth daily with breakfast. 90 tablet 3   glucose blood test strip Use as instructed 100 each 12   latanoprost (XALATAN) 0.005 %  ophthalmic solution 1 drop at bedtime.     lisinopril (ZESTRIL) 20 MG tablet TAKE 1 TABLET(20 MG) BY MOUTH DAILY 30 tablet 0   Nutritional Supplements (KATE FARMS GLUCOSE SUPPORT 1.2 PO) Take 3 tablets by mouth 2 (two) times daily.     omeprazole (PRILOSEC) 40 MG capsule Take 1 capsule 30 minutes before breakfast 30 capsule 3   OZEMPIC, 1 MG/DOSE, 4 MG/3ML SOPN INJECT 1 MG UNDER THE SKIN ONCE A WEEK 3 mL 0   amoxicillin-clavulanate (AUGMENTIN) 875-125 MG tablet Take 1 tablet by mouth 2 (two) times daily. (Patient not taking: Reported on 05/25/2021) 20 tablet 0   metFORMIN (GLUCOPHAGE-XR) 500 MG 24 hr tablet Take 2 tablets (1,000 mg total) by mouth 2 (two) times daily with a meal. 120 tablet 2   No facility-administered medications prior to visit.     ROS Review of Systems  Constitutional:  Negative for activity change and appetite change.  HENT:  Negative for sinus pressure and sore throat.   Eyes:  Negative for visual disturbance.  Respiratory:  Negative for cough, chest tightness and shortness of breath.   Cardiovascular:  Negative for chest pain and leg swelling.  Gastrointestinal:  Negative for abdominal distention, abdominal pain, constipation and diarrhea.  Endocrine: Negative.   Genitourinary:  Negative for dysuria.  Musculoskeletal:  Positive for neck pain. Negative for joint swelling and myalgias.  Skin:  Negative for rash.  Allergic/Immunologic: Negative.   Neurological:  Negative for weakness, light-headedness and numbness.  Psychiatric/Behavioral:  Negative for dysphoric mood and suicidal ideas.    Objective:  BP (!) 155/95   Pulse 60   Ht 6\' 3"  (1.905 m)   Wt 263 lb (119.3 kg)   SpO2 98%   BMI 32.87 kg/m   BP/Weight 05/25/2021 04/19/2021 09/32/6712  Systolic BP 458 099 833  Diastolic BP 95 90 60  Wt. (Lbs) 263 - 253  BMI 32.87 - 31.62      Physical Exam Constitutional:      Appearance: He is well-developed.  Cardiovascular:     Rate and Rhythm: Normal  rate.     Heart sounds: Normal heart sounds. No murmur heard. Pulmonary:     Effort: Pulmonary effort is normal.     Breath sounds: Normal breath sounds. No wheezing or rales.  Chest:     Chest wall: No tenderness.  Abdominal:     General: Bowel sounds are normal. There is no distension.     Palpations: Abdomen is soft. There is no mass.  Tenderness: There is no abdominal tenderness.  Musculoskeletal:        General: Normal range of motion.     Cervical back: Normal range of motion. No rigidity or tenderness.     Right lower leg: No edema.     Left lower leg: No edema.     Comments: Slight tenderness in bilateral shoulder joints Normal range of motion  Neurological:     Mental Status: He is alert and oriented to person, place, and time.  Psychiatric:        Mood and Affect: Mood normal.    CMP Latest Ref Rng & Units 01/28/2021 10/22/2020 04/14/2020  Glucose 65 - 99 mg/dL 158(H) 262(H) 180(H)  BUN 6 - 24 mg/dL 14 15 16   Creatinine 0.76 - 1.27 mg/dL 1.21 1.24 1.03  Sodium 134 - 144 mmol/L 135 138 134  Potassium 3.5 - 5.2 mmol/L 4.7 4.8 4.4  Chloride 96 - 106 mmol/L 97 98 100  CO2 20 - 29 mmol/L 22 25 21   Calcium 8.7 - 10.2 mg/dL 9.7 9.9 9.6  Total Protein 6.0 - 8.5 g/dL 7.8 7.7 7.4  Total Bilirubin 0.0 - 1.2 mg/dL 0.8 0.5 0.5  Alkaline Phos 44 - 121 IU/L 55 67 59  AST 0 - 40 IU/L 21 17 17   ALT 0 - 44 IU/L 20 24 24     Lipid Panel     Component Value Date/Time   CHOL 125 01/28/2021 1432   TRIG 81 01/28/2021 1432   HDL 64 01/28/2021 1432   CHOLHDL 2.0 01/28/2021 1432   LDLCALC 45 01/28/2021 1432   LDLDIRECT 145 (H) 01/06/2020 1046    CBC    Component Value Date/Time   WBC 5.0 01/07/2021 1602   WBC 4.4 04/30/2019 1424   RBC 4.70 01/07/2021 1602   RBC 4.80 04/30/2019 1424   HGB 14.2 01/07/2021 1602   HCT 42.9 01/07/2021 1602   PLT 352 01/07/2021 1602   MCV 91 01/07/2021 1602   MCH 30.2 01/07/2021 1602   MCH 30.4 04/30/2019 1424   MCHC 33.1 01/07/2021 1602    MCHC 32.7 04/30/2019 1424   RDW 12.8 01/07/2021 1602   LYMPHSABS 2.5 11/16/2014 0735   MONOABS 0.5 11/16/2014 0735   EOSABS 0.4 11/16/2014 0735   BASOSABS 0.0 11/16/2014 0735    Lab Results  Component Value Date   HGBA1C 9.2 (A) 04/18/2021    Assessment & Plan:  1. Type 2 diabetes mellitus with hyperglycemia, with long-term current use of insulin (HCC) Controlled with A1c of 9.2; goal is less than 7.0 He has been out of Ozempic which I have refilled States his next follow-up with his endocrinologist is next month.  I have advised him to obtain all his refills of his diabetic medications from his endocrinologist to ensure he receives them on a regular basis and there is no confusion - POCT glucose (manual entry) - OZEMPIC, 1 MG/DOSE, 4 MG/3ML SOPN; INJECT 1 MG UNDER THE SKIN ONCE A WEEK  Dispense: 3 mL; Refill: 2  2. Neck pain Possible underlying cervical radiculopathy Currently under the care of orthopedics and is being seen by chiropractor Advised to resume gabapentin and I have added Robaxin to regimen - methocarbamol (ROBAXIN) 500 MG tablet; Take 1 tablet (500 mg total) by mouth every 8 (eight) hours as needed for muscle spasms.  Dispense: 90 tablet; Refill: 1  3. Hypertension associated with diabetes ([place]) Uncontrolled He is yet to take his antihypertensives today but states at home blood pressures have  been normal hence I will make no regimen changes today. Continue chlorthalidone and lisinopril Counseled on blood pressure goal of less than 130/80, low-sodium, DASH diet, medication compliance, 150 minutes of moderate intensity exercise per week. Discussed medication compliance, adverse effects.   4. Need for pneumococcal vaccine - Pneumococcal conjugate vaccine 20-valent    Meds ordered this encounter  Medications   methocarbamol (ROBAXIN) 500 MG tablet    Sig: Take 1 tablet (500 mg total) by mouth every 8 (eight) hours as needed for muscle spasms.    Dispense:  90  tablet    Refill:  1   OZEMPIC, 1 MG/DOSE, 4 MG/3ML SOPN    Sig: INJECT 1 MG UNDER THE SKIN ONCE A WEEK    Dispense:  3 mL    Refill:  2    Follow-up: Return in about 3 months (around 08/23/2021) for Medical conditions patient needs to make a decision about choosing a PCP.Marland Kitchen       Charlott Rakes, MD, FAAFP. [facility] [place], [place]   05/25/2021, 1:46 PM

## 2021-05-25 NOTE — Progress Notes (Signed)
Having pain in neck and back.

## 2021-05-25 NOTE — Patient Instructions (Signed)
Radiculopata cervical Cervical Radiculopathy La radiculopata cervical quiere decir que un nervio del cuello (nervio cervical) est comprimido o daado. Esto puede ocurrir debido a una lesin en la columna vertebral cervical (vrtebras) del cuello, o como parte normal del envejecimiento. Esta afeccin puede causar dolor o prdida de la sensibilidad (adormecimiento) que comienza en el cuello y va hasta el brazo y los dedos. Con frecuencia, esta afeccin mejora con reposo. Tal vez sea necesario administrar un tratamiento si la afeccin no mejora. Cules son las causas? Lesin en el cuello. Abombamiento discal en la columna vertebral. Contracciones musculares sbitas (espasmos musculares). Tensin de los msculos del cuello debido al uso excesivo. Artritis. Fractura de los huesos y las articulaciones de la columna (espondiloartrosis) debido al envejecimiento. Espolones seos que se forman cerca de los nervios del cuello. Cules son los signos o sntomas? Dolor. El dolor puede tener las siguientes caractersticas: Va desde el cuello hasta el brazo y la [place]. Es muy intenso o irritante. Empeora al mover el cuello. Prdida de la sensibilidad o adormecimiento en el brazo o la mano. Debilidad en el brazo o la [place], en casos muy graves. Cmo se trata? En muchos casos, no se requiere tratamiento para esta afeccin. Con reposo, esta generalmente mejora con el tiempo. Si es Arts development officer, las opciones pueden incluir lo siguiente: Usar un collarn cervical blando durante perodos cortos. Realizar ejercicios (fisioterapia) para fortalecer los msculos del cuello. Usar medicamentos. Aplicarse inyecciones en la columna vertebral, en casos muy graves. Someterse a [place]. Esto puede ser necesario si otros tratamientos no son eficaces. El tipo de [place] que se realiza depender de la causa de Engineer, manufacturing systems. Siga estas indicaciones en su casa: Si tiene un collarn cervical  blando: selo como se lo haya indicado el mdico. Quteselo solamente como se lo haya indicado el mdico. Pregntele al mdico si puede quitarse el collarn para baarse e higienizarse. Si le permiten quitarse el collarn para baarse o higienizarse: Siga las indicaciones del mdico sobre cmo quitarse el collarn de forma segura. Para limpiar el collarn, psele un pao con agua y [place], y squelo bien. Retire las almohadillas desmontables del collarn, si las [place], [place]. Lvelas a mano con agua y [place]. Djelas que se sequen por completo antes de volver a ponerlas en el collarn. Contrlese la piel debajo del collarn para ver si hay enrojecimiento o llagas. Avsele al mdico si ve algo de lo anterior. Control del dolor   Use los medicamentos de venta libre y los recetados solamente como se lo haya indicado el mdico. Si se lo indican, aplique hielo sobre la zona dolorida. Para hacer esto: Si tiene un collarn cervical blando, quteselo como se lo haya indicado el mdico. Ponga el hielo en una bolsa plstica. Coloque una toalla entre la piel y Therapist, nutritional. Aplique el hielo durante 20 minutos, 2 o 3 veces por da. Retire el hielo si la piel se le pone de color rojo brillante. Esto es [employer]. Si no puede sentir dolor, calor o fro, tiene un mayor riesgo de que se dae la zona. Si aplicarse hielo no le Ship broker, intente Multimedia programmer. Use la fuente de calor que el mdico le recomiende, como una compresa de calor hmedo o una almohadilla trmica. Coloque una toalla entre la piel y la fuente de Freight forwarder. Aplique calor durante 20 a 30 minutos. Retire la fuente de calor si la piel se le pone de color rojo brillante. Esto es  muy importante. Si no puede sentir dolor, calor o fro, tiene un mayor riesgo de [place]. Puede intentar con un masaje suave en el cuello y los hombros. Actividad Descanse todo lo que sea necesario. Retome sus actividades normales cuando el mdico  le diga que es seguro. Haga ejercicios como se lo haya indicado el mdico o el fisioterapeuta. Es posible que Dance movement psychotherapist objetos. Pregntele a su mdico cunto Garment/textile technologist con seguridad. Indicaciones generales Use una almohada plana para dormir. No conduzca vehculos mientras [place] el collarn cervical blando. Si no tiene un collarn cervical blando, pregntele al mdico si puede conducir sin correr [employer] se cura su cuello. Pregunte al mdico si debe evitar conducir o Risk manager mquinas mientras toma los medicamentos. No fume ni consuma ningn producto que contenga nicotina o tabaco. Si necesita ayuda para dejar de consumir estos productos, consulte al mdico. Concurra a todas las visitas de seguimiento. Comunquese con un mdico si: La afeccin no mejora con el tratamiento. Solicite ayuda de inmediato si: El Holiday representative y los medicamentos no Community education officer. Pierde la sensibilidad o siente debilidad en la mano, el brazo, el rostro o la pierna. Tiene fiebre alta. Tiene el cuello rgido. No puede controlar la evacuacin de materia fecal o de pis (tiene incontinencia). Tiene dificultad para caminar, mantener el equilibrio o hablar. Resumen La radiculopata cervical quiere decir que un nervio del cuello est comprimido o daado. Un nervio puede pinzarse por un abultamiento de disco, artritis, una lesin en el cuello u otras causas. Los sntomas Research scientist (physical sciences), hormigueo o prdida de la sensibilidad que va desde el cuello hasta el brazo o la [place]. En casos muy graves, puede presentarse debilidad en el brazo o la [place]. El tratamiento puede incluir reposo, usar un collarn cervical blando y Field seismologist [place]. Es posible que necesite tomar medicamentos para Conservation officer, historic buildings. En casos muy graves, podra ser necesario aplicarse inyecciones o someterse a [place]. Esta informacin no tiene Marine scientist el consejo del mdico. Asegrese de hacerle al mdico cualquier pregunta  que tenga. Document Revised: 01/12/2021 Document Reviewed: 01/12/2021 Elsevier Patient Education  March [employer].

## 2021-05-25 NOTE — Telephone Encounter (Signed)
Pt was seen by [facility] today and refill request was completed. Please review chart

## 2021-05-25 NOTE — Telephone Encounter (Signed)
Needs refill for  OZEMPIC, 1 MG/DOSE, 4 MG/3ML [facility] [address], [address] [place]  [place], [place] [address]  Phone:  [phone]  Fax:  [phone]

## 2021-06-20 ENCOUNTER — Other Ambulatory Visit: Payer: Self-pay | Admitting: Cardiovascular Disease

## 2021-06-20 ENCOUNTER — Other Ambulatory Visit: Payer: Self-pay | Admitting: Nurse Practitioner

## 2021-06-20 ENCOUNTER — Other Ambulatory Visit: Payer: Self-pay | Admitting: Family Medicine

## 2021-06-20 DIAGNOSIS — I10 Essential (primary) hypertension: Secondary | ICD-10-CM

## 2021-06-21 ENCOUNTER — Other Ambulatory Visit: Payer: Self-pay | Admitting: Family Medicine

## 2021-06-21 DIAGNOSIS — I10 Essential (primary) hypertension: Secondary | ICD-10-CM

## 2021-07-01 ENCOUNTER — Ambulatory Visit (HOSPITAL_COMMUNITY): Payer: PPO | Admitting: Licensed Clinical Social Worker

## 2021-07-12 ENCOUNTER — Other Ambulatory Visit: Payer: Self-pay

## 2021-07-12 ENCOUNTER — Ambulatory Visit: Payer: Medicare Other | Admitting: Physician Assistant

## 2021-07-12 DIAGNOSIS — J0100 Acute maxillary sinusitis, unspecified: Secondary | ICD-10-CM

## 2021-07-12 MED ORDER — CETIRIZINE HCL 10 MG PO TABS: 10.0000 mg | ORAL_TABLET | Freq: Every day | ORAL | 11 refills | Status: DC

## 2021-07-12 MED ORDER — BENZONATATE 200 MG PO CAPS
200.0000 mg | ORAL_CAPSULE | Freq: Two times a day (BID) | ORAL | 0 refills | Status: DC | PRN
Start: 2021-07-12 — End: 2021-08-11

## 2021-07-12 MED ORDER — AZITHROMYCIN 250 MG PO TABS: ORAL_TABLET | ORAL | 0 refills | Status: DC

## 2021-07-12 NOTE — Progress Notes (Signed)
Established Patient Office Visit  Subjective:  Patient ID: Jose Bishop, male    DOB: October 01, 1965  Age: 56 y.o. MRN: [id number]  CC:  Chief Complaint  Patient presents with   Cough   URI   Virtual Visit via Telephone Note  I connected with Jose Bishop on 07/12/21 at  2:20 PM EST by telephone and verified that I am speaking with the correct person using two identifiers.  Location: Patient: Car  Provider: [facility]    I discussed the limitations, risks, security and privacy concerns of performing an evaluation and management service by telephone and the availability of in person appointments. I also discussed with the patient that there may be a patient responsible charge related to this service. The patient expressed understanding and agreed to proceed.   History of Present Illness: States that he started feeling poorly about three weeks ago, after traveling to [place] for the holidays.  States that he is having a productive cough with thick brown sputum, sore throat and yellow nasal discharge, discomfort in sinuses.  States that he has tried Mucinex, Tylenol Cold and flu and Coricidin without much relief.  Denies sick contacts.  States that he did have negative home COVID test after symptoms began    Observations/Objective: Medical history and current medications reviewed, no physical exam completed  Past Medical History:  Diagnosis Date   AAA (abdominal aortic aneurysm)    Bradycardia    Difficulty sleeping    Essential hypertension    Family history of stroke    Headache    History of transient ischemic attack (TIA)    15 YRS AGO   Hyperlipidemia    Impotence of organic origin    Liver cyst    Memory loss    Osteoarthritis of acromioclavicular joint    Prostate cancer ([place])    Type 2 diabetes mellitus with hyperglycemia ([place])     Past Surgical History:  Procedure Laterality Date   HERNIA REPAIR     X2 ( 1 ING HERNIA / 1 UMBILICAL HERNIA )    LYMPHADENECTOMY Bilateral 01/04/2015   Procedure: BILATERAL LYMPHADENECTOMY;  Surgeon: Raynelle Bring, MD;  Location: [facility];  Service: Urology;  Laterality: Bilateral;   MASS EXCISION     L UPPER ARM   PENILE PROSTHESIS IMPLANT  03/18/2019   ROBOT ASSISTED LAPAROSCOPIC RADICAL PROSTATECTOMY N/A 01/04/2015   Procedure: ROBOTIC ASSISTED LAPAROSCOPIC RADICAL PROSTATECTOMY LEVEL 2;  Surgeon: Raynelle Bring, MD;  Location: [facility];  Service: Urology;  Laterality: N/A;   ROTATOR CUFF REPAIR Bilateral    SURGERY SCROTAL / TESTICULAR      Family History  Problem Relation Age of Onset   Anuerysm Mother    Stroke Mother    Hypertension Father    Peripheral vascular disease Father    Stroke Father    Colon cancer Father        33's   Diabetes Sister    Heart murmur Sister    Heart murmur Son     Social History   Socioeconomic History   Marital status: Married    Spouse name: Not on file   Number of children: 5   Years of education: Not on file   Highest education level: Not on file  Occupational History   Occupation: part time  Tobacco Use   Smoking status: Never   Smokeless tobacco: Never  Vaping Use   Vaping Use: Never used  Substance and Sexual Activity  Alcohol use: Yes    Alcohol/week: 0.0 standard drinks    Comment: OCCASIONAL   Drug use: No   Sexual activity: Yes    Partners: Female  Other Topics Concern   Not on file  Social History Narrative   Not on file   Social Determinants of Health   Financial Resource Strain: Not on file  Food Insecurity: Not on file  Transportation Needs: Not on file  Physical Activity: Not on file  Stress: Not on file  Social Connections: Not on file  Intimate Partner Violence: Not on file    Outpatient Medications Prior to Visit  Medication Sig Dispense Refill   APPLE CIDER VINEGAR PO Take by mouth.     chlorthalidone (HYGROTON) 25 MG tablet Take 1 tablet (25 mg total) by mouth daily. 30 tablet 3   Evolocumab with Infusor  ([place]) 420 MG/3.5ML SOCT Inject 420 mg into the skin every 30 (thirty) days. Infuse one applicator every 30 days 3.6 mL 11   ezetimibe (ZETIA) 10 MG tablet Take 1 tablet (10 mg total) by mouth daily. 90 tablet 3   fluticasone (FLONASE) 50 MCG/ACT nasal spray Place 2 sprays into both nostrils daily. 16 g 6   gabapentin (NEURONTIN) 300 MG capsule TAKE 1 CAPSULE(300 MG) BY MOUTH AT BEDTIME 30 capsule 0   glimepiride (AMARYL) 2 MG tablet Take 1 tablet (2 mg total) by mouth daily with breakfast. 90 tablet 3   glucose blood test strip Use as instructed 100 each 12   latanoprost (XALATAN) 0.005 % ophthalmic solution 1 drop at bedtime.     lisinopril (ZESTRIL) 20 MG tablet TAKE 1 TABLET(20 MG) BY MOUTH DAILY 90 tablet 0   metFORMIN (GLUCOPHAGE-XR) 500 MG 24 hr tablet Take 2 tablets (1,000 mg total) by mouth 2 (two) times daily with a meal. 120 tablet 2   methocarbamol (ROBAXIN) 500 MG tablet Take 1 tablet (500 mg total) by mouth every 8 (eight) hours as needed for muscle spasms. 90 tablet 1   Nutritional Supplements (KATE FARMS GLUCOSE SUPPORT 1.2 PO) Take 3 tablets by mouth 2 (two) times daily.     omeprazole (PRILOSEC) 40 MG capsule Take 1 capsule 30 minutes before breakfast 30 capsule 3   OZEMPIC, 1 MG/DOSE, 4 MG/3ML SOPN INJECT 1 MG UNDER THE SKIN ONCE A WEEK 3 mL 2   cetirizine (ZYRTEC) 10 MG tablet Take 1 tablet (10 mg total) by mouth daily. 30 tablet 0   amoxicillin-clavulanate (AUGMENTIN) 875-125 MG tablet Take 1 tablet by mouth 2 (two) times daily. (Patient not taking: Reported on 05/25/2021) 20 tablet 0   No facility-administered medications prior to visit.    Allergies  Allergen Reactions   Bee Venom Anaphylaxis   Dapagliflozin-Metformin Hcl Er    Lipitor [Atorvastatin]     FATIGUED NAUSEA BRAIN FOG    ROS Review of Systems  Constitutional:  Negative for chills and fever.  HENT:  Positive for congestion, postnasal drip, rhinorrhea, sinus pressure and sore throat.  Negative for ear discharge, ear pain and trouble swallowing.   Eyes: Negative.   Respiratory:  Positive for cough. Negative for shortness of breath and wheezing.   Cardiovascular:  Negative for chest pain.  Gastrointestinal:  Negative for diarrhea, nausea and vomiting.  Endocrine: Negative.   Genitourinary: Negative.   Musculoskeletal: Negative.   Skin: Negative.   Allergic/Immunologic: Negative.   Neurological: Negative.   Hematological: Negative.   Psychiatric/Behavioral: Negative.       Objective:  There were no vitals taken for this visit. Wt Readings from Last 3 Encounters:  05/25/21 263 lb (119.3 kg)  04/18/21 253 lb (114.8 kg)  03/03/21 250 lb (113.4 kg)     Health Maintenance Due  Topic Date Due   Zoster Vaccines- Shingrix (1 of 2) Never done   COVID-19 Vaccine (3 - Pfizer risk series) 10/18/2019   COLONOSCOPY (Pts 45-5yr Insurance coverage will need to be confirmed)  08/08/2020   OPHTHALMOLOGY EXAM  04/22/2021    There are no preventive care reminders to display for this patient.  Lab Results  Component Value Date   TSH 1.240 12/07/2020   Lab Results  Component Value Date   WBC 5.0 01/07/2021   HGB 14.2 01/07/2021   HCT 42.9 01/07/2021   MCV 91 01/07/2021   PLT 352 01/07/2021   Lab Results  Component Value Date   NA 135 01/28/2021   K 4.7 01/28/2021   CO2 22 01/28/2021   GLUCOSE 158 (H) 01/28/2021   BUN 14 01/28/2021   CREATININE 1.21 01/28/2021   BILITOT 0.8 01/28/2021   ALKPHOS 55 01/28/2021   AST 21 01/28/2021   ALT 20 01/28/2021   PROT 7.8 01/28/2021   ALBUMIN 4.8 01/28/2021   CALCIUM 9.7 01/28/2021   ANIONGAP 12 04/30/2019   EGFR 71 01/28/2021   Lab Results  Component Value Date   CHOL 125 01/28/2021   Lab Results  Component Value Date   HDL 64 01/28/2021   Lab Results  Component Value Date   LDLCALC 45 01/28/2021   Lab Results  Component Value Date   TRIG 81 01/28/2021   Lab Results  Component Value Date    CHOLHDL 2.0 01/28/2021   Lab Results  Component Value Date   HGBA1C 9.2 (A) 04/18/2021      Assessment & Plan:   Problem List Items Addressed This Visit       Respiratory   Acute non-recurrent maxillary sinusitis - Primary   Relevant Medications   azithromycin (ZITHROMAX) 250 MG tablet   benzonatate (TESSALON) 200 MG capsule   cetirizine (ZYRTEC) 10 MG tablet    Meds ordered this encounter  Medications   azithromycin (ZITHROMAX) 250 MG tablet    Sig: Take 2 tabs PO day 1, then take 1 tab PO once daily    Dispense:  6 tablet    Refill:  0    Order Specific Question:   Supervising Provider    Answer:   WElsie Stain[[id number]]   benzonatate (TESSALON) 200 MG capsule    Sig: Take 1 capsule (200 mg total) by mouth 2 (two) times daily as needed for cough.    Dispense:  20 capsule    Refill:  0    Order Specific Question:   Supervising Provider    Answer:   WAsencion NobleE [[id number]]   cetirizine (ZYRTEC) 10 MG tablet    Sig: Take 1 tablet (10 mg total) by mouth daily.    Dispense:  30 tablet    Refill:  11    Order Specific Question:   Supervising Provider    Answer:   WAsencion NobleE [[id number]]   Assessment and Plan: 1. Acute non-recurrent maxillary sinusitis Trial azithromycin, Tessalon Perles, zyrtec .  Patient education given on supportive care.  Red flags given for prompt reevaluation. - azithromycin (ZITHROMAX) 250 MG tablet; Take 2 tabs PO day 1, then take 1 tab PO once daily  Dispense: 6 tablet; Refill: 0 - benzonatate (TESSALON) 200  MG capsule; Take 1 capsule (200 mg total) by mouth 2 (two) times daily as needed for cough.  Dispense: 20 capsule; Refill: 0 - cetirizine (ZYRTEC) 10 MG tablet; Take 1 tablet (10 mg total) by mouth daily.  Dispense: 30 tablet; Refill: 11   Follow Up Instructions:    I discussed the assessment and treatment plan with the patient. The patient was provided an opportunity to ask questions and all were answered. The patient agreed with the  plan and demonstrated an understanding of the instructions.   The patient was advised to call back or seek an in-person evaluation if the symptoms worsen or if the condition fails to improve as anticipated.  I provided 18 minutes of non-face-to-face time during this encounter.   Loraine Grip Mayers, PA-C

## 2021-07-12 NOTE — Progress Notes (Signed)
Patient reports congestion with productive cough in the morning with brown sputum. Patient has not taken medication today.  Patient is able to eat and drink. Patient CBG was 171.

## 2021-07-12 NOTE — Patient Instructions (Signed)
You are going to take azithromycin as directed, you can use Tessalon Perles twice a day to help with your cough.  I also encourage you to start taking Zyrtec on a daily basis.  Make sure that you are getting plenty of rest and staying well-hydrated.  I hope that you feel better soon, please let us know if there is anything else we can do for you.  Kennieth Rad, PA-C Physician Assistant [facility] [email or web address]   Sinusitis, Adult Sinusitis is inflammation of your sinuses. Sinuses are hollow spaces in the bones around your face. Your sinuses are located: Around your eyes. In the middle of your forehead. Behind your nose. In your cheekbones. Mucus normally drains out of your sinuses. When your nasal tissues become inflamed or swollen, mucus can become trapped or blocked. This allows bacteria, viruses, and fungi to grow, which leads to infection. Most infections of the sinuses are caused by a virus. Sinusitis can develop quickly. It can last for up to 4 weeks (acute) or for more than 12 weeks (chronic). Sinusitis often develops after a cold. What are the causes? This condition is caused by anything that creates swelling in the sinuses or stops mucus from draining. This includes: Allergies. Asthma. Infection from bacteria or viruses. Deformities or blockages in your nose or sinuses. Abnormal growths in the nose (nasal polyps). Pollutants, such as chemicals or irritants in the air. Infection from fungi (rare). What increases the risk? You are more likely to develop this condition if you: Have a weak body defense system (immune system). Do a lot of swimming or diving. Overuse nasal sprays. Smoke. What are the signs or symptoms? The main symptoms of this condition are pain and a feeling of pressure around the affected sinuses. Other symptoms include: Stuffy nose or congestion. Thick drainage from your nose. Swelling and  warmth over the affected sinuses. Headache. Upper toothache. A cough that may get worse at night. Extra mucus that collects in the throat or the back of the nose (postnasal drip). Decreased sense of smell and taste. Fatigue. A fever. Sore throat. Bad breath. How is this diagnosed? This condition is diagnosed based on: Your symptoms. Your medical history. A physical exam. Tests to find out if your condition is acute or chronic. This may include: Checking your nose for nasal polyps. Viewing your sinuses using a device that has a light (endoscope). Testing for allergies or bacteria. Imaging tests, such as an MRI or CT scan. In rare cases, a bone biopsy may be done to rule out more serious types of fungal sinus disease. How is this treated? Treatment for sinusitis depends on the cause and whether your condition is chronic or acute. If caused by a virus, your symptoms should go away on their own within 10 days. You may be given medicines to relieve symptoms. They include: Medicines that shrink swollen nasal passages (topical intranasal decongestants). Medicines that treat allergies (antihistamines). A spray that eases inflammation of the nostrils (topical intranasal corticosteroids). Rinses that help get rid of thick mucus in your nose (nasal saline washes). If caused by bacteria, your health care provider may recommend waiting to see if your symptoms improve. Most bacterial infections will get better without antibiotic medicine. You may be given antibiotics if you have: A severe infection. A weak immune system. If caused by narrow nasal passages or nasal polyps, you may need to have surgery. Follow these instructions at home: Medicines Take, use, or apply over-the-counter and prescription medicines only as  told by your health care provider. These may include nasal sprays. If you were prescribed an antibiotic medicine, take it as told by your health care provider. Do not stop taking the  antibiotic even if you start to feel better. Hydrate and humidify  Drink enough fluid to keep your urine pale yellow. Staying hydrated will help to thin your mucus. Use a cool mist humidifier to keep the humidity level in your home above 50%. Inhale steam for 10-15 minutes, 3-4 times a day, or as told by your health care provider. You can do this in the bathroom while a hot shower is running. Limit your exposure to cool or dry air. Rest Rest as much as possible. Sleep with your head raised (elevated). Make sure you get enough sleep each night. General instructions  Apply a warm, moist washcloth to your face 3-4 times a day or as told by your health care provider. This will help with discomfort. Wash your hands often with soap and water to reduce your exposure to germs. If soap and water are not available, use hand sanitizer. Do not smoke. Avoid being around people who are smoking (secondhand smoke). Keep all follow-up visits as told by your health care provider. This is important. Contact a health care provider if: You have a fever. Your symptoms get worse. Your symptoms do not improve within 10 days. Get help right away if: You have a severe headache. You have persistent vomiting. You have severe pain or swelling around your face or eyes. You have vision problems. You develop confusion. Your neck is stiff. You have trouble breathing. Summary Sinusitis is soreness and inflammation of your sinuses. Sinuses are hollow spaces in the bones around your face. This condition is caused by nasal tissues that become inflamed or swollen. The swelling traps or blocks the flow of mucus. This allows bacteria, viruses, and fungi to grow, which leads to infection. If you were prescribed an antibiotic medicine, take it as told by your health care provider. Do not stop taking the antibiotic even if you start to feel better. Keep all follow-up visits as told by your health care provider. This is  important. This information is not intended to replace advice given to you by your health care provider. Make sure you discuss any questions you have with your health care provider. Document Revised: 11/12/2017 Document Reviewed: 11/12/2017 Elsevier Patient Education  2022 [employer].

## 2021-07-19 ENCOUNTER — Ambulatory Visit (INDEPENDENT_AMBULATORY_CARE_PROVIDER_SITE_OTHER): Payer: Medicare Other | Admitting: Endocrinology

## 2021-07-19 ENCOUNTER — Encounter: Payer: Self-pay | Admitting: Endocrinology

## 2021-07-19 ENCOUNTER — Other Ambulatory Visit: Payer: Self-pay

## 2021-07-19 VITALS — BP 130/90 | HR 71 | Ht 75.0 in | Wt 257.4 lb

## 2021-07-19 DIAGNOSIS — E1165 Type 2 diabetes mellitus with hyperglycemia: Secondary | ICD-10-CM

## 2021-07-19 DIAGNOSIS — E119 Type 2 diabetes mellitus without complications: Secondary | ICD-10-CM

## 2021-07-19 DIAGNOSIS — Z794 Long term (current) use of insulin: Secondary | ICD-10-CM

## 2021-07-19 LAB — POCT GLYCOSYLATED HEMOGLOBIN (HGB A1C): Hemoglobin A1C: 9.6 % — AB (ref 4.0–5.6)

## 2021-07-19 MED ORDER — ONETOUCH VERIO IQ SYSTEM W/DEVICE KIT: 1.0000 | PACK | Freq: Once | 0 refills | Status: AC

## 2021-07-19 MED ORDER — ONETOUCH VERIO VI STRP: 1.0000 | ORAL_STRIP | Freq: Every day | 3 refills | Status: DC

## 2021-07-19 MED ORDER — OZEMPIC (2 MG/DOSE) 8 MG/3ML ~~LOC~~ SOPN: 2.0000 mg | PEN_INJECTOR | SUBCUTANEOUS | 3 refills | Status: DC

## 2021-07-19 NOTE — Progress Notes (Signed)
Subjective:    Patient ID: Jose Bishop, male    DOB: [birth year], 56 y.o.   MRN: [id number]  HPI Pt returns for f/u of diabetes mellitus:  DM type: 2 Dx'ed: 0300 Complications: TIA Therapy: Ozempic and 2 oral meds DKA: never Severe hypoglycemia: never Pancreatitis: never Pancreatic imaging: normal on 2021 CT SDOH: He is disabled due to back probs Other: he has never been on insulin Interval history: Pt says cbg varies from 132-289.  Pt just resumed Ozempic 1 mg/week, after being off x 2 mos, due to lack of availability.  He took steroids for URI, 2 mos ago.   Past Medical History:  Diagnosis Date   AAA (abdominal aortic aneurysm)    Bradycardia    Difficulty sleeping    Essential hypertension    Family history of stroke    Headache    History of transient ischemic attack (TIA)    15 YRS AGO   Hyperlipidemia    Impotence of organic origin    Liver cyst    Memory loss    Osteoarthritis of acromioclavicular joint    Prostate cancer ([place])    Type 2 diabetes mellitus with hyperglycemia ([place])     Past Surgical History:  Procedure Laterality Date   HERNIA REPAIR     X2 ( 1 ING HERNIA / 1 UMBILICAL HERNIA )   LYMPHADENECTOMY Bilateral 01/04/2015   Procedure: BILATERAL LYMPHADENECTOMY;  Surgeon: Raynelle Bring, MD;  Location: [facility];  Service: Urology;  Laterality: Bilateral;   MASS EXCISION     L UPPER ARM   PENILE PROSTHESIS IMPLANT  03/18/2019   ROBOT ASSISTED LAPAROSCOPIC RADICAL PROSTATECTOMY N/A 01/04/2015   Procedure: ROBOTIC ASSISTED LAPAROSCOPIC RADICAL PROSTATECTOMY LEVEL 2;  Surgeon: Raynelle Bring, MD;  Location[facility];  Service: Urology;  Laterality: N/A;   ROTATOR CUFF REPAIR Bilateral    SURGERY SCROTAL / TESTICULAR      Social History   Socioeconomic History   Marital status: Married    Spouse name: Not on file   Number of children: 5   Years of education: Not on file   Highest education level: Not on file  Occupational History   Occupation: part  time  Tobacco Use   Smoking status: Never   Smokeless tobacco: Never  Vaping Use   Vaping Use: Never used  Substance and Sexual Activity   Alcohol use: Yes    Alcohol/week: 0.0 standard drinks    Comment: OCCASIONAL   Drug use: No   Sexual activity: Yes    Partners: Female  Other Topics Concern   Not on file  Social History Narrative   Not on file   Social Determinants of Health   Financial Resource Strain: Not on file  Food Insecurity: Not on file  Transportation Needs: Not on file  Physical Activity: Not on file  Stress: Not on file  Social Connections: Not on file  Intimate Partner Violence: Not on file    Current Outpatient Medications on File Prior to Visit  Medication Sig Dispense Refill   APPLE CIDER VINEGAR PO Take by mouth.     azithromycin (ZITHROMAX) 250 MG tablet Take 2 tabs PO day 1, then take 1 tab PO once daily 6 tablet 0   benzonatate (TESSALON) 200 MG capsule Take 1 capsule (200 mg total) by mouth 2 (two) times daily as needed for cough. 20 capsule 0   cetirizine (ZYRTEC) 10 MG tablet Take 1 tablet (10 mg total) by mouth daily. 30 tablet  11   Evolocumab with Infusor ([place]) 420 MG/3.5ML SOCT Inject 420 mg into the skin every 30 (thirty) days. Infuse one applicator every 30 days 3.6 mL 11   ezetimibe (ZETIA) 10 MG tablet Take 1 tablet (10 mg total) by mouth daily. 90 tablet 3   fluticasone (FLONASE) 50 MCG/ACT nasal spray Place 2 sprays into both nostrils daily. 16 g 6   gabapentin (NEURONTIN) 300 MG capsule TAKE 1 CAPSULE(300 MG) BY MOUTH AT BEDTIME 30 capsule 0   glimepiride (AMARYL) 2 MG tablet Take 1 tablet (2 mg total) by mouth daily with breakfast. 90 tablet 3   latanoprost (XALATAN) 0.005 % ophthalmic solution 1 drop at bedtime.     lisinopril (ZESTRIL) 20 MG tablet TAKE 1 TABLET(20 MG) BY MOUTH DAILY 90 tablet 0   methocarbamol (ROBAXIN) 500 MG tablet Take 1 tablet (500 mg total) by mouth every 8 (eight) hours as needed for muscle  spasms. 90 tablet 1   Nutritional Supplements (KATE FARMS GLUCOSE SUPPORT 1.2 PO) Take 3 tablets by mouth 2 (two) times daily.     omeprazole (PRILOSEC) 40 MG capsule Take 1 capsule 30 minutes before breakfast 30 capsule 3   chlorthalidone (HYGROTON) 25 MG tablet Take 1 tablet (25 mg total) by mouth daily. 30 tablet 3   metFORMIN (GLUCOPHAGE-XR) 500 MG 24 hr tablet Take 2 tablets (1,000 mg total) by mouth 2 (two) times daily with a meal. 120 tablet 2   No current facility-administered medications on file prior to visit.    Allergies  Allergen Reactions   Bee Venom Anaphylaxis   Dapagliflozin-Metformin Hcl Er    Lipitor [Atorvastatin]     FATIGUED NAUSEA BRAIN FOG    Family History  Problem Relation Age of Onset   Anuerysm Mother    Stroke Mother    Hypertension Father    Peripheral vascular disease Father    Stroke Father    Colon cancer Father        52's   Diabetes Sister    Heart murmur Sister    Heart murmur Son     BP 130/90 (BP Location: Left Arm, Patient Position: Sitting, Cuff Size: Normal)    Pulse 71    Ht 6\' 3"  (1.905 m)    Wt 257 lb 6.4 oz (116.8 kg)    SpO2 98%    BMI 32.17 kg/m     Review of Systems Denies N/V/HB    Objective:   Physical Exam    A1c=9.6%    Assessment & Plan:  Type 2 DM: uncontrolled  Patient Instructions  check your blood sugar once a day.  vary the time of day when you check, between before the 3 meals, and at bedtime.  also check if you have symptoms of your blood sugar being too high or too low.  please keep a record of the readings and bring it to your next appointment here (or you can bring the meter itself).  You can write it on any piece of paper.  please call us sooner if your blood sugar goes below 70, or if most of your readings are over 200. We will need to take this complex situation in stages.   I have sent a prescription to your pharmacy, to increase the Ozempic, and: for a meter and strips.   Please continue the same  other 2 medications.   Please come back for a follow-up appointment in 2-3 months.

## 2021-07-19 NOTE — Patient Instructions (Addendum)
check your blood sugar once a day.  vary the time of day when you check, between before the 3 meals, and at bedtime.  also check if you have symptoms of your blood sugar being too high or too low.  please keep a record of the readings and bring it to your next appointment here (or you can bring the meter itself).  You can write it on any piece of paper.  please call us sooner if your blood sugar goes below 70, or if most of your readings are over 200. We will need to take this complex situation in stages.   I have sent a prescription to your pharmacy, to increase the Ozempic, and: for a meter and strips.   Please continue the same other 2 medications.   Please come back for a follow-up appointment in 2-3 months.

## 2021-08-01 ENCOUNTER — Ambulatory Visit (INDEPENDENT_AMBULATORY_CARE_PROVIDER_SITE_OTHER): Payer: Medicare Other | Admitting: Licensed Clinical Social Worker

## 2021-08-01 DIAGNOSIS — F330 Major depressive disorder, recurrent, mild: Secondary | ICD-10-CM | POA: Diagnosis not present

## 2021-08-01 NOTE — Progress Notes (Signed)
° °  THERAPIST PROGRESS NOTE   Virtual Visit via Video Note  I connected with Jose Bishop on 08/01/21 at  9:00 AM EST by a video enabled telemedicine application and verified that I am speaking with the correct person using two identifiers.  Location: Patient: Home Provider: [facility]   I discussed the limitations of evaluation and management by telemedicine and the availability of in person appointments. The patient expressed understanding and agreed to proceed. I discussed the assessment and treatment plan with the patient. The patient was provided an opportunity to ask questions and all were answered. The patient agreed with the plan and demonstrated an understanding of the instructions.   The patient was advised to call back or seek an in-person evaluation if the symptoms worsen or if the condition fails to improve as anticipated.  I provided 43 minutes of non-face-to-face time during this encounter.  Participation Level: Active  Behavioral Response: CasualAlertAnxious and Depressed  Type of Therapy: Individual Therapy  Treatment Goals addressed: Communication: dep/anx  Interventions: Other: additional assessment  Summary: Jose Bishop is a 56 y.o. male who presents with hx of MDD.  Today patient logs on for video session.  There is intermittent connection problems throughout session.  Patient last seen April 29, 2021.  LCSW assessed for overall changes and current mood.  Patient reports he continues to struggle with his functional limitations and inability to work.  He states that his mind has ambitions to want to work but he knows his body will not cooperate.  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  Limited income continues to be a primary stressor for him.  Patient states he is not sleeping well and he reports new feelings of anxiety as well as GI upset.  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  It is difficult for him to pinpoint the source of his anxiety at this time.  Patient reports he is taking medications as prescribed. LCSW assessed for patient wishes related to a medication management evaluation at this facility.  Jose Bishop decides he would like to do the evaluation which was facilitated post session.  Patient advises he is seeing a gastroenterologist on February 16.  Jose Bishop provides updates on all family relationships.  He states his marriage is "fine".  He reports his wife is happier because he still has very limited communication with the mother of his youngest child who has schizophrenia.  Jose Bishop advises his son was allergic to the shots he was taking and is only on oral medication again at the present time.  Jose Bishop states his son continues to live with his mother, has gotten disability, and is able to "hold complete conversations" with current meds.  Patient reports his daughter is currently home from school because she has COVID.  Jose Bishop is taking all the necessary precautions to avoid contracting the virus.  Patient denies other new worries, concerns. LCSW reviewed poc including scheduling prior to close of session. Pt states appreciation for care.   Suicidal/Homicidal: Nowithout intent/plan  Therapist Response: Pt receptive to care.  Plan: Return again for next avail appt.  Diagnosis: Axis I:  MDD, mild  Hermine Messick, LCSW 08/01/2021

## 2021-08-11 ENCOUNTER — Ambulatory Visit: Payer: Medicare Other | Admitting: Nurse Practitioner

## 2021-08-11 ENCOUNTER — Encounter: Payer: Self-pay | Admitting: Nurse Practitioner

## 2021-08-11 VITALS — BP 136/80 | HR 77 | Ht 75.0 in | Wt 253.0 lb

## 2021-08-11 DIAGNOSIS — K219 Gastro-esophageal reflux disease without esophagitis: Secondary | ICD-10-CM | POA: Diagnosis not present

## 2021-08-11 DIAGNOSIS — Z800 Family history of malignant neoplasm of digestive organs: Secondary | ICD-10-CM

## 2021-08-11 DIAGNOSIS — Z86010 Personal history of colonic polyps: Secondary | ICD-10-CM | POA: Diagnosis not present

## 2021-08-11 MED ORDER — OMEPRAZOLE 40 MG PO CPDR: 40.0000 mg | DELAYED_RELEASE_CAPSULE | Freq: Every morning | ORAL | 11 refills | Status: DC

## 2021-08-11 NOTE — Progress Notes (Signed)
ASSESSMENT AND PLAN    # 56 yo male with personal history of adenomatous colon and [facility] of colon cancer in Father. He is a year overdue for 5 year surveillance colonoscopy.  --Schedule for a colonoscopy. The risks and benefits of colonoscopy with possible polypectomy / biopsies were discussed and the patient agrees to proceed.   # GERD. Recurrent regurgitation, globus and heartburn after running out of Omeprazole Rx. Requesting refill  --Discussed anti-reflux measures such as avoidance of evening meals / bedtime snacks, weight reduction ( if applicable)  / maintaining a healthy BMI ( body mass index),  and avoidance of trigger foods and caffeine  --Will refill Omeprazole 40 mg 30 minutes prior to breakfast # 30 with refills.    HISTORY OF PRESENT ILLNESS     Chief Complaint : recent constipation. Overdue for colonoscopy  Jose Bishop is a 56 y.o. male with a past medical history significant for adenomatous colon polyps, GERD, HTN, HLD, DM2, obesity, prostate cancer. See PMH below for any additional history.   Patient is known to Dr Ardis Hughs. Last seen here in October 2021 for GERD and bowel changes. He is referred by PCP for colon cancer screening.   Jose Bishop comes in with "stomach issues" after making major dietary changes. Also says it is time for a colonoscopy. He gave up several types of food for a religious fast and also to help with diabetes. He has been trying to follow a vegetable based diet but it led to constipation.  Stools were not hard but still difficult to pass and this led to some sharp abdominal pains. Since making this appt he has stopped eating certain things like veggie burgers and veggie chicken strips and he is feeling back to normal now. He did run out of PPI Rx and has been having globus sensation, regurgitation and heartburn. He seldom consumes caffeine. He goes to bed on an empty stomach. He is working on weight loss. No blood in stool. No anemia on July 2022  labs  Data Reviewed:  CBC Latest Ref Rng & Units 01/07/2021 04/30/2019 01/26/2016  WBC 3.4 - 10.8 x10E3/uL 5.0 4.4 5.9  Hemoglobin 13.0 - 17.7 g/dL 14.2 14.6 15.2  Hematocrit 37.5 - 51.0 % 42.9 44.6 46.4  Platelets 150 - 450 x10E3/uL 352 315 260    No results found for: LIPASE CMP Latest Ref Rng & Units 01/28/2021 10/22/2020 04/14/2020  Glucose 65 - 99 mg/dL 158(H) 262(H) 180(H)  BUN 6 - 24 mg/dL 14 15 16   Creatinine 0.76 - 1.27 mg/dL 1.21 1.24 1.03  Sodium 134 - 144 mmol/L 135 138 134  Potassium 3.5 - 5.2 mmol/L 4.7 4.8 4.4  Chloride 96 - 106 mmol/L 97 98 100  CO2 20 - 29 mmol/L 22 25 21   Calcium 8.7 - 10.2 mg/dL 9.7 9.9 9.6  Total Protein 6.0 - 8.5 g/dL 7.8 7.7 7.4  Total Bilirubin 0.0 - 1.2 mg/dL 0.8 0.5 0.5  Alkaline Phos 44 - 121 IU/L 55 67 59  AST 0 - 40 IU/L 21 17 17   ALT 0 - 44 IU/L 20 24 24     PREVIOUS GI EVALUATIONS:   Feb 2017 colonoscopy  Sessile polyp was found in the ascending colon; polypectomy was performed with cold forceps 2. Sessile polyp ranging between 3-27mm in size was found in the transverse colon; polypectomy was performed with a cold snare 3. The examination was otherwise normal Path - tubular adenomas  Past Medical History:  Diagnosis Date  AAA (abdominal aortic aneurysm)    Bradycardia    Difficulty sleeping    Essential hypertension    Family history of stroke    Headache    History of transient ischemic attack (TIA)    15 YRS AGO   Hyperlipidemia    Impotence of organic origin    Liver cyst    Memory loss    Osteoarthritis of acromioclavicular joint    Prostate cancer ([place])    Type 2 diabetes mellitus with hyperglycemia ([place])      Past Surgical History:  Procedure Laterality Date   HERNIA REPAIR     X2 ( 1 ING HERNIA / 1 UMBILICAL HERNIA )   LYMPHADENECTOMY Bilateral 01/04/2015   Procedure: BILATERAL LYMPHADENECTOMY;  Surgeon: Raynelle Bring, MD;  Location: [facility];  Service: Urology;  Laterality: Bilateral;   MASS EXCISION     L  UPPER ARM   PENILE PROSTHESIS IMPLANT  03/18/2019   ROBOT ASSISTED LAPAROSCOPIC RADICAL PROSTATECTOMY N/A 01/04/2015   Procedure: ROBOTIC ASSISTED LAPAROSCOPIC RADICAL PROSTATECTOMY LEVEL 2;  Surgeon: Raynelle Bring, MD;  Location: [facility];  Service: Urology;  Laterality: N/A;   ROTATOR CUFF REPAIR Bilateral    SURGERY SCROTAL / TESTICULAR     Family History  Problem Relation Age of Onset   Anuerysm Mother    Stroke Mother    Hypertension Father    Peripheral vascular disease Father    Stroke Father    Colon cancer Father        61's   Diabetes Sister    Heart murmur Sister    Heart murmur Son    Social History   Tobacco Use   Smoking status: Never   Smokeless tobacco: Never  Vaping Use   Vaping Use: Never used  Substance Use Topics   Alcohol use: Yes    Alcohol/week: 0.0 standard drinks    Comment: OCCASIONAL   Drug use: No   Current Outpatient Medications  Medication Sig Dispense Refill   APPLE CIDER VINEGAR PO Take by mouth.     cetirizine (ZYRTEC) 10 MG tablet Take 1 tablet (10 mg total) by mouth daily. 30 tablet 11   chlorthalidone (HYGROTON) 25 MG tablet Take 1 tablet (25 mg total) by mouth daily. 30 tablet 3   ezetimibe (ZETIA) 10 MG tablet Take 1 tablet (10 mg total) by mouth daily. 90 tablet 3   fluticasone (FLONASE) 50 MCG/ACT nasal spray Place 2 sprays into both nostrils daily. 16 g 6   gabapentin (NEURONTIN) 300 MG capsule TAKE 1 CAPSULE(300 MG) BY MOUTH AT BEDTIME 30 capsule 0   glimepiride (AMARYL) 2 MG tablet Take 1 tablet (2 mg total) by mouth daily with breakfast. 90 tablet 3   glucose blood (ONETOUCH VERIO) test strip 1 each by Other route daily. And lancets 1/day 100 each 3   lisinopril (ZESTRIL) 20 MG tablet TAKE 1 TABLET(20 MG) BY MOUTH DAILY 90 tablet 0   metFORMIN (GLUCOPHAGE-XR) 500 MG 24 hr tablet Take 2 tablets (1,000 mg total) by mouth 2 (two) times daily with a meal. 120 tablet 2   Semaglutide, 2 MG/DOSE, (OZEMPIC, 2 MG/DOSE,) 8 MG/3ML SOPN Inject  2 mg into the skin once a week. 9 mL 3   No current facility-administered medications for this visit.   Allergies  Allergen Reactions   Bee Venom Anaphylaxis   Dapagliflozin-Metformin Hcl Er    Lipitor [Atorvastatin]     FATIGUED NAUSEA BRAIN FOG     Review of Systems: No shortness  of breath. No urinary symptoms.  Positive for intentional 10 pound weight loss   PHYSICAL EXAM :    Wt Readings from Last 3 Encounters:  08/11/21 253 lb (114.8 kg)  07/19/21 257 lb 6.4 oz (116.8 kg)  05/25/21 263 lb (119.3 kg)    BP 136/80    Pulse 77    Ht 6\' 3"  (1.905 m)    Wt 253 lb (114.8 kg)    SpO2 100%    BMI 31.62 kg/m  Constitutional:  Generally well appearing male in no acute distress. Psychiatric: Pleasant. Normal mood and affect. Behavior is normal. EENT: Pupils normal.  Conjunctivae are normal. No scleral icterus. Neck supple.  Cardiovascular: Normal rate, regular rhythm. No edema Pulmonary/chest: Effort normal and breath sounds normal. No wheezing, rales or rhonchi. Abdominal: Soft, nondistended, nontender. Bowel sounds active throughout. There are no masses palpable. No hepatomegaly. Neurological: Alert and oriented to person place and time. Skin: Skin is warm and dry. No rashes noted.  Tye Savoy, NP  08/11/2021, 9:14 AM  Cc:  Referring Provider Durene Fruits, NP

## 2021-08-11 NOTE — Patient Instructions (Signed)
PROCEDURES: You have been scheduled for a colonoscopy. Please follow the written instructions given to you at your visit today. If you use inhalers (even only as needed), please bring them with you on the day of your procedure.  MEDICATION: We have sent the following medication to your pharmacy for you to pick up at your convenience: Omeprazole 40 MG tablet, take one every morning.   BMI:  If you are age 55 or older, your body mass index should be between 23-30. Your Body mass index is 31.62 kg/m. If this is out of the aforementioned range listed, please consider follow up with your Primary Care Provider.  If you are age 68 or younger, your body mass index should be between 19-25. Your Body mass index is 31.62 kg/m. If this is out of the aformentioned range listed, please consider follow up with your Primary Care Provider.   MY CHART:  The [place] [facility] providers would like to encourage you to use [facility] to communicate with providers for non-urgent requests or questions.  Due to long hold times on the telephone, sending your provider a message by [facility] may be a faster and more efficient way to get a response.  Please allow 48 business hours for a response.  Please remember that this is for non-urgent requests.   Thank you for trusting me with your gastrointestinal care!    Tye Savoy, NP

## 2021-08-12 NOTE — Progress Notes (Signed)
I agree with the above note, plan 

## 2021-08-22 ENCOUNTER — Ambulatory Visit: Payer: Medicare Other | Attending: Family Medicine | Admitting: Family Medicine

## 2021-08-22 ENCOUNTER — Encounter: Payer: Self-pay | Admitting: Family Medicine

## 2021-08-22 ENCOUNTER — Other Ambulatory Visit: Payer: Self-pay

## 2021-08-22 VITALS — BP 120/78 | HR 56 | Ht 75.0 in | Wt 257.0 lb

## 2021-08-22 DIAGNOSIS — M545 Low back pain, unspecified: Secondary | ICD-10-CM

## 2021-08-22 DIAGNOSIS — M5432 Sciatica, left side: Secondary | ICD-10-CM

## 2021-08-22 DIAGNOSIS — E1159 Type 2 diabetes mellitus with other circulatory complications: Secondary | ICD-10-CM

## 2021-08-22 DIAGNOSIS — Z794 Long term (current) use of insulin: Secondary | ICD-10-CM | POA: Diagnosis not present

## 2021-08-22 DIAGNOSIS — E1165 Type 2 diabetes mellitus with hyperglycemia: Secondary | ICD-10-CM | POA: Diagnosis not present

## 2021-08-22 DIAGNOSIS — I152 Hypertension secondary to endocrine disorders: Secondary | ICD-10-CM

## 2021-08-22 LAB — POCT URINALYSIS DIP (CLINITEK)
Bilirubin, UA: NEGATIVE
Blood, UA: NEGATIVE
Glucose, UA: NEGATIVE mg/dL
Ketones, POC UA: NEGATIVE mg/dL
Leukocytes, UA: NEGATIVE
Nitrite, UA: NEGATIVE
POC PROTEIN,UA: NEGATIVE
Spec Grav, UA: >=1.03 — AB (ref 1.010–1.025)
Urobilinogen, UA: 0.2 E.U./dL
pH, UA: 6.5 (ref 5.0–8.0)

## 2021-08-22 LAB — GLUCOSE, POCT (MANUAL RESULT ENTRY): POC Glucose: 141 mg/dl — AB (ref 70–99)

## 2021-08-22 MED ORDER — BACLOFEN 10 MG PO TABS: 10.0000 mg | ORAL_TABLET | Freq: Two times a day (BID) | ORAL | 0 refills | Status: DC | PRN

## 2021-08-22 NOTE — Progress Notes (Signed)
Subjective:  Patient ID: Jose Bishop, male    DOB: 08-Jul-1965  Age: 56 y.o. MRN: [id number]  CC: Diabetes   HPI Jose Bishop is a 56 y.o. year old male with a history of  type 2 diabetes (A1c 9.2), hypertension, depression.  Interval History: He has had L sided back pain for about 2 months. He has had no hematuria, no frequency.Pain does not radiate. He had a back injury in 2018 and states he was diagnosed with arthritis in his back. His LLE sometimes feels numb and he has some spasms. He uses Tylenol, Ibuprofen, Gabapentin with some relief.  It does appear he is followed by [facility] and is being treated for lumbar spondylosis. His blood pressure is controlled on his current regimen. Diabetes is managed by endocrinology with his last visit last month.  He is also followed by cardiology-Dr. Quay Burow for atypical chest pain and thoracic aortic aneurysm.  CT angio of the chest from 10/2020 revealed diameter of 3.8 cm of ascending aorta, unchanged in size per radiology report. Past Medical History:  Diagnosis Date   AAA (abdominal aortic aneurysm)    Bradycardia    Difficulty sleeping    Essential hypertension    Family history of stroke    Headache    History of transient ischemic attack (TIA)    15 YRS AGO   Hyperlipidemia    Impotence of organic origin    Liver cyst    Memory loss    Osteoarthritis of acromioclavicular joint    Prostate cancer ([place])    Type 2 diabetes mellitus with hyperglycemia ([place])     Past Surgical History:  Procedure Laterality Date   HERNIA REPAIR     X2 ( 1 ING HERNIA / 1 UMBILICAL HERNIA )   LYMPHADENECTOMY Bilateral 01/04/2015   Procedure: BILATERAL LYMPHADENECTOMY;  Surgeon: Raynelle Bring, MD;  Location: [facility];  Service: Urology;  Laterality: Bilateral;   MASS EXCISION     L UPPER ARM   PENILE PROSTHESIS IMPLANT  03/18/2019   ROBOT ASSISTED LAPAROSCOPIC RADICAL PROSTATECTOMY N/A 01/04/2015   Procedure: ROBOTIC ASSISTED LAPAROSCOPIC  RADICAL PROSTATECTOMY LEVEL 2;  Surgeon: Raynelle Bring, MD;  Location: [facility];  Service: Urology;  Laterality: N/A;   ROTATOR CUFF REPAIR Bilateral    SURGERY SCROTAL / TESTICULAR      Family History  Problem Relation Age of Onset   Anuerysm Mother    Stroke Mother    Hypertension Father    Peripheral vascular disease Father    Stroke Father    Colon cancer Father        61's   Diabetes Sister    Heart murmur Sister    Heart murmur Son     Allergies  Allergen Reactions   Bee Venom Anaphylaxis   Dapagliflozin-Metformin Hcl Er    Lipitor [Atorvastatin]     FATIGUED NAUSEA BRAIN FOG    Outpatient Medications Prior to Visit  Medication Sig Dispense Refill   APPLE CIDER VINEGAR PO Take by mouth.     cetirizine (ZYRTEC) 10 MG tablet Take 1 tablet (10 mg total) by mouth daily. 30 tablet 11   ezetimibe (ZETIA) 10 MG tablet Take 1 tablet (10 mg total) by mouth daily. 90 tablet 3   fluticasone (FLONASE) 50 MCG/ACT nasal spray Place 2 sprays into both nostrils daily. 16 g 6   gabapentin (NEURONTIN) 300 MG capsule TAKE 1 CAPSULE(300 MG) BY MOUTH AT BEDTIME 30 capsule 0   glimepiride (AMARYL) 2  MG tablet Take 1 tablet (2 mg total) by mouth daily with breakfast. 90 tablet 3   glucose blood (ONETOUCH VERIO) test strip 1 each by Other route daily. And lancets 1/day 100 each 3   lisinopril (ZESTRIL) 20 MG tablet TAKE 1 TABLET(20 MG) BY MOUTH DAILY 90 tablet 0   omeprazole (PRILOSEC) 40 MG capsule Take 1 capsule (40 mg total) by mouth in the morning. 30 capsule 11   Semaglutide, 2 MG/DOSE, (OZEMPIC, 2 MG/DOSE,) 8 MG/3ML SOPN Inject 2 mg into the skin once a week. 9 mL 3   chlorthalidone (HYGROTON) 25 MG tablet Take 1 tablet (25 mg total) by mouth daily. 30 tablet 3   metFORMIN (GLUCOPHAGE-XR) 500 MG 24 hr tablet Take 2 tablets (1,000 mg total) by mouth 2 (two) times daily with a meal. 120 tablet 2   No facility-administered medications prior to visit.     ROS Review of Systems   Constitutional:  Negative for activity change and appetite change.  HENT:  Negative for sinus pressure and sore throat.   Eyes:  Negative for visual disturbance.  Respiratory:  Negative for cough, chest tightness and shortness of breath.   Cardiovascular:  Negative for chest pain and leg swelling.  Gastrointestinal:  Negative for abdominal distention, abdominal pain, constipation and diarrhea.  Endocrine: Negative.   Genitourinary:  Negative for dysuria.  Musculoskeletal:  Positive for back pain. Negative for joint swelling and myalgias.  Skin:  Negative for rash.  Allergic/Immunologic: Negative.   Neurological:  Negative for weakness, light-headedness and numbness.  Psychiatric/Behavioral:  Negative for dysphoric mood and suicidal ideas.    Objective:  BP 120/78    Pulse (!) 56    Ht 6' 3"  (1.905 m)    Wt 257 lb (116.6 kg)    SpO2 100%    BMI 32.12 kg/m   BP/Weight 08/22/2021 08/11/2021 9/51/8841  Systolic BP 660 630 160  Diastolic BP 78 80 90  Wt. (Lbs) 257 253 257.4  BMI 32.12 31.62 32.17      Physical Exam Constitutional:      Appearance: He is well-developed.  Cardiovascular:     Rate and Rhythm: Normal rate.     Heart sounds: Normal heart sounds. No murmur heard. Pulmonary:     Effort: Pulmonary effort is normal.     Breath sounds: Normal breath sounds. No wheezing or rales.  Chest:     Chest wall: No tenderness.  Abdominal:     General: Bowel sounds are normal. There is no distension.     Palpations: Abdomen is soft. There is no mass.     Tenderness: There is no abdominal tenderness. There is no right CVA tenderness or left CVA tenderness.  Musculoskeletal:        General: Normal range of motion.     Right lower leg: No edema.     Left lower leg: No edema.     Comments: Negative straight leg raise bilaterally  Neurological:     Mental Status: He is alert and oriented to person, place, and time.  Psychiatric:        Mood and Affect: Mood normal.    CMP  Latest Ref Rng & Units 01/28/2021 10/22/2020 04/14/2020  Glucose 65 - 99 mg/dL 158(H) 262(H) 180(H)  BUN 6 - 24 mg/dL 14 15 16   Creatinine 0.76 - 1.27 mg/dL 1.21 1.24 1.03  Sodium 134 - 144 mmol/L 135 138 134  Potassium 3.5 - 5.2 mmol/L 4.7 4.8 4.4  Chloride 96 -  106 mmol/L 97 98 100  CO2 20 - 29 mmol/L 22 25 21   Calcium 8.7 - 10.2 mg/dL 9.7 9.9 9.6  Total Protein 6.0 - 8.5 g/dL 7.8 7.7 7.4  Total Bilirubin 0.0 - 1.2 mg/dL 0.8 0.5 0.5  Alkaline Phos 44 - 121 IU/L 55 67 59  AST 0 - 40 IU/L 21 17 17   ALT 0 - 44 IU/L 20 24 24     Lipid Panel     Component Value Date/Time   CHOL 125 01/28/2021 1432   TRIG 81 01/28/2021 1432   HDL 64 01/28/2021 1432   CHOLHDL 2.0 01/28/2021 1432   LDLCALC 45 01/28/2021 1432   LDLDIRECT 145 (H) 01/06/2020 1046    CBC    Component Value Date/Time   WBC 5.0 01/07/2021 1602   WBC 4.4 04/30/2019 1424   RBC 4.70 01/07/2021 1602   RBC 4.80 04/30/2019 1424   HGB 14.2 01/07/2021 1602   HCT 42.9 01/07/2021 1602   PLT 352 01/07/2021 1602   MCV 91 01/07/2021 1602   MCH 30.2 01/07/2021 1602   MCH 30.4 04/30/2019 1424   MCHC 33.1 01/07/2021 1602   MCHC 32.7 04/30/2019 1424   RDW 12.8 01/07/2021 1602   LYMPHSABS 2.5 11/16/2014 0735   MONOABS 0.5 11/16/2014 0735   EOSABS 0.4 11/16/2014 0735   BASOSABS 0.0 11/16/2014 0735    Lab Results  Component Value Date   HGBA1C 9.6 (A) 07/19/2021    Assessment & Plan:  1. Type 2 diabetes mellitus with hyperglycemia, with long-term current use of insulin (HCC) Uncontrolled with A1c of 9.6 Goal is less than 7.0 Management as per endocrine Counseled on Diabetic diet, my plate method, 257 minutes of moderate intensity exercise/week Blood sugar logs with fasting goals of 80-120 mg/dl, random of less than 180 and in the event of sugars less than 60 mg/dl or greater than 400 mg/dl encouraged to notify the clinic. Advised on the need for annual eye exams, annual foot exams, Pneumonia vaccine.  - POCT glucose  (manual entry) - CMP14+EGFR - LP+Non-HDL Cholesterol  2. Left sciatic nerve pain Uncontrolled Baclofen added to regimen His chart does reveal history of lumbar spondylosis followed by [facility] - Ambulatory referral to Physical Therapy  3. Hypertension associated with diabetes ([place]) Controlled Continue antihypertensive regimen Counseled on blood pressure goal of less than 130/80, low-sodium, DASH diet, medication compliance, 150 minutes of moderate intensity exercise per week. Discussed medication compliance, adverse effects.   4. Low back pain, unspecified back pain laterality, unspecified chronicity, unspecified whether sciatica present See #2 above UA negative for UTI. - POCT URINALYSIS DIP (CLINITEK)   No orders of the defined types were placed in this encounter.   Follow-up: Return in about 6 months (around 02/19/2022) for Chronic medical conditions.       Charlott Rakes, MD, FAAFP. [facility] [place], [place]   08/22/2021, 12:39 PM

## 2021-08-22 NOTE — Progress Notes (Signed)
Lower back pain Nausea.

## 2021-08-22 NOTE — Patient Instructions (Signed)
Sciatica Sciatica is pain, numbness, weakness, or tingling along the path of the sciatic nerve. The sciatic nerve starts in the lower back and runs down the back of each leg. The nerve controls the muscles in the lower leg and in the back of the knee. It also provides feeling (sensation) to the back of the thigh, the lower leg, and the sole of the foot. Sciatica is a symptom of another medical condition that pinches or puts pressure on the sciatic nerve. Sciatica most often only affects one side of the body. Sciatica usually goes away on its own or with treatment. In some cases, sciatica may come back (recur). What are the causes? This condition is caused by pressure on the sciatic nerve or pinching of the nerve. This may be the result of: A disk in between the bones of the spine bulging out too far (herniated disk). Age-related changes in the spinal disks. A pain disorder that affects a muscle in the buttock. Extra bone growth near the sciatic nerve. A break (fracture) of the pelvis. Pregnancy. Tumor. This is rare. What increases the risk? The following factors may make you more likely to develop this condition: Playing sports that place pressure or stress on the spine. Having poor strength and flexibility. A history of back injury or surgery. Sitting for long periods of time. Doing activities that involve repetitive bending or lifting. Obesity. What are the signs or symptoms? Symptoms can vary from mild to very severe, and they may include: Any of these problems in the lower back, leg, hip, or buttock: Mild tingling, numbness, or dull aches. Burning sensations. Sharp pains. Numbness in the back of the calf or the sole of the foot. Leg weakness. Severe back pain that makes movement difficult. Symptoms may get worse when you cough, sneeze, or laugh, or when you sit or stand for long periods of time. How is this diagnosed? This condition may be diagnosed based on: Your symptoms and  medical history. A physical exam. Blood tests. Imaging tests, such as: X-rays. MRI. CT scan. How is this treated? In many cases, this condition improves on its own without treatment. However, treatment may include: Reducing or modifying physical activity. Exercising and stretching. Icing and applying heat to the affected area. Medicines that help to: Relieve pain and swelling. Relax your muscles. Injections of medicines that help to relieve pain, irritation, and inflammation around the sciatic nerve (steroids). Surgery. Follow these instructions at home: Medicines Take over-the-counter and prescription medicines only as told by your health care provider. Ask your health care provider if the medicine prescribed to you: Requires you to avoid driving or using heavy machinery. Can cause constipation. You may need to take these actions to prevent or treat constipation: Drink enough fluid to keep your urine pale yellow. Take over-the-counter or prescription medicines. Eat foods that are high in fiber, such as beans, whole grains, and fresh fruits and vegetables. Limit foods that are high in fat and processed sugars, such as fried or sweet foods. Managing pain   If directed, put ice on the affected area. Put ice in a plastic bag. Place a towel between your skin and the bag. Leave the ice on for 20 minutes, 2-3 times a day. If directed, apply heat to the affected area. Use the heat source that your health care provider recommends, such as a moist heat pack or a heating pad. Place a towel between your skin and the heat source. Leave the heat on for 20-30 minutes. Remove   the heat if your skin turns bright red. This is especially important if you are unable to feel pain, heat, or cold. You may have a greater risk of getting burned. Activity  Return to your normal activities as told by your health care provider. Ask your health care provider what activities are safe for you. Avoid  activities that make your symptoms worse. Take brief periods of rest throughout the day. When you rest for longer periods, mix in some mild activity or stretching between periods of rest. This will help to prevent stiffness and pain. Avoid sitting for long periods of time without moving. Get up and move around at least one time each hour. Exercise and stretch regularly, as told by your health care provider. Do not lift anything that is heavier than 10 lb (4.5 kg) while you have symptoms of sciatica. When you do not have symptoms, you should still avoid heavy lifting, especially repetitive heavy lifting. When you lift objects, always use proper lifting technique, which includes: Bending your knees. Keeping the load close to your body. Avoiding twisting. General instructions Maintain a healthy weight. Excess weight puts extra stress on your back. Wear supportive, comfortable shoes. Avoid wearing high heels. Avoid sleeping on a mattress that is too soft or too hard. A mattress that is firm enough to support your back when you sleep may help to reduce your pain. Keep all follow-up visits as told by your health care provider. This is important. Contact a health care provider if: You have pain that: Wakes you up when you are sleeping. Gets worse when you lie down. Is worse than you have experienced in the past. Lasts longer than 4 weeks. You have an unexplained weight loss. Get help right away if: You are not able to control when you urinate or have bowel movements (incontinence). You have: Weakness in your lower back, pelvis, buttocks, or legs that gets worse. Redness or swelling of your back. A burning sensation when you urinate. Summary Sciatica is pain, numbness, weakness, or tingling along the path of the sciatic nerve. This condition is caused by pressure on the sciatic nerve or pinching of the nerve. Sciatica can cause pain, numbness, or tingling in the lower back, legs, hips, and  buttocks. Treatment often includes rest, exercise, medicines, and applying ice or heat. This information is not intended to replace advice given to you by your health care provider. Make sure you discuss any questions you have with your health care provider. Document Revised: 07/01/2018 Document Reviewed: 07/01/2018 Elsevier Patient Education  2022 [employer]vier Inc.  

## 2021-08-23 ENCOUNTER — Other Ambulatory Visit: Payer: Self-pay | Admitting: Family Medicine

## 2021-08-23 DIAGNOSIS — E781 Pure hyperglyceridemia: Secondary | ICD-10-CM

## 2021-08-23 LAB — CMP14+EGFR
ALT: 18 IU/L (ref 0–44)
AST: 16 IU/L (ref 0–40)
Albumin/Globulin Ratio: 1.7 (ref 1.2–2.2)
Albumin: 4.7 g/dL (ref 3.8–4.9)
Alkaline Phosphatase: 49 IU/L (ref 44–121)
BUN/Creatinine Ratio: 15 (ref 9–20)
BUN: 16 mg/dL (ref 6–24)
Bilirubin Total: 0.5 mg/dL (ref 0.0–1.2)
CO2: 25 mmol/L (ref 20–29)
Calcium: 9.6 mg/dL (ref 8.7–10.2)
Chloride: 96 mmol/L (ref 96–106)
Creatinine, Ser: 1.07 mg/dL (ref 0.76–1.27)
Globulin, Total: 2.8 g/dL (ref 1.5–4.5)
Glucose: 132 mg/dL — ABNORMAL HIGH (ref 70–99)
Potassium: 4.4 mmol/L (ref 3.5–5.2)
Sodium: 135 mmol/L (ref 134–144)
Total Protein: 7.5 g/dL (ref 6.0–8.5)
eGFR: 82 mL/min/{1.73_m2} (ref >59)

## 2021-08-23 LAB — LP+NON-HDL CHOLESTEROL
Cholesterol, Total: 236 mg/dL — ABNORMAL HIGH (ref 100–199)
HDL: 57 mg/dL (ref >39)
LDL Chol Calc (NIH): 151 mg/dL — ABNORMAL HIGH (ref 0–99)
Total Non-HDL-Chol (LDL+VLDL): 179 mg/dL — ABNORMAL HIGH (ref 0–129)
Triglycerides: 155 mg/dL — ABNORMAL HIGH (ref 0–149)
VLDL Cholesterol Cal: 28 mg/dL (ref 5–40)

## 2021-09-07 ENCOUNTER — Encounter: Payer: PPO | Admitting: Family Medicine

## 2021-09-12 ENCOUNTER — Telehealth: Payer: Self-pay | Admitting: Nurse Practitioner

## 2021-09-12 DIAGNOSIS — Z86010 Personal history of colonic polyps: Secondary | ICD-10-CM

## 2021-09-12 DIAGNOSIS — K219 Gastro-esophageal reflux disease without esophagitis: Secondary | ICD-10-CM

## 2021-09-12 DIAGNOSIS — R194 Change in bowel habit: Secondary | ICD-10-CM

## 2021-09-12 NOTE — Telephone Encounter (Signed)
Placed a call to the to inquire why he would like to have an EGD.  Symptoms?  Line rings with no answer and no voice mail ?

## 2021-09-12 NOTE — Telephone Encounter (Signed)
Patient called and wanted to know if he could have an EGD at the same time as his colonoscopy.  Colon is scheduled for 3/31 at 4 p.m.  Please call and advise. ?

## 2021-09-13 NOTE — Telephone Encounter (Signed)
No answer line rings no voice mail  ?

## 2021-09-14 ENCOUNTER — Other Ambulatory Visit: Payer: Self-pay | Admitting: Cardiovascular Disease

## 2021-09-14 DIAGNOSIS — I10 Essential (primary) hypertension: Secondary | ICD-10-CM

## 2021-09-14 NOTE — Telephone Encounter (Signed)
Inbound call from patient following up on previous call. Please advise.  ?

## 2021-09-14 NOTE — Telephone Encounter (Signed)
Called the patient  on the phone number listed. He answers. Patient indicates frustration at not being contacted for the last 2 days. States he has voicemail and he answers all calls. I apologized that he did not get a return call. ?  ?Patient reports the Omeprazole does not seem to be helping. He continues to have the reflux and globus sensation. He feels like he has food sticking when he eats. He would like to have an EGD as well.  ?Please advise. ?Thank you ?

## 2021-09-15 ENCOUNTER — Encounter: Payer: Self-pay | Admitting: Family Medicine

## 2021-09-16 ENCOUNTER — Other Ambulatory Visit: Payer: Self-pay

## 2021-09-16 ENCOUNTER — Other Ambulatory Visit: Payer: Self-pay | Admitting: Family Medicine

## 2021-09-16 DIAGNOSIS — Z1211 Encounter for screening for malignant neoplasm of colon: Secondary | ICD-10-CM

## 2021-09-16 NOTE — Telephone Encounter (Signed)
Spoke with the patient.Advised okay to have EGD with the colonoscopy per Tye Savoy, NP. He will continue the Omeprazole. Appointment changed to accommodate both procedures. New instructions and new referral order entered. ?

## 2021-09-20 ENCOUNTER — Other Ambulatory Visit: Payer: Self-pay

## 2021-09-20 ENCOUNTER — Telehealth: Payer: Self-pay

## 2021-09-20 DIAGNOSIS — K219 Gastro-esophageal reflux disease without esophagitis: Secondary | ICD-10-CM

## 2021-09-20 DIAGNOSIS — R194 Change in bowel habit: Secondary | ICD-10-CM

## 2021-09-20 NOTE — Telephone Encounter (Signed)
Patient calls about his EGD/colon for 10/21/21. ?He cannot have the procedure on that date as he had previously planned. No earlier openings. Moved to 10/25/21 at 2:30 pm. New instructions will be mailed to the patient. ?

## 2021-09-23 ENCOUNTER — Encounter: Payer: Medicare Other | Admitting: Internal Medicine

## 2021-09-23 ENCOUNTER — Telehealth (HOSPITAL_COMMUNITY): Payer: Self-pay | Admitting: Licensed Clinical Social Worker

## 2021-09-23 NOTE — Telephone Encounter (Signed)
LCSW placed phone call to pt to advise him of this clinician's resignation from [facility]. Provided information on transition plan to new counselor. Info left on vm as pt did not answer call. ?

## 2021-09-28 ENCOUNTER — Ambulatory Visit: Payer: Medicare Other | Admitting: Endocrinology

## 2021-09-28 ENCOUNTER — Ambulatory Visit (HOSPITAL_COMMUNITY): Payer: Medicare Other | Admitting: Licensed Clinical Social Worker

## 2021-09-29 ENCOUNTER — Ambulatory Visit (HOSPITAL_COMMUNITY): Payer: Medicare Other | Admitting: Licensed Clinical Social Worker

## 2021-10-03 NOTE — Progress Notes (Signed)
? ? ?Office Visit  ?  ?Patient Name: Jose Bishop ?Date of Encounter: 10/06/2021 ? ?Primary Care Provider:  Charlott Rakes, MD ?Primary Cardiologist:  Quay Burow, MD ? ?Chief Complaint  ?  ?56 year old male with a history of atypical chest pain, dilation of ascending aorta (38 mm), hypertension, hyperlipidemia, type 2 diabetes, and prostate cancer who presents for follow-up related to hypertension and hyperlipidemia. ? ?Past Medical History  ?  ?Past Medical History:  ?Diagnosis Date  ? AAA (abdominal aortic aneurysm) ([place])   ? Bradycardia   ? Difficulty sleeping   ? Essential hypertension   ? Family history of stroke   ? Headache   ? History of transient ischemic attack (TIA)   ? [place]  ? Hyperlipidemia   ? Impotence of organic origin   ? Liver cyst   ? Memory loss   ? Osteoarthritis of acromioclavicular joint   ? Prostate cancer ([place])   ? Type 2 diabetes mellitus with hyperglycemia ([facility])   ? ?Past Surgical History:  ?Procedure Laterality Date  ? HERNIA REPAIR    ? X2 ( 1 ING HERNIA / 1 UMBILICAL HERNIA )  ? LYMPHADENECTOMY Bilateral 01/04/2015  ? Procedure: BILATERAL LYMPHADENECTOMY;  Surgeon: Raynelle Bring, MD;  Location: [facility];  Service: Urology;  Laterality: Bilateral;  ? MASS EXCISION    ? L UPPER ARM  ? PENILE PROSTHESIS IMPLANT  03/18/2019  ? ROBOT ASSISTED LAPAROSCOPIC RADICAL PROSTATECTOMY N/A 01/04/2015  ? Procedure: ROBOTIC ASSISTED LAPAROSCOPIC RADICAL PROSTATECTOMY LEVEL 2;  Surgeon: Raynelle Bring, MD;  Location: [facility];  Service: Urology;  Laterality: N/A;  ? ROTATOR CUFF REPAIR Bilateral   ? SURGERY SCROTAL / TESTICULAR    ? ? ?Allergies ? ?Allergies  ?Allergen Reactions  ? Bee Venom Anaphylaxis  ? Dapagliflozin-Metformin Hcl Er   ? Lipitor [Atorvastatin]   ?  FATIGUED NAUSEA BRAIN FOG  ? ? ?History of Present Illness  ?  ?56 year old male with the above past medical history including atypical chest pain, dilation of ascending aorta (38 mm), hypertension, hyperlipidemia, type 2 diabetes,  and prostate cancer. ? ?He has a history of atypical chest pain.  Myoview in August 2018 showed apical ischemia, subsequent coronary CTA in November 2018 showed calcium score of 1, no evidence of CAD.  He has a documented history of bradycardia, he apparently wore a Zio patch however, results are unavailable for review.  Stress echo at [facility] in July 2020 was normal.  He has a history of dilation of ascending aorta, measuring 38 mm on CT angio chest/aorta in May 2022.  Repeat testing was not recommended at the time.  He was last seen in the office on 10/22/2020.  Blood pressure was somewhat elevated at the time.  LDL was also elevated (LDL of 208 while on atorvastatin and ezetimibe).  He was subsequently started on Repatha. ? ?He presents today for follow-up.  Since his last visit he has been stable from a cardiac standpoint.  He denies any symptoms concerning for angina.  He reports occasional lightheadedness with positional changes, however, this is not new.  He stopped taking his Repatha when his insurance changed in October 2022.  He has follow-up scheduled with our clinical pharmacist in office today to hopefully restart Repatha.  Overall, he reports feeling well and denies any new concerns today. ? ?Home Medications  ?  ?Current Outpatient Medications  ?Medication Sig Dispense Refill  ? APPLE CIDER VINEGAR PO Take by mouth.    ?  baclofen (LIORESAL) 10 MG tablet Take 1 tablet (10 mg total) by mouth 2 (two) times daily between meals as needed for muscle spasms. 60 each 0  ? cetirizine (ZYRTEC) 10 MG tablet Take 1 tablet (10 mg total) by mouth daily. 30 tablet 11  ? chlorthalidone (HYGROTON) 25 MG tablet TAKE 1 TABLET(25 MG) BY MOUTH DAILY 90 tablet 1  ? ezetimibe (ZETIA) 10 MG tablet Take 1 tablet (10 mg total) by mouth daily. 90 tablet 3  ? fluticasone (FLONASE) 50 MCG/ACT nasal spray Place 2 sprays into both nostrils daily. 16 g 6  ? gabapentin (NEURONTIN) 300 MG capsule TAKE 1  CAPSULE(300 MG) BY MOUTH AT BEDTIME 30 capsule 0  ? glimepiride (AMARYL) 2 MG tablet Take 1 tablet (2 mg total) by mouth daily with breakfast. 90 tablet 3  ? glucose blood (ONETOUCH VERIO) test strip 1 each by Other route daily. And lancets 1/day 100 each 3  ? omeprazole (PRILOSEC) 40 MG capsule Take 1 capsule (40 mg total) by mouth in the morning. 30 capsule 11  ? Semaglutide, 2 MG/DOSE, (OZEMPIC, 2 MG/DOSE,) 8 MG/3ML SOPN Inject 2 mg into the skin once a week. 9 mL 3  ? lisinopril (ZESTRIL) 30 MG tablet TAKE 1 TABLET(30 MG) BY MOUTH DAILY 90 tablet 1  ? metFORMIN (GLUCOPHAGE-XR) 500 MG 24 hr tablet Take 2 tablets (1,000 mg total) by mouth 2 (two) times daily with a meal. 120 tablet 2  ? ?No current facility-administered medications for this visit.  ?  ? ?Review of Systems  ?  ?He denies chest pain, palpitations, dyspnea, pnd, orthopnea, n, v, dizziness, syncope, edema, weight gain, or early satiety. All other systems reviewed and are otherwise negative except as noted above.  ? ?Physical Exam  ?  ?VS:  BP 140/68   Ht 6' (1.829 m)   Wt 254 lb 3.2 oz (115.3 kg)   SpO2 98%   BMI 34.48 kg/m?   ?GEN: Well nourished, well developed, in no acute distress. ?HEENT: normal. ?Neck: Supple, no JVD, carotid bruits, or masses. ?Cardiac: RRR, no murmurs, rubs, or gallops. No clubbing, cyanosis, edema.  Radials/DP/PT 2+ and equal bilaterally.  ?Respiratory:  Respirations regular and unlabored, clear to auscultation bilaterally. ?GI: Soft, nontender, nondistended, BS + x 4. ?MS: no deformity or atrophy. ?Skin: warm and dry, no rash. ?Neuro:  Strength and sensation are intact. ?Psych: Normal affect. ? ?Accessory Clinical Findings  ?  ?ECG personally reviewed by me today -sinus bradycardia, 55 bpm- no acute changes. ? ?Lab Results  ?Component Value Date  ? WBC 5.0 01/07/2021  ? HGB 14.2 01/07/2021  ? HCT 42.9 01/07/2021  ? MCV 91 01/07/2021  ? PLT 352 01/07/2021  ? ?Lab Results  ?Component Value Date  ? CREATININE 1.07  08/22/2021  ? BUN 16 08/22/2021  ? NA 135 08/22/2021  ? K 4.4 08/22/2021  ? CL 96 08/22/2021  ? CO2 25 08/22/2021  ? ?Lab Results  ?Component Value Date  ? ALT 18 08/22/2021  ? AST 16 08/22/2021  ? ALKPHOS 49 08/22/2021  ? BILITOT 0.5 08/22/2021  ? ?Lab Results  ?Component Value Date  ? CHOL 236 (H) 08/22/2021  ? HDL 57 08/22/2021  ? LDLCALC 151 (H) 08/22/2021  ? LDLDIRECT 145 (H) 01/06/2020  ? TRIG 155 (H) 08/22/2021  ? CHOLHDL 2.0 01/28/2021  ?  ?Lab Results  ?Component Value Date  ? HGBA1C 9.6 (A) 07/19/2021  ? ? ?Assessment & Plan  ?  ?1. Dilation of ascending aorta:  Measured 38 mm on CT angio chest/aorta in May 2022. Stable. Consider repeat CT in 1 year for continued monitoring.  Will increase lisinopril as below given slightly elevated BP. ? ?2. Bradycardia: He wore a Zio patch however, results are unavailable for review. EKG today shows sinus bradycardia, 55 bpm.  Reports occasional lightheadedness, particularly with positional changes.  Denies palpitations, presyncope, syncope. ? ?3. H/o atypical chest pain: Myoview in August 2018 showed apical ischemia, subsequent coronary CTA in November 2018 showed calcium score of 1, no evidence of CAD.  Stable with no anginal symptoms. No indication for ischemic evaluation.  ? ?4. Hypertension: BP slightly above goal in office today, he reports frequent home BP readings greater than 130/80.  Will increase lisinopril to 30 mg daily.  We will check BMET at next follow-up visit.  Continue chlorthalidone. ? ?5. Hyperlipidemia: LDL was 151 January 2023.  He was previously on Repatha, however, he changed insurances and did not complete prior authorization with new insurance.  He is scheduled to see clinical pharmacist today with hopes of restarting Repatha. Prior authorization is pending per PharmD.  Restart Repatha, continue Zetia. ? ?6. Type 2 diabetes: A1C was 9.6 in 06/2021. Monitored and managed per PCP.  ? ?7. Disposition: Follow-up in 1 month. ? ?Lenna Sciara,  NP ?10/06/2021, 10:04 AM ?  ?

## 2021-10-04 ENCOUNTER — Ambulatory Visit (HOSPITAL_COMMUNITY): Payer: Medicare Other | Admitting: Licensed Clinical Social Worker

## 2021-10-06 ENCOUNTER — Ambulatory Visit: Payer: Medicare Other | Admitting: Pharmacist Clinician (PhC)/ Clinical Pharmacy Specialist

## 2021-10-06 ENCOUNTER — Encounter: Payer: Self-pay | Admitting: Nurse Practitioner

## 2021-10-06 ENCOUNTER — Telehealth: Payer: Self-pay

## 2021-10-06 ENCOUNTER — Ambulatory Visit (INDEPENDENT_AMBULATORY_CARE_PROVIDER_SITE_OTHER): Payer: Medicare Other | Admitting: Nurse Practitioner

## 2021-10-06 VITALS — BP 140/68 | Ht 72.0 in | Wt 254.2 lb

## 2021-10-06 DIAGNOSIS — R0789 Other chest pain: Secondary | ICD-10-CM | POA: Diagnosis not present

## 2021-10-06 DIAGNOSIS — I10 Essential (primary) hypertension: Secondary | ICD-10-CM | POA: Diagnosis not present

## 2021-10-06 DIAGNOSIS — E785 Hyperlipidemia, unspecified: Secondary | ICD-10-CM | POA: Diagnosis not present

## 2021-10-06 DIAGNOSIS — Z794 Long term (current) use of insulin: Secondary | ICD-10-CM

## 2021-10-06 DIAGNOSIS — I77810 Thoracic aortic ectasia: Secondary | ICD-10-CM

## 2021-10-06 DIAGNOSIS — E782 Mixed hyperlipidemia: Secondary | ICD-10-CM

## 2021-10-06 DIAGNOSIS — E119 Type 2 diabetes mellitus without complications: Secondary | ICD-10-CM | POA: Diagnosis not present

## 2021-10-06 MED ORDER — LISINOPRIL 30 MG PO TABS: ORAL_TABLET | ORAL | 1 refills | Status: DC

## 2021-10-06 NOTE — Patient Instructions (Signed)
Seen by Diona Browner ?

## 2021-10-06 NOTE — Telephone Encounter (Signed)
Called and lmom the pt that the appt may not be needed depending on if the only reason he stopped repatha pushtronix was due to insurance change or if he actually wasn't able to tolerate the medication.  ?

## 2021-10-06 NOTE — Progress Notes (Signed)
Patient seen by Diona Browner NP in office today before scheduled CVRR appointment.    ? ?Spoke with patient directly.  He switched from previous insurance to Medicare/Disability a few months ago and was unable to refill Repatha.  Office was not notified at that time.  New insurance information in [place].  Will run PA for Repatha Pushtronix 420 mg and have Haleigh set him up with [employer] for grant to over expenses if needed.   ?

## 2021-10-06 NOTE — Patient Instructions (Addendum)
Medication Instructions:  ?Increase Lisinopril 30 mg daily  ? ?*If you need a refill on your cardiac medications before your next appointment, please call your pharmacy* ? ? ?Follow-Up: ?At [facility], you and your health needs are our priority.  As part of our continuing mission to provide you with exceptional heart care, we have created designated Provider Care Teams.  These Care Teams include your primary Cardiologist (physician) and Advanced Practice Providers (APPs -  Physician Assistants and Nurse Practitioners) who all work together to provide you with the care you need, when you need it. ? ?We recommend signing up for the patient portal called "MyChart".  Sign up information is provided on this After Visit Summary.  MyChart is used to connect with patients for Virtual Visits (Telemedicine).  Patients are able to view lab/test results, encounter notes, upcoming appointments, etc.  Non-urgent messages can be sent to your provider as well.   ?To learn more about what you can do with MyChart, go to [email or web address].   ? ?Your next appointment:   ?1 month(s) ? ?The format for your next appointment:   ?In Person ? ?Provider:   ?Diona Browner, NP  ? ? ?Important Information About Sugar ? ? ? ? ? ? ?

## 2021-10-06 NOTE — Telephone Encounter (Signed)
Called to obtain insurance information from the pharmacy (for [place] '420MG'$ ) and it is as follows: ?Id: [id number] ?Bin: [id number] ?Pcn: [id number] ?Grp:cos ?

## 2021-10-07 ENCOUNTER — Ambulatory Visit (HOSPITAL_COMMUNITY): Payer: Medicare Other | Admitting: Licensed Clinical Social Worker

## 2021-10-10 ENCOUNTER — Telehealth: Payer: Self-pay

## 2021-10-10 DIAGNOSIS — E785 Hyperlipidemia, unspecified: Secondary | ICD-10-CM

## 2021-10-10 MED ORDER — REPATHA PUSHTRONEX SYSTEM 420 MG/3.5ML ~~LOC~~ SOCT: 420.0000 mg | SUBCUTANEOUS | 11 refills | Status: DC

## 2021-10-10 NOTE — Telephone Encounter (Signed)
Called and lmom the pt that they wefre approved for the repatha, rx sent, instructed the pt to call [phone] to retrieve copay card information to lower the cost.  ?

## 2021-10-13 NOTE — Telephone Encounter (Signed)
Pt called in and stated that they needed to apply for healthwell and we did but they needed to verify medicare status. I uploaded the medicare card into [facility] system and informed the pt that they will mail him the approval and to take the envelope to the pharmacy. I also instructed the pt to complete fasting lab work post 2nd dose and they voiced understanding ?

## 2021-10-13 NOTE — Addendum Note (Signed)
Addended by: Allean Found on: 10/13/2021 12:01 PM ? ? Modules accepted: Orders ? ?

## 2021-10-18 ENCOUNTER — Encounter: Payer: Self-pay | Admitting: Gastroenterology

## 2021-10-20 DIAGNOSIS — M25512 Pain in left shoulder: Secondary | ICD-10-CM | POA: Diagnosis not present

## 2021-10-20 DIAGNOSIS — M25511 Pain in right shoulder: Secondary | ICD-10-CM | POA: Diagnosis not present

## 2021-10-21 ENCOUNTER — Encounter: Payer: Medicare Other | Admitting: Gastroenterology

## 2021-10-25 ENCOUNTER — Other Ambulatory Visit: Payer: Self-pay | Admitting: Gastroenterology

## 2021-10-25 ENCOUNTER — Encounter: Payer: Self-pay | Admitting: Gastroenterology

## 2021-10-25 ENCOUNTER — Telehealth: Payer: Self-pay | Admitting: Gastroenterology

## 2021-10-25 ENCOUNTER — Ambulatory Visit (AMBULATORY_SURGERY_CENTER): Payer: Medicare Other | Admitting: Gastroenterology

## 2021-10-25 VITALS — BP 150/96 | HR 63 | Temp 97.8°F | Resp 10 | Ht 75.0 in | Wt 253.0 lb

## 2021-10-25 DIAGNOSIS — Z86010 Personal history of colonic polyps: Secondary | ICD-10-CM | POA: Diagnosis not present

## 2021-10-25 DIAGNOSIS — K219 Gastro-esophageal reflux disease without esophagitis: Secondary | ICD-10-CM | POA: Diagnosis not present

## 2021-10-25 DIAGNOSIS — K222 Esophageal obstruction: Secondary | ICD-10-CM | POA: Diagnosis not present

## 2021-10-25 DIAGNOSIS — R1310 Dysphagia, unspecified: Secondary | ICD-10-CM | POA: Diagnosis not present

## 2021-10-25 DIAGNOSIS — Z800 Family history of malignant neoplasm of digestive organs: Secondary | ICD-10-CM

## 2021-10-25 DIAGNOSIS — I10 Essential (primary) hypertension: Secondary | ICD-10-CM | POA: Diagnosis not present

## 2021-10-25 DIAGNOSIS — K2970 Gastritis, unspecified, without bleeding: Secondary | ICD-10-CM | POA: Diagnosis not present

## 2021-10-25 DIAGNOSIS — K319 Disease of stomach and duodenum, unspecified: Secondary | ICD-10-CM | POA: Diagnosis not present

## 2021-10-25 MED ORDER — SODIUM CHLORIDE 0.9 % IV SOLN: 500.0000 mL | Freq: Once | INTRAVENOUS | Status: DC

## 2021-10-25 NOTE — Telephone Encounter (Signed)
Spoke with patient and he was concerned that he did not use the restroom enough after taking the prep. Pt stated that last BM was all liquid, no solid pieces, and looked like gatorade in color. Advised patient that if he is all liquid and clear then he was okay to come in later this evening. Advised patient to continue to drink fluids up until 1130am. Pt verbalized understanding and had no further concerns at the end of the call.  ?

## 2021-10-25 NOTE — Progress Notes (Signed)
To Pacu VSS. Report to [email or web address] ?

## 2021-10-25 NOTE — Progress Notes (Signed)
Vitals-CW  History reviewed. 

## 2021-10-25 NOTE — Op Note (Signed)
[place] ?Patient Name: Jose Bishop ?Procedure Date: 10/25/2021 2:18 PM ?MRN: [id number] ?Endoscopist: Milus Banister , MD ?Age: 56 ?Referring MD:  ?Date of Birth: [birth year] ?Gender: Male ?Account #: [id number] ?Procedure:                Upper GI endoscopy ?Indications:              Dysphagia, GERD, globus ?Medicines:                Monitored Anesthesia Care ?Procedure:                Pre-Anesthesia Assessment: ?                          - Prior to the procedure, a History and Physical  ?                          was performed, and patient medications and  ?                          allergies were reviewed. The patient's tolerance of  ?                          previous anesthesia was also reviewed. The risks  ?                          and benefits of the procedure and the sedation  ?                          options and risks were discussed with the patient.  ?                          All questions were answered, and informed consent  ?                          was obtained. Prior Anticoagulants: The patient has  ?                          taken no previous anticoagulant or antiplatelet  ?                          agents. ASA Grade Assessment: II - A patient with  ?                          mild systemic disease. After reviewing the risks  ?                          and benefits, the patient was deemed in  ?                          satisfactory condition to undergo the procedure. ?                          After obtaining informed consent, the endoscope was  ?  passed under direct vision. Throughout the  ?                          procedure, the patient's blood pressure, pulse, and  ?                          oxygen saturations were monitored continuously. The  ?                          Endoscope was introduced through the mouth, and  ?                          advanced to the second part of duodenum. The upper  ?                          GI endoscopy was accomplished  without difficulty.  ?                          The patient tolerated the procedure well. ?Scope In: ?Scope Out: ?Findings:                 Mild inflammation characterized by erythema and  ?                          friability was found in the gastric antrum.  ?                          Biopsies were taken with a cold forceps for  ?                          histology. ?                          One benign-appearing, intrinsic stenosis was found  ?                          at the gastroesophageal junction (thin Schatzki's  ?                          type ring). Dilation with an 18-19-20 mm TTS  ?                          balloon dilator was performed to 20 mm. ?                          A small hiatal hernia was present. ?                          The exam was otherwise without abnormality. ?Complications:            No immediate complications. Estimated blood loss:  ?                          None. ?Estimated Blood Loss:     Estimated blood loss: none. ?Impression:               -  Mild, non-specific distal gastritis. Biopsied to  ?                          check for H. pylori. ?                          - One benign-appearing, intrinsic stenosis was  ?                          found at the gastroesophageal junction (thin  ?                          Schatzki's type ring). Dilation with an 18-19-20 mm  ?                          TTS balloon dilator was performed to 20 mm. ?                          - Small hiatal hernia. ?                          - The examination was otherwise normal. ?Recommendation:           - Patient has a contact number available for  ?                          emergencies. The signs and symptoms of potential  ?                          delayed complications were discussed with the  ?                          patient. Return to normal activities tomorrow.  ?                          Written discharge instructions were provided to the  ?                          patient. ?                           - Resume previous diet. ?                          - Continue present medications. ?                          - Await pathology results. ?Milus Banister, MD ?10/25/2021 2:50:23 PM ?This report has been signed electronically. ?

## 2021-10-25 NOTE — Patient Instructions (Signed)
Handouts on diverticulosis and gastritis. Await pathology results. ?Resume previous diet and continue present medications. ?Repeat colonoscopy in 5 years for surveillance purposes. ? ? ?YOU HAD AN ENDOSCOPIC PROCEDURE TODAY AT [facility]:   Refer to the procedure report that was given to you for any specific questions about what was found during the examination.  If the procedure report does not answer your questions, please call your gastroenterologist to clarify.  If you requested that your care partner not be given the details of your procedure findings, then the procedure report has been included in a sealed envelope for you to review at your convenience later. ? ?YOU SHOULD EXPECT: Some feelings of bloating in the abdomen. Passage of more gas than usual.  Walking can help get rid of the air that was put into your GI tract during the procedure and reduce the bloating. If you had a lower endoscopy (such as a colonoscopy or flexible sigmoidoscopy) you may notice spotting of blood in your stool or on the toilet paper. If you underwent a bowel prep for your procedure, you may not have a normal bowel movement for a few days. ? ?Please Note:  You might notice some irritation and congestion in your nose or some drainage.  This is from the oxygen used during your procedure.  There is no need for concern and it should clear up in a day or so. ? ?SYMPTOMS TO REPORT IMMEDIATELY: ? ?Following lower endoscopy (colonoscopy or flexible sigmoidoscopy): ? Excessive amounts of blood in the stool ? Significant tenderness or worsening of abdominal pains ? Swelling of the abdomen that is new, acute ? Fever of 100?F or higher ? ?Following upper endoscopy (EGD) ? Vomiting of blood or coffee ground material ? New chest pain or pain under the shoulder blades ? Painful or persistently difficult swallowing ? New shortness of breath ? Fever of 100?F or higher ? Black, tarry-looking stools ? ?For urgent or emergent issues,  a gastroenterologist can be reached at any hour by calling [phone]. ?Do not use MyChart messaging for urgent concerns.  ? ? ?DIET:  We do recommend a small meal at first, but then you may proceed to your regular diet.  Drink plenty of fluids but you should avoid alcoholic beverages for 24 hours. ? ?ACTIVITY:  You should plan to take it easy for the rest of today and you should NOT DRIVE or use heavy machinery until tomorrow (because of the sedation medicines used during the test).   ? ?FOLLOW UP: ?Our staff will call the number listed on your records 48-72 hours following your procedure to check on you and address any questions or concerns that you may have regarding the information given to you following your procedure. If we do not reach you, we will leave a message.  We will attempt to reach you two times.  During this call, we will ask if you have developed any symptoms of COVID 19. If you develop any symptoms (ie: fever, flu-like symptoms, shortness of breath, cough etc.) before then, please call [phone].  If you test positive for Covid 19 in the 2 weeks post procedure, please call and report this information to [place].   ? ?If any biopsies were taken you will be contacted by phone or by letter within the next 1-3 weeks.  Please call us at [phone] if you have not heard about the biopsies in 3 weeks.  ? ? ?SIGNATURES/CONFIDENTIALITY: ?You and/or your care partner have signed  paperwork which will be entered into your electronic medical record.  These signatures attest to the fact that that the information above on your After Visit Summary has been reviewed and is understood.  Full responsibility of the confidentiality of this discharge information lies with you and/or your care-partner.  ?

## 2021-10-25 NOTE — Telephone Encounter (Signed)
After hours call from the patient. He was concerned that he has continued to have liquid bowel movements following his colonoscopy this afternoon. Eating without symptoms. No abdominal pain, bleeding, or systemic complaints. ? ?Reassurance provided. Discussed strategies to avoid dehydration. Offered Metamucil to provided stool bulk if symptoms persist.  ?

## 2021-10-25 NOTE — Op Note (Signed)
[facility] ?Patient Name: Jose Bishop ?Procedure Date: 10/25/2021 2:19 PM ?MRN: [id number] ?Endoscopist: Milus Banister , MD ?Age: 56 ?Referring MD:  ?Date of Birth: 1965-07-02 ?Gender: Male ?Account #: [id number] ?Procedure:                Colonoscopy ?Indications:              Screening in patient at increased risk: Father dx  ?                          with CRC in his 25s. Colonoscopy 2017 two subCM  ?                          adenomas ?Medicines:                Monitored Anesthesia Care ?Procedure:                Pre-Anesthesia Assessment: ?                          - Prior to the procedure, a History and Physical  ?                          was performed, and patient medications and  ?                          allergies were reviewed. The patient's tolerance of  ?                          previous anesthesia was also reviewed. The risks  ?                          and benefits of the procedure and the sedation  ?                          options and risks were discussed with the patient.  ?                          All questions were answered, and informed consent  ?                          was obtained. Prior Anticoagulants: The patient has  ?                          taken no previous anticoagulant or antiplatelet  ?                          agents. ASA Grade Assessment: II - A patient with  ?                          mild systemic disease. After reviewing the risks  ?                          and benefits, the patient was deemed in  ?  satisfactory condition to undergo the procedure. ?                          After obtaining informed consent, the colonoscope  ?                          was passed under direct vision. Throughout the  ?                          procedure, the patient's blood pressure, pulse, and  ?                          oxygen saturations were monitored continuously. The  ?                          CF [id number] #[id number] was introduced through the anus  ?                           and advanced to the the cecum, identified by  ?                          appendiceal orifice and ileocecal valve. The  ?                          colonoscopy was performed without difficulty. The  ?                          patient tolerated the procedure well. The quality  ?                          of the bowel preparation was good. The ileocecal  ?                          valve, appendiceal orifice, and rectum were  ?                          photographed. ?Scope In: 2:24:12 PM ?Scope Out: 2:35:40 PM ?Scope Withdrawal Time: 0 hours 6 minutes 36 seconds  ?Total Procedure Duration: 0 hours 11 minutes 28 seconds  ?Findings:                 Multiple small and large-mouthed diverticula were  ?                          found in the left colon. ?                          The exam was otherwise without abnormality on  ?                          direct and retroflexion views. ?Complications:            No immediate complications. Estimated blood loss:  ?                          None. ?Estimated  Blood Loss:     Estimated blood loss: none. ?Impression:               - Diverticulosis in the left colon. ?                          - The examination was otherwise normal on direct  ?                          and retroflexion views. ?                          - No polyps or cancers. ?Recommendation:           - Repeat colonoscopy in 5 years for screening  ?                          purposes. ?                          - EGD now. ?Milus Banister, MD ?10/25/2021 2:38:08 PM ?This report has been signed electronically. ?

## 2021-10-25 NOTE — Progress Notes (Signed)
Called to room to assist during endoscopic procedure.  Patient ID and intended procedure confirmed with present staff. Received instructions for my participation in the procedure from the performing physician.  

## 2021-10-25 NOTE — Progress Notes (Signed)
HPI: ?This is a man with h/o polyps, GERD ? ?Colonoscopy 2017 two subCM adenomas removed. Father had colon cancer. ? ? ?ROS: complete GI ROS as described in HPI, all other review negative. ? ?Constitutional:  No unintentional weight loss ? ? ?Past Medical History:  ?Diagnosis Date  ? AAA (abdominal aortic aneurysm) ([place])   ? Bradycardia   ? Difficulty sleeping   ? Essential hypertension   ? Family history of stroke   ? Headache   ? History of transient ischemic attack (TIA)   ? [place]  ? Hyperlipidemia   ? Impotence of organic origin   ? Liver cyst   ? Memory loss   ? Osteoarthritis of acromioclavicular joint   ? Prostate cancer ([place])   ? Type 2 diabetes mellitus with hyperglycemia ([facility])   ? ? ?Past Surgical History:  ?Procedure Laterality Date  ? HERNIA REPAIR    ? X2 ( 1 ING HERNIA / 1 UMBILICAL HERNIA )  ? LYMPHADENECTOMY Bilateral 01/04/2015  ? Procedure: BILATERAL LYMPHADENECTOMY;  Surgeon: Raynelle Bring, MD;  Location: [facility];  Service: Urology;  Laterality: Bilateral;  ? MASS EXCISION    ? L UPPER ARM  ? PENILE PROSTHESIS IMPLANT  03/18/2019  ? ROBOT ASSISTED LAPAROSCOPIC RADICAL PROSTATECTOMY N/A 01/04/2015  ? Procedure: ROBOTIC ASSISTED LAPAROSCOPIC RADICAL PROSTATECTOMY LEVEL 2;  Surgeon: Raynelle Bring, MD;  Location: [facility];  Service: Urology;  Laterality: N/A;  ? ROTATOR CUFF REPAIR Bilateral   ? SURGERY SCROTAL / TESTICULAR    ? ? ?Current Outpatient Medications  ?Medication Sig Dispense Refill  ? APPLE CIDER VINEGAR PO Take by mouth.    ? baclofen (LIORESAL) 10 MG tablet Take 1 tablet (10 mg total) by mouth 2 (two) times daily between meals as needed for muscle spasms. 60 each 0  ? cetirizine (ZYRTEC) 10 MG tablet Take 1 tablet (10 mg total) by mouth daily. 30 tablet 11  ? chlorthalidone (HYGROTON) 25 MG tablet TAKE 1 TABLET(25 MG) BY MOUTH DAILY 90 tablet 1  ? Evolocumab with Infusor ([place]) 420 MG/3.5ML SOCT Inject 420 mg into the skin every 30 (thirty) days. 3.6 mL 11  ?  ezetimibe (ZETIA) 10 MG tablet Take 1 tablet (10 mg total) by mouth daily. 90 tablet 3  ? fluticasone (FLONASE) 50 MCG/ACT nasal spray Place 2 sprays into both nostrils daily. 16 g 6  ? gabapentin (NEURONTIN) 300 MG capsule TAKE 1 CAPSULE(300 MG) BY MOUTH AT BEDTIME 30 capsule 0  ? glimepiride (AMARYL) 2 MG tablet Take 1 tablet (2 mg total) by mouth daily with breakfast. 90 tablet 3  ? glucose blood (ONETOUCH VERIO) test strip 1 each by Other route daily. And lancets 1/day 100 each 3  ? lisinopril (ZESTRIL) 30 MG tablet TAKE 1 TABLET(30 MG) BY MOUTH DAILY 90 tablet 1  ? metFORMIN (GLUCOPHAGE-XR) 500 MG 24 hr tablet Take 2 tablets (1,000 mg total) by mouth 2 (two) times daily with a meal. 120 tablet 2  ? omeprazole (PRILOSEC) 40 MG capsule Take 1 capsule (40 mg total) by mouth in the morning. 30 capsule 11  ? Semaglutide, 2 MG/DOSE, (OZEMPIC, 2 MG/DOSE,) 8 MG/3ML SOPN Inject 2 mg into the skin once a week. 9 mL 3  ? ?No current facility-administered medications for this visit.  ? ? ?Allergies as of 10/25/2021 - Review Complete 10/25/2021  ?Allergen Reaction Noted  ? Bee venom Anaphylaxis 11/19/2018  ? Dapagliflozin-metformin hcl er    ? Lipitor [atorvastatin]  11/26/2020  ? ? ?  Family History  ?Problem Relation Age of Onset  ? Anuerysm Mother   ? Stroke Mother   ? Hypertension Father   ? Peripheral vascular disease Father   ? Stroke Father   ? Colon cancer Father   ?     85's  ? Diabetes Sister   ? Heart murmur Sister   ? Heart murmur Son   ? Stomach cancer Neg Hx   ? Rectal cancer Neg Hx   ? Throat cancer Neg Hx   ? ? ?Social History  ? ?Socioeconomic History  ? Marital status: Married  ?  Spouse name: Not on file  ? Number of children: 5  ? Years of education: Not on file  ? Highest education level: Not on file  ?Occupational History  ? Occupation: part time  ?Tobacco Use  ? Smoking status: Never  ? Smokeless tobacco: Never  ?Vaping Use  ? Vaping Use: Never used  ?Substance and Sexual Activity  ? Alcohol use: Yes  ?   Alcohol/week: 0.0 standard drinks  ?  Comment: OCCASIONAL  ? Drug use: No  ? Sexual activity: Yes  ?  Partners: Female  ?Other Topics Concern  ? Not on file  ?Social History Narrative  ? Not on file  ? ?Social Determinants of Health  ? ?Financial Resource Strain: Not on file  ?Food Insecurity: Not on file  ?Transportation Needs: Not on file  ?Physical Activity: Not on file  ?Stress: Not on file  ?Social Connections: Not on file  ?Intimate Partner Violence: Not on file  ? ? ? ?Physical Exam: ?Temp 97.8 ?F (36.6 ?C)  ?Constitutional: generally well-appearing ?Psychiatric: alert and oriented x3 ?Lungs: CTA bilaterally ?Heart: no MCR ? ?Assessment and plan: ?56 y.o. male with h/o polyps, FH CRC (father), globus, dysphagia, GERD ? ?For colonoscopy and EGD today ? ?Care is appropriate for the ambulatory setting. ? ?Owens Loffler, MD ?[facility] ?10/25/2021, 2:10 PM ? ? ? ?

## 2021-10-25 NOTE — Telephone Encounter (Signed)
Inbound call from patient. Have colonoscopy and egd sch 5/2. Reports he have not been using the bathroom like he should and request if he should get something else to help before procedure.  ?

## 2021-10-27 ENCOUNTER — Telehealth: Payer: Self-pay

## 2021-10-27 NOTE — Telephone Encounter (Signed)
?  Follow up Call- ? ? ?  10/25/2021  ?  2:01 PM  ?Call back number  ?Post procedure Call Back phone  # [phone]  ?Permission to leave phone message Yes  ?  ? ?Patient questions: ? ?Do you have a fever, pain , or abdominal swelling? No. ?Pain Score  0 * ? ?Have you tolerated food without any problems? Yes.   ? ?Have you been able to return to your normal activities? Yes.   ? ?Do you have any questions about your discharge instructions: ?Diet   No. ?Medications  No. ?Follow up visit  No. ? ?Do you have questions or concerns about your Care? No. ? ?Actions: ?* If pain score is 4 or above: ?No action needed, pain <4. ? ? ?

## 2021-10-28 ENCOUNTER — Ambulatory Visit (HOSPITAL_COMMUNITY): Payer: Self-pay | Admitting: Licensed Clinical Social Worker

## 2021-10-31 ENCOUNTER — Encounter: Payer: Self-pay | Admitting: Gastroenterology

## 2021-10-31 ENCOUNTER — Telehealth: Payer: Self-pay | Admitting: Cardiovascular Disease

## 2021-10-31 NOTE — Telephone Encounter (Signed)
Patient has questions bout the paperwork she received for the mediation Evolocumab with Infusor ([place]) 420 MG/3.5ML SOCT. Please advise ?

## 2021-11-01 NOTE — Telephone Encounter (Signed)
Lmom the pt to call back asap  ?

## 2021-11-01 NOTE — Telephone Encounter (Signed)
Pt called in and stated that the pharmacy was still charging them for the repatha when it was supposed to be free. So we decided to 3way called the pharmacy and provided hwf information to bring the cost down to $0 ?

## 2021-11-11 ENCOUNTER — Ambulatory Visit: Payer: Medicare Other | Admitting: Nurse Practitioner

## 2021-11-15 ENCOUNTER — Other Ambulatory Visit: Payer: Self-pay | Admitting: Family Medicine

## 2021-11-15 DIAGNOSIS — I10 Essential (primary) hypertension: Secondary | ICD-10-CM

## 2021-12-01 ENCOUNTER — Ambulatory Visit (INDEPENDENT_AMBULATORY_CARE_PROVIDER_SITE_OTHER): Payer: Medicare Other

## 2021-12-01 DIAGNOSIS — Z0000 Encounter for general adult medical examination without abnormal findings: Secondary | ICD-10-CM

## 2021-12-01 NOTE — Progress Notes (Addendum)
Subjective:   Jose Bishop is a 56 y.o. male who presents for Medicare Annual/Subsequent preventive examination. I discussed the limitations of evaluation and management by telemedicine and the availability of in person appointments. The patient expressed understanding and agreed to proceed.   Visit performed by audio   Patient location: Home  Provider location: Home  Review of Systems    N/A       Objective:    Today's Vitals   12/01/21 1148  PainSc: 7    There is no height or weight on file to calculate BMI.     12/01/2021   12:15 PM 04/30/2019    2:24 PM 01/26/2016    9:42 AM 01/04/2015    4:23 PM 01/04/2015    9:52 AM 12/30/2014    2:19 PM 10/02/2014   12:49 PM  Advanced Directives  Does Patient Have a Medical Advance Directive? Yes No No No  No No  Type of Advance Directive Living will        Would patient like information on creating a medical advance directive?  No - Patient declined No - patient declined information No - patient declined information No - patient declined information No - patient declined information No - patient declined information    Current Medications (verified) Outpatient Encounter Medications as of 12/01/2021  Medication Sig   APPLE CIDER VINEGAR PO Take by mouth.   baclofen (LIORESAL) 10 MG tablet Take 1 tablet (10 mg total) by mouth 2 (two) times daily between meals as needed for muscle spasms.   cetirizine (ZYRTEC) 10 MG tablet Take 1 tablet (10 mg total) by mouth daily.   chlorthalidone (HYGROTON) 25 MG tablet TAKE 1 TABLET(25 MG) BY MOUTH DAILY   Evolocumab with Infusor (REPATHA PUSHTRONEX SYSTEM) 420 MG/3.5ML SOCT Inject 420 mg into the skin every 30 (thirty) days.   ezetimibe (ZETIA) 10 MG tablet Take 1 tablet (10 mg total) by mouth daily.   fluticasone (FLONASE) 50 MCG/ACT nasal spray Place 2 sprays into both nostrils daily.   gabapentin (NEURONTIN) 300 MG capsule TAKE 1 CAPSULE(300 MG) BY MOUTH AT BEDTIME   glimepiride (AMARYL) 2 MG  tablet Take 1 tablet (2 mg total) by mouth daily with breakfast.   glucose blood (ONETOUCH VERIO) test strip 1 each by Other route daily. And lancets 1/day   latanoprost (XALATAN) 0.005 % ophthalmic solution Place 1 drop into both eyes at bedtime.   lisinopril (ZESTRIL) 30 MG tablet TAKE 1 TABLET(30 MG) BY MOUTH DAILY   metFORMIN (GLUCOPHAGE-XR) 500 MG 24 hr tablet Take 2 tablets (1,000 mg total) by mouth 2 (two) times daily with a meal.   omeprazole (PRILOSEC) 40 MG capsule Take 1 capsule (40 mg total) by mouth in the morning.   Semaglutide, 2 MG/DOSE, (OZEMPIC, 2 MG/DOSE,) 8 MG/3ML SOPN Inject 2 mg into the skin once a week.   No facility-administered encounter medications on file as of 12/01/2021.    Allergies (verified) Bee venom, Dapagliflozin-metformin hcl er, and Lipitor [atorvastatin]   History: Past Medical History:  Diagnosis Date   AAA (abdominal aortic aneurysm) (HCC)    Anxiety    Bradycardia    Bradycardia    Difficulty sleeping    Essential hypertension    Family history of stroke    Headache    History of transient ischemic attack (TIA)    15 YRS AGO   Hyperlipidemia    Impotence of organic origin    Liver cyst    Memory loss  Osteoarthritis of acromioclavicular joint    Prostate cancer ([place])    PVC (premature ventricular contraction)    Type 2 diabetes mellitus with hyperglycemia ([place])    Past Surgical History:  Procedure Laterality Date   HERNIA REPAIR     X2 ( 1 ING HERNIA / 1 UMBILICAL HERNIA )   LYMPHADENECTOMY Bilateral 01/04/2015   Procedure: BILATERAL LYMPHADENECTOMY;  Surgeon: Raynelle Bring, MD;  Location: [facility];  Service: Urology;  Laterality: Bilateral;   MASS EXCISION     L UPPER ARM   PENILE PROSTHESIS IMPLANT  03/18/2019   ROBOT ASSISTED LAPAROSCOPIC RADICAL PROSTATECTOMY N/A 01/04/2015   Procedure: ROBOTIC ASSISTED LAPAROSCOPIC RADICAL PROSTATECTOMY LEVEL 2;  Surgeon: Raynelle Bring, MD;  Location: [facility];  Service: Urology;  Laterality: N/A;    ROTATOR CUFF REPAIR Bilateral    SURGERY SCROTAL / TESTICULAR     Family History  Problem Relation Age of Onset   Anuerysm Mother    Stroke Mother    Hypertension Father    Peripheral vascular disease Father    Stroke Father    Colon cancer Father        61's   Diabetes Sister    Heart murmur Sister    Heart murmur Son    Stomach cancer Neg Hx    Rectal cancer Neg Hx    Throat cancer Neg Hx    Social History   Socioeconomic History   Marital status: Married    Spouse name: Not on file   Number of children: 5   Years of education: Not on file   Highest education level: Associate degree: occupational, Hotel manager, or vocational program  Occupational History   Occupation: part time  Tobacco Use   Smoking status: Never   Smokeless tobacco: Never  Vaping Use   Vaping Use: Never used  Substance and Sexual Activity   Alcohol use: Yes    Alcohol/week: 0.0 standard drinks of alcohol    Comment: OCCASIONAL   Drug use: No   Sexual activity: Yes    Partners: Female  Other Topics Concern   Not on file  Social History Narrative   Not on file   Social Determinants of Health   Financial Resource Strain: Low Risk  (12/01/2021)   Overall Financial Resource Strain (CARDIA)    Difficulty of Paying Living Expenses: Not very hard  Food Insecurity: No Food Insecurity (12/01/2021)   Hunger Vital Sign    Worried About Running Out of Food in the Last Year: Never true    Ran Out of Food in the Last Year: Never true  Transportation Needs: No Transportation Needs (12/01/2021)   PRAPARE - Hydrologist (Medical): No    Lack of Transportation (Non-Medical): No  Physical Activity: Insufficiently Active (12/01/2021)   Exercise Vital Sign    Days of Exercise per Week: 3 days    Minutes of Exercise per Session: 20 min  Stress: Stress Concern Present (12/01/2021)   [place]    Feeling of Stress : To  some extent  Social Connections: Moderately Integrated (12/01/2021)   Social Connection and Isolation Panel [NHANES]    Frequency of Communication with Friends and Family: Once a week    Frequency of Social Gatherings with Friends and Family: Once a week    Attends Religious Services: More than 4 times per year    Active Member of [employer] or Organizations: Yes    Attends Archivist  Meetings: More than 4 times per year    Marital Status: Married    Tobacco Counseling Counseling given: Not Answered   Clinical Intake:  Pre-visit preparation completed: Yes  Pain : 0-10 Pain Score: 7  Pain Type: Chronic pain Pain Location: Back Pain Orientation: Lower, Mid Pain Radiating Towards: Neck and hips Pain Descriptors / Indicators: Aching, Spasm, Burning Pain Onset: More than a month ago Pain Frequency: Constant Pain Relieving Factors: Aleve and Tylenol  Pain Relieving Factors: Aleve and Tylenol  Diabetes: Yes  How often do you need to have someone help you when you read instructions, pamphlets, or other written materials from your doctor or pharmacy?: 1 - Never What is the last grade level you completed in school?: 2 years of college  Diabetic?Yes  Interpreter Needed?: No  Information entered by :: Anson [place] CMA   Activities of Daily Living    12/01/2021   12:16 PM  In your present state of health, do you have any difficulty performing the following activities:  Hearing? 0  Vision? 0  Difficulty concentrating or making decisions? 1  Comment at times due to past strokes  Walking or climbing stairs? 1  Comment due to back pain  Dressing or bathing? 0  Preparing Food and eating ? N  Using the Toilet? N  In the past six months, have you accidently leaked urine? Y  Comment Pt has hx of prostate cancer  Do you have problems with loss of bowel control? N  Managing your Medications? N  Managing your Finances? N  Housekeeping or managing your Housekeeping? N     Patient Care Team: Charlott Rakes, MD as PCP - General (Family Medicine) Lorretta Harp, MD as PCP - Cardiology (Cardiology)  Indicate any recent Medical Services you may have received from other than [facility] providers in the past year (date may be approximate).     Assessment:   This is a routine wellness examination for Gent[place].  Hearing/Vision screen No results found.  Dietary issues and exercise activities discussed:     Goals Addressed   None    Depression Screen    12/01/2021   12:08 PM 03/03/2021   11:46 AM 01/07/2021    3:25 PM 12/07/2020    9:43 AM 10/27/2020   11:24 AM 10/19/2020   12:07 PM 04/12/2020   10:26 AM  PHQ 2/9 Scores  PHQ - 2 Score 0 2 0 '4 4 6   '$ PHQ- 9 Score  11 0 '13 15 14   '$ Exception Documentation       Other- indicate reason in comment box    Fall Risk    12/01/2021   12:16 PM 08/22/2021   10:54 AM 05/25/2021   10:37 AM 10/19/2020   12:05 PM  [place] in the past year? 0 0 0 0  Number falls in past yr: 0 0 0 0  Injury with Fall? 0 0 0 0  Risk for fall due to : No Fall Risks   No Fall Risks  Follow up Falls evaluation completed   Falls evaluation completed    [place]:  Any stairs in or around the home? Yes  If so, are there any without handrails? No  Home free of loose throw rugs in walkways, pet beds, electrical cords, etc? Yes  Adequate lighting in your home to reduce risk of falls? Yes   ASSISTIVE DEVICES UTILIZED TO PREVENT FALLS:  Life alert? No  Use  of a cane, walker or w/c? No  Grab bars in the bathroom? No  Shower chair or bench in shower? Yes  Elevated toilet seat or a handicapped toilet? No   TIMED UP AND GO:  Was the test performed? No .  Length of time to ambulate 10 feet: 0 sec.     Cognitive Function:        12/01/2021   12:19 PM  6CIT Screen  What Year? 0 points  What month? 0 points  What time? 0 points  Count back from 20 0 points  Months in reverse 0 points   Repeat phrase 0 points  Total Score 0 points    Immunizations Immunization History  Administered Date(s) Administered   PFIZER(Purple Top)SARS-COV-2 Vaccination 08/30/2019, 09/20/2019   PNEUMOCOCCAL CONJUGATE-20 05/25/2021   Pneumococcal Polysaccharide-23 01/06/2020    TDAP status: Due, Education has been provided regarding the importance of this vaccine. Advised may receive this vaccine at local pharmacy or Health Dept. Aware to provide a copy of the vaccination record if obtained from local pharmacy or Health Dept. Verbalized acceptance and understanding.  Flu Vaccine status: Up to date  Pneumococcal vaccine status: Due, Education has been provided regarding the importance of this vaccine. Advised may receive this vaccine at local pharmacy or Health Dept. Aware to provide a copy of the vaccination record if obtained from local pharmacy or Health Dept. Verbalized acceptance and understanding.  Covid-19 vaccine status: Information provided on how to obtain vaccines.   Qualifies for Shingles Vaccine? Yes   Zostavax completed No   Shingrix Completed?: No.    Education has been provided regarding the importance of this vaccine. Patient has been advised to call insurance company to determine out of pocket expense if they have not yet received this vaccine. Advised may also receive vaccine at local pharmacy or Health Dept. Verbalized acceptance and understanding.  Screening Tests Health Maintenance  Topic Date Due   TETANUS/TDAP  Never done   Zoster Vaccines- Shingrix (1 of 2) Never done   COVID-19 Vaccine (3 - Pfizer risk series) 10/18/2019   OPHTHALMOLOGY EXAM  04/22/2021   HEMOGLOBIN A1C  01/16/2022   INFLUENZA VACCINE  01/24/2022   FOOT EXAM  04/18/2022   COLONOSCOPY (Pts 45-34yr Insurance coverage will need to be confirmed)  10/26/2026   Hepatitis C Screening  Completed   HIV Screening  Completed   HPV VACCINES  Aged Out    Health Maintenance  Health Maintenance Due   Topic Date Due   TETANUS/TDAP  Never done   Zoster Vaccines- Shingrix (1 of 2) Never done   COVID-19 Vaccine (3 - Pfizer risk series) 10/18/2019   OPHTHALMOLOGY EXAM  04/22/2021    Colorectal cancer screening: Type of screening: Colonoscopy. Completed yes. Repeat every 5 years  Lung Cancer Screening: (Low Dose CT Chest recommended if Age 55-80years, 30 pack-year currently smoking OR have quit w/in 15years.) does not qualify.   Lung Cancer Screening Referral: No  Additional Screening:  Hepatitis C Screening: does qualify; Completed Yes  Vision Screening: Recommended annual ophthalmology exams for early detection of glaucoma and other disorders of the eye. Is the patient up to date with their annual eye exam?  Yes  Who is the provider or what is the name of the office in which the patient attends annual eye exams? Dr. GKaty FitchIf pt is not established with a provider, would they like to be referred to a provider to establish care? No .   Dental Screening: Recommended  annual dental exams for proper oral hygiene  Community Resource Referral / Chronic Care Management: CRR required this visit?  No   CCM required this visit?  No      Plan:     I have personally reviewed and noted the following in the patient's chart:   Medical and social history Use of alcohol, tobacco or illicit drugs  Current medications and supplements including opioid prescriptions. Patient is not currently taking opioid prescriptions. Functional ability and status Nutritional status Physical activity Advanced directives List of other physicians Hospitalizations, surgeries, and ER visits in previous 12 months Vitals Screenings to include cognitive, depression, and falls Referrals and appointments  In addition, I have reviewed and discussed with patient certain preventive protocols, quality metrics, and best practice recommendations. A written personalized care plan for preventive services as well as  general preventive health recommendations were provided to patient.    Mr. Misko , Thank you for taking time to come for your Medicare Wellness Visit. I appreciate your ongoing commitment to your health goals. Please review the following plan we discussed and let me know if I can assist you in the future.   These are the goals we discussed:  Goals      Blood Pressure < 130/80     LDL CALC < 70        This is a list of the screening recommended for you and due dates:  Health Maintenance  Topic Date Due   Tetanus Vaccine  Never done   Zoster (Shingles) Vaccine (1 of 2) Never done   COVID-19 Vaccine (3 - Pfizer risk series) 10/18/2019   Eye exam for diabetics  04/22/2021   Hemoglobin A1C  01/16/2022   Flu Shot  01/24/2022   Complete foot exam   04/18/2022   Colon Cancer Screening  10/26/2026   Hepatitis C Screening: USPSTF Recommendation to screen - Ages 18-79 yo.  Completed   HIV Screening  Completed   HPV Vaccine  Aged [address], [place]   12/01/2021   Nurse Notes: Patient is in need of a new referral for endocrinology and psychiatric due to his previous doctors leaving. Mr. Montfort also needs a medication refill on his ozempic and Repatha. Patient's pharmacy has been updated in his chart. He is positive he has had the tetanus and pneumonia vaccine at the urgent care on [address]  in [place]. I checked the vaccine records and did not find them. Patient was informed to contact them and call Dr. Smitty Pluck office with these dates.  I have reviewed and agreed to the above documentation.   Theresia Lo, FNP

## 2021-12-10 ENCOUNTER — Other Ambulatory Visit: Payer: Self-pay | Admitting: Cardiovascular Disease

## 2021-12-10 DIAGNOSIS — E782 Mixed hyperlipidemia: Secondary | ICD-10-CM

## 2021-12-12 DIAGNOSIS — M47896 Other spondylosis, lumbar region: Secondary | ICD-10-CM | POA: Diagnosis not present

## 2021-12-12 DIAGNOSIS — M5136 Other intervertebral disc degeneration, lumbar region: Secondary | ICD-10-CM | POA: Diagnosis not present

## 2021-12-14 DIAGNOSIS — M5451 Vertebrogenic low back pain: Secondary | ICD-10-CM | POA: Diagnosis not present

## 2021-12-14 DIAGNOSIS — M5136 Other intervertebral disc degeneration, lumbar region: Secondary | ICD-10-CM | POA: Diagnosis not present

## 2021-12-21 DIAGNOSIS — E785 Hyperlipidemia, unspecified: Secondary | ICD-10-CM | POA: Diagnosis not present

## 2021-12-22 LAB — LIPID PANEL
Chol/HDL Ratio: 2.5 ratio (ref 0.0–5.0)
Cholesterol, Total: 150 mg/dL (ref 100–199)
HDL: 59 mg/dL (ref >39)
LDL Chol Calc (NIH): 71 mg/dL (ref 0–99)
Triglycerides: 113 mg/dL (ref 0–149)
VLDL Cholesterol Cal: 20 mg/dL (ref 5–40)

## 2021-12-22 LAB — HEPATIC FUNCTION PANEL
ALT: 21 IU/L (ref 0–44)
AST: 19 IU/L (ref 0–40)
Albumin: 4.4 g/dL (ref 3.8–4.9)
Alkaline Phosphatase: 42 IU/L — ABNORMAL LOW (ref 44–121)
Bilirubin Total: 0.7 mg/dL (ref 0.0–1.2)
Bilirubin, Direct: 0.18 mg/dL (ref 0.00–0.40)
Total Protein: 7.5 g/dL (ref 6.0–8.5)

## 2022-02-11 ENCOUNTER — Other Ambulatory Visit: Payer: Self-pay | Admitting: Family Medicine

## 2022-02-11 DIAGNOSIS — I10 Essential (primary) hypertension: Secondary | ICD-10-CM

## 2022-02-14 ENCOUNTER — Other Ambulatory Visit: Payer: Self-pay | Admitting: Family Medicine

## 2022-02-14 DIAGNOSIS — E119 Type 2 diabetes mellitus without complications: Secondary | ICD-10-CM

## 2022-02-15 NOTE — Telephone Encounter (Signed)
Requested medications are due for refill today.  unsure  Requested medications are on the active medications list.  yes  Last refill. 12/07/2020 #120 2 refills  Future visit scheduled.   yes  Notes to clinic.  Rx signed by Durene Fruits. Rx written to expire 12/01/2021. Rx is expired.    Requested Prescriptions  Pending Prescriptions Disp Refills   metFORMIN (GLUCOPHAGE-XR) 500 MG 24 hr tablet [Pharmacy Med Name: METFORMIN ER 500MG 24HR TABS] 120 tablet 2    Sig: TAKE 2 TABLETS(1000 MG) BY MOUTH IN THE MORNING AND AT BEDTIME     Endocrinology:  Diabetes - Biguanides Failed - 02/14/2022  9:37 AM      Failed - HBA1C is between 0 and 7.9 and within 180 days    Hemoglobin A1C  Date Value Ref Range Status  07/19/2021 9.6 (A) 4.0 - 5.6 % Final   Hgb A1c MFr Bld  Date Value Ref Range Status  01/28/2021 9.5 (H) 4.8 - 5.6 % Final    Comment:             Prediabetes: 5.7 - 6.4          Diabetes: >6.4          Glycemic control for adults with diabetes: <7.0          Failed - B12 Level in normal range and within 720 days    No results found for: "VITAMINB12"       Failed - CBC within normal limits and completed in the last 12 months    WBC  Date Value Ref Range Status  01/07/2021 5.0 3.4 - 10.8 x10E3/uL Final  04/30/2019 4.4 4.0 - 10.5 K/uL Final   RBC  Date Value Ref Range Status  01/07/2021 4.70 4.14 - 5.80 x10E6/uL Final  04/30/2019 4.80 4.22 - 5.81 MIL/uL Final   Hemoglobin  Date Value Ref Range Status  01/07/2021 14.2 13.0 - 17.7 g/dL Final   Hematocrit  Date Value Ref Range Status  01/07/2021 42.9 37.5 - 51.0 % Final   MCHC  Date Value Ref Range Status  01/07/2021 33.1 31.5 - 35.7 g/dL Final  04/30/2019 32.7 30.0 - 36.0 g/dL Final   [facility]  Date Value Ref Range Status  01/07/2021 30.2 26.6 - 33.0 pg Final  04/30/2019 30.4 26.0 - 34.0 pg Final   MCV  Date Value Ref Range Status  01/07/2021 91 79 - 97 fL Final   No results found for: "PLTCOUNTKUC", "LABPLAT",  "POCPLA" RDW  Date Value Ref Range Status  01/07/2021 12.8 11.6 - 15.4 % Final         Passed - Cr in normal range and within 360 days    Creat  Date Value Ref Range Status  10/02/2014 1.03 0.50 - 1.35 mg/dL Final   Creatinine, Ser  Date Value Ref Range Status  08/22/2021 1.07 0.76 - 1.27 mg/dL Final         Passed - eGFR in normal range and within 360 days    GFR calc Af Amer  Date Value Ref Range Status  04/14/2020 95 >59 mL/min/1.73 Final    Comment:    **In accordance with recommendations from the NKF-ASN Task force,**   [facility] is in the process of updating its eGFR calculation to the   2021 CKD-EPI creatinine equation that estimates kidney function   without a race variable.    GFR calc non Af Amer  Date Value Ref Range Status  04/14/2020 83 >59 mL/min/1.73 Final  eGFR  Date Value Ref Range Status  08/22/2021 82 >59 mL/min/1.73 Final         Passed - Valid encounter within last 6 months    Recent Outpatient Visits           5 months ago Type 2 diabetes mellitus with hyperglycemia, with long-term current use of insulin ([place])   [place], [place], MD   8 months ago Type 2 diabetes mellitus with hyperglycemia, with long-term current use of insulin ([place])   [place], [place], MD   10 months ago Acute non-recurrent maxillary sinusitis   [facility] at [facility], Kriste Basque, NP   1 year ago Annual physical exam   Primary Care at [facility], [place], NP   1 year ago Essential hypertension   Primary Care at [facility], Flonnie [place], NP       Future Appointments             In 1 week Charlott Rakes, MD [place]

## 2022-02-22 ENCOUNTER — Encounter: Payer: Self-pay | Admitting: Family Medicine

## 2022-02-22 ENCOUNTER — Ambulatory Visit: Payer: Medicare Other | Attending: Family Medicine | Admitting: Family Medicine

## 2022-02-22 ENCOUNTER — Other Ambulatory Visit: Payer: Self-pay | Admitting: Family Medicine

## 2022-02-22 VITALS — BP 112/73 | HR 74 | Temp 98.2°F | Ht 75.0 in | Wt 246.8 lb

## 2022-02-22 DIAGNOSIS — E1165 Type 2 diabetes mellitus with hyperglycemia: Secondary | ICD-10-CM

## 2022-02-22 DIAGNOSIS — M5136 Other intervertebral disc degeneration, lumbar region: Secondary | ICD-10-CM

## 2022-02-22 DIAGNOSIS — Z23 Encounter for immunization: Secondary | ICD-10-CM | POA: Diagnosis not present

## 2022-02-22 DIAGNOSIS — E1169 Type 2 diabetes mellitus with other specified complication: Secondary | ICD-10-CM

## 2022-02-22 DIAGNOSIS — I152 Hypertension secondary to endocrine disorders: Secondary | ICD-10-CM | POA: Diagnosis not present

## 2022-02-22 DIAGNOSIS — Z794 Long term (current) use of insulin: Secondary | ICD-10-CM | POA: Diagnosis not present

## 2022-02-22 DIAGNOSIS — E785 Hyperlipidemia, unspecified: Secondary | ICD-10-CM

## 2022-02-22 DIAGNOSIS — E1159 Type 2 diabetes mellitus with other circulatory complications: Secondary | ICD-10-CM | POA: Diagnosis not present

## 2022-02-22 LAB — POCT GLYCOSYLATED HEMOGLOBIN (HGB A1C): HbA1c, POC (controlled diabetic range): 8.9 % — AB (ref 0.0–7.0)

## 2022-02-22 LAB — GLUCOSE, POCT (MANUAL RESULT ENTRY): POC Glucose: 191 mg/dl — AB (ref 70–99)

## 2022-02-22 MED ORDER — OZEMPIC (2 MG/DOSE) 8 MG/3ML ~~LOC~~ SOPN: 2.0000 mg | PEN_INJECTOR | SUBCUTANEOUS | 1 refills | Status: DC

## 2022-02-22 MED ORDER — GLIMEPIRIDE 2 MG PO TABS: 2.0000 mg | ORAL_TABLET | Freq: Every day | ORAL | 1 refills | Status: DC

## 2022-02-22 MED ORDER — METFORMIN HCL ER 500 MG PO TB24: ORAL_TABLET | ORAL | 1 refills | Status: DC

## 2022-02-22 NOTE — Progress Notes (Signed)
Subjective:  Patient ID: Jose Bishop, male    DOB: [birth year]  Age: 56 y.o. MRN: [id number]  CC: Diabetes   HPI Jose Bishop is a 56 y.o. year old male with a history of type 2 diabetes (A1c 9.2 managed by endocrine), hypertension, depression, DDD of lumbar spine, dilatation and ascending aorta  Interval History: His lumbar spine degenerative disc disease is managed by [facility] with his last visit 2 months ago. He states they have discussed a back stimulator for his pain and he is researching that some more. Seen by the cardiology PA in 09/2021 and per notes no anginal symptoms, no indication for ischemic work-up. He is still on Repatha for his cholesterol.  For his ascending aorta dilatation last CTA was in 10/2020 which revealed a diameter of 3.8 cm.  He has not followed up with vascular.  Previously followed by Dr Loanne Drilling, Endocrine who left his practice and since then he has not been assigned anyone.  His sugars have been running around 134 -180. He Complains of diarrhea with Metformin patient complains of indigestion with Ozempic.  He is not open to taking insulin. Past Medical History:  Diagnosis Date   AAA (abdominal aortic aneurysm) (HCC)    Anxiety    Bradycardia    Bradycardia    Difficulty sleeping    Essential hypertension    Family history of stroke    Headache    History of transient ischemic attack (TIA)    15 YRS AGO   Hyperlipidemia    Impotence of organic origin    Liver cyst    Memory loss    Osteoarthritis of acromioclavicular joint    Prostate cancer (HCC)    PVC (premature ventricular contraction)    Type 2 diabetes mellitus with hyperglycemia ([place])     Past Surgical History:  Procedure Laterality Date   HERNIA REPAIR     X2 ( 1 ING HERNIA / 1 UMBILICAL HERNIA )   LYMPHADENECTOMY Bilateral 01/04/2015   Procedure: BILATERAL LYMPHADENECTOMY;  Surgeon: Raynelle Bring, MD;  Location: [facility];  Service: Urology;  Laterality: Bilateral;   MASS  EXCISION     L UPPER ARM   PENILE PROSTHESIS IMPLANT  03/18/2019   ROBOT ASSISTED LAPAROSCOPIC RADICAL PROSTATECTOMY N/A 01/04/2015   Procedure: ROBOTIC ASSISTED LAPAROSCOPIC RADICAL PROSTATECTOMY LEVEL 2;  Surgeon: Raynelle Bring, MD;  Location: [facility];  Service: Urology;  Laterality: N/A;   ROTATOR CUFF REPAIR Bilateral    SURGERY SCROTAL / TESTICULAR      Family History  Problem Relation Age of Onset   Anuerysm Mother    Stroke Mother    Hypertension Father    Peripheral vascular disease Father    Stroke Father    Colon cancer Father        87's   Diabetes Sister    Heart murmur Sister    Heart murmur Son    Stomach cancer Neg Hx    Rectal cancer Neg Hx    Throat cancer Neg Hx     Social History   Socioeconomic History   Marital status: Married    Spouse name: Not on file   Number of children: 5   Years of education: Not on file   Highest education level: Associate degree: occupational, Hotel manager, or vocational program  Occupational History   Occupation: part time  Tobacco Use   Smoking status: Never   Smokeless tobacco: Never  Vaping Use   Vaping Use: Never used  Substance and  Sexual Activity   Alcohol use: Yes    Alcohol/week: 0.0 standard drinks of alcohol    Comment: OCCASIONAL   Drug use: No   Sexual activity: Yes    Partners: Female  Other Topics Concern   Not on file  Social History Narrative   Not on file   Social Determinants of Health   Financial Resource Strain: Low Risk  (12/01/2021)   Overall Financial Resource Strain (CARDIA)    Difficulty of Paying Living Expenses: Not very hard  Food Insecurity: No Food Insecurity (12/01/2021)   Hunger Vital Sign    Worried About Running Out of Food in the Last Year: Never true    Ran Out of Food in the Last Year: Never true  Transportation Needs: No Transportation Needs (12/01/2021)   PRAPARE - Hydrologist (Medical): No    Lack of Transportation (Non-Medical): No  Physical  Activity: Insufficiently Active (12/01/2021)   Exercise Vital Sign    Days of Exercise per Week: 3 days    Minutes of Exercise per Session: 20 min  Stress: Stress Concern Present (12/01/2021)   [place]    Feeling of Stress : To some extent  Social Connections: Moderately Integrated (12/01/2021)   Social Connection and Isolation Panel [NHANES]    Frequency of Communication with Friends and Family: Once a week    Frequency of Social Gatherings with Friends and Family: Once a week    Attends Religious Services: More than 4 times per year    Active Member of [employer] or Organizations: Yes    Attends Music therapist: More than 4 times per year    Marital Status: Married    Allergies  Allergen Reactions   Bee Venom Anaphylaxis   Dapagliflozin-Metformin Hcl Er    Lipitor [Atorvastatin]     FATIGUED NAUSEA BRAIN FOG    Outpatient Medications Prior to Visit  Medication Sig Dispense Refill   APPLE CIDER VINEGAR PO Take by mouth.     baclofen (LIORESAL) 10 MG tablet Take 1 tablet (10 mg total) by mouth 2 (two) times daily between meals as needed for muscle spasms. 60 each 0   cetirizine (ZYRTEC) 10 MG tablet Take 1 tablet (10 mg total) by mouth daily. 30 tablet 11   chlorthalidone (HYGROTON) 25 MG tablet TAKE 1 TABLET(25 MG) BY MOUTH DAILY 90 tablet 1   Evolocumab with Infusor ([place]) 420 MG/3.5ML SOCT Inject 420 mg into the skin every 30 (thirty) days. 3.6 mL 11   ezetimibe (ZETIA) 10 MG tablet TAKE 1 TABLET(10 MG) BY MOUTH DAILY 90 tablet 3   fluticasone (FLONASE) 50 MCG/ACT nasal spray Place 2 sprays into both nostrils daily. 16 g 6   gabapentin (NEURONTIN) 300 MG capsule TAKE 1 CAPSULE(300 MG) BY MOUTH AT BEDTIME 30 capsule 0   glucose blood (ONETOUCH VERIO) test strip 1 each by Other route daily. And lancets 1/day 100 each 3   latanoprost (XALATAN) 0.005 % ophthalmic solution Place 1 drop into  both eyes at bedtime.     lisinopril (ZESTRIL) 30 MG tablet TAKE 1 TABLET(30 MG) BY MOUTH DAILY 90 tablet 1   omeprazole (PRILOSEC) 40 MG capsule Take 1 capsule (40 mg total) by mouth in the morning. 30 capsule 11   glimepiride (AMARYL) 2 MG tablet Take 1 tablet (2 mg total) by mouth daily with breakfast. 90 tablet 3   metFORMIN (GLUCOPHAGE-XR) 500 MG 24 hr tablet  TAKE 2 TABLETS(1000 MG) BY MOUTH IN THE MORNING AND AT BEDTIME 120 tablet 0   Semaglutide, 2 MG/DOSE, (OZEMPIC, 2 MG/DOSE,) 8 MG/3ML SOPN Inject 2 mg into the skin once a week. 9 mL 3   No facility-administered medications prior to visit.     ROS Review of Systems  Constitutional:  Negative for activity change and appetite change.  HENT:  Negative for sinus pressure and sore throat.   Respiratory:  Negative for chest tightness, shortness of breath and wheezing.   Cardiovascular:  Negative for chest pain and palpitations.  Gastrointestinal:  Negative for abdominal distention, abdominal pain and constipation.  Genitourinary: Negative.   Musculoskeletal: Negative.   Psychiatric/Behavioral:  Negative for behavioral problems and dysphoric mood.     Objective:  BP 112/73   Pulse 74   Temp 98.2 F (36.8 C) (Oral)   Ht '6\' 3"'$  (1.905 m)   Wt 246 lb 12.8 oz (111.9 kg)   SpO2 98%   BMI 30.85 kg/m      02/22/2022    9:35 AM 10/25/2021    3:10 PM 10/25/2021    3:00 PM  BP/Weight  Systolic BP 916 945 038  Diastolic BP 73 96 88  Wt. (Lbs) 246.8    BMI 30.85 kg/m2        Physical Exam Constitutional:      Appearance: He is well-developed. He is obese.  Cardiovascular:     Rate and Rhythm: Normal rate.     Heart sounds: Normal heart sounds. No murmur heard. Pulmonary:     Effort: Pulmonary effort is normal.     Breath sounds: Normal breath sounds. No wheezing or rales.  Chest:     Chest wall: No tenderness.  Abdominal:     General: Bowel sounds are normal. There is no distension.     Palpations: Abdomen is soft. There  is no mass.     Tenderness: There is no abdominal tenderness.  Musculoskeletal:        General: Normal range of motion.     Right lower leg: No edema.     Left lower leg: No edema.  Neurological:     Mental Status: He is alert and oriented to person, place, and time.  Psychiatric:        Mood and Affect: Mood normal.        Latest Ref Rng & Units 12/21/2021    9:07 AM 08/22/2021   11:53 AM 01/28/2021    2:35 PM  CMP  Glucose 70 - 99 mg/dL  132    BUN 6 - 24 mg/dL  16    Creatinine 0.76 - 1.27 mg/dL  1.07    Sodium 134 - 144 mmol/L  135    Potassium 3.5 - 5.2 mmol/L  4.4    Chloride 96 - 106 mmol/L  96    CO2 20 - 29 mmol/L  25    Calcium 8.7 - 10.2 mg/dL  9.6    Total Protein 6.0 - 8.5 g/dL 7.5  7.5  7.8   Total Bilirubin 0.0 - 1.2 mg/dL 0.7  0.5  0.8   Alkaline Phos 44 - 121 IU/L 42  49  55   AST 0 - 40 IU/L '19  16  21   '$ ALT 0 - 44 IU/L '21  18  20     '$ Lipid Panel     Component Value Date/Time   CHOL 150 12/21/2021 0906   TRIG 113 12/21/2021 0906   HDL 59  12/21/2021 0906   CHOLHDL 2.5 12/21/2021 0906   LDLCALC 71 12/21/2021 0906   LDLDIRECT 145 (H) 01/06/2020 1046    CBC    Component Value Date/Time   WBC 5.0 01/07/2021 1602   WBC 4.4 04/30/2019 1424   RBC 4.70 01/07/2021 1602   RBC 4.80 04/30/2019 1424   HGB 14.2 01/07/2021 1602   HCT 42.9 01/07/2021 1602   PLT 352 01/07/2021 1602   MCV 91 01/07/2021 1602   MCH 30.2 01/07/2021 1602   MCH 30.4 04/30/2019 1424   MCHC 33.1 01/07/2021 1602   MCHC 32.7 04/30/2019 1424   RDW 12.8 01/07/2021 1602   LYMPHSABS 2.5 11/16/2014 0735   MONOABS 0.5 11/16/2014 0735   EOSABS 0.4 11/16/2014 0735   BASOSABS 0.0 11/16/2014 0735    Lab Results  Component Value Date   HGBA1C 8.9 (A) 02/22/2022    Assessment & Plan:  1. Type 2 diabetes mellitus with hyperglycemia, with long-term current use of insulin (HCC) Uncontrolled with A1c of 8.9 Discussed possibility of discontinuing metformin and increasing dose of  glimepiride however he is also complaining of reflux and nausea with Ozempic Advised him that if we have to discontinue metformin and Ozempic the only other option will be insulin as he does have an allergy to SGLT2i He is opposed to insulin I am referring him to endocrine for optimization of management Counseled on Diabetic diet, my plate method, 093 minutes of moderate intensity exercise/week Blood sugar logs with fasting goals of 80-120 mg/dl, random of less than 180 and in the event of sugars less than 60 mg/dl or greater than 400 mg/dl encouraged to notify the clinic. Advised on the need for annual eye exams, annual foot exams, Pneumonia vaccine. - POCT glucose (manual entry) - POCT glycosylated hemoglobin (Hb A1C) - Ambulatory referral to Ophthalmology - Semaglutide, 2 MG/DOSE, (OZEMPIC, 2 MG/DOSE,) 8 MG/3ML SOPN; Inject 2 mg into the skin once a week.  Dispense: 9 mL; Refill: 1 - Ambulatory referral to Endocrinology - glimepiride (AMARYL) 2 MG tablet; Take 1 tablet (2 mg total) by mouth daily with breakfast.  Dispense: 30 tablet; Refill: 1 - metFORMIN (GLUCOPHAGE-XR) 500 MG 24 hr tablet; TAKE 2 TABLETS(1000 MG) BY MOUTH IN THE MORNING AND AT BEDTIME  Dispense: 120 tablet; Refill: 1  2. Hypertension associated with diabetes ([place]) Controlled Continue current antihypertensive regimen Counseled on blood pressure goal of less than 130/80, low-sodium, DASH diet, medication compliance, 150 minutes of moderate intensity exercise per week. Discussed medication compliance, adverse effects.  3.  Hyperlipidemia associated with type 2 diabetes mellitus Controlled Currently on Repatha and Zetia Low-cholesterol diet  4. Degeneration of lumbar intervertebral disc Uncontrolled Status post epidural spinal injections Followed by orthopedic He is being worked up for possible spine stimulator    Meds ordered this encounter  Medications   Semaglutide, 2 MG/DOSE, (OZEMPIC, 2 MG/DOSE,) 8 MG/3ML  SOPN    Sig: Inject 2 mg into the skin once a week.    Dispense:  9 mL    Refill:  1   glimepiride (AMARYL) 2 MG tablet    Sig: Take 1 tablet (2 mg total) by mouth daily with breakfast.    Dispense:  30 tablet    Refill:  1   metFORMIN (GLUCOPHAGE-XR) 500 MG 24 hr tablet    Sig: TAKE 2 TABLETS(1000 MG) BY MOUTH IN THE MORNING AND AT BEDTIME    Dispense:  120 tablet    Refill:  1    Follow-up: Return in  about 3 months (around 05/25/2022).       Charlott Rakes, MD, FAAFP. [facility] [place], [place]   02/22/2022, 1:29 PM

## 2022-02-22 NOTE — Patient Instructions (Signed)

## 2022-02-22 NOTE — Progress Notes (Signed)
Needs new endocrin referral. Discuss ozempic.

## 2022-02-24 ENCOUNTER — Other Ambulatory Visit: Payer: Self-pay | Admitting: Family Medicine

## 2022-02-24 DIAGNOSIS — E1165 Type 2 diabetes mellitus with hyperglycemia: Secondary | ICD-10-CM

## 2022-03-08 ENCOUNTER — Ambulatory Visit (INDEPENDENT_AMBULATORY_CARE_PROVIDER_SITE_OTHER): Payer: Medicare Other | Admitting: Licensed Clinical Social Worker

## 2022-03-08 ENCOUNTER — Encounter (HOSPITAL_COMMUNITY): Payer: Self-pay

## 2022-03-08 DIAGNOSIS — F411 Generalized anxiety disorder: Secondary | ICD-10-CM | POA: Insufficient documentation

## 2022-03-08 DIAGNOSIS — F331 Major depressive disorder, recurrent, moderate: Secondary | ICD-10-CM | POA: Diagnosis not present
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
## 2022-03-08 NOTE — Plan of Care (Signed)
Treatment plan updated

## 2022-03-10 ENCOUNTER — Other Ambulatory Visit: Payer: Self-pay | Admitting: Cardiovascular Disease

## 2022-03-10 DIAGNOSIS — I10 Essential (primary) hypertension: Secondary | ICD-10-CM

## 2022-03-27 ENCOUNTER — Ambulatory Visit (INDEPENDENT_AMBULATORY_CARE_PROVIDER_SITE_OTHER): Payer: Medicare Other | Admitting: Licensed Clinical Social Worker

## 2022-03-27 DIAGNOSIS — F331 Major depressive disorder, recurrent, moderate: Secondary | ICD-10-CM | POA: Diagnosis not present

## 2022-03-27 DIAGNOSIS — F411 Generalized anxiety disorder: Secondary | ICD-10-CM

## 2022-03-27 NOTE — Progress Notes (Signed)
   THERAPIST PROGRESS NOTE  Session Time: 76  Participation Level: Active  Behavioral Response: CasualAlertAnxious and Depressed  Type of Therapy: Individual Therapy  Treatment Goals addressed: Jose Bishop WILL PARTICIPATE IN AT LEAST 80% OF SCHEDULED INDIVIDUAL PSYCHOTHERAPY  SESSIONS  ProgressTowards Goals: Progressing  Interventions: CBT, Motivational Interviewing, and Supportive  Summary: Jose Bishop is a 56 y.o. male who presents with depressed and flat mood\affect.  Patient was pleasant, cooperative, maintained good eye contact.  He engaged well in therapy session was dressed casually.  Jose Bishop was alert and oriented x5.   Patient reports primary stressors as financials, work, and family conflict.  Patient reports that he has been on disability for multiple years since getting into an accident at work.  Patient reports that his income for disability does not cover his total expenses and he has been looking for alternative work to add on with his disability payments.  Patient reports that disability At $1400 per Month That He Can Make without Penalties to his income.  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  Other stressors for patient is family conflict with his sister who is struggling financially due to her own medical issues.  Patient reports that she is asked for financial help, but patient reports that he either has to lied to his wife about the help that he provides her or tell his sister who he is concerned about "no".    Suicidal/Homicidal: Nowithout intent/plan  Therapist Response:     Intervention/Plan: LCSW used therapy for supportive therapy, person centered therapy, and motivational interviewing.  LCSW used open-ended questions, reflective listening, and positive affirmations.  LCSW used supportive therapy for praise and encouragement.  LCSW used person centered therapy for  unconditional positive regard utilizing nonjudgmental stance and for empowerment.  LCSW educated patient on setting healthy boundaries such as normalizing the word "no".  LCSW provided resources to [place] works through [employer] works for further employment opportunities.  Plan for patient is to continue to look at jobs that he is able to accomplish with his disabilities looking for job opportunities that he can do versus what he can't do.  Plan: Return again in 3 weeks.  Diagnosis: Moderate episode of recurrent major depressive disorder (HCC)  GAD (generalized anxiety disorder)  Collaboration of Care: Other None today   Patient/Guardian was advised Release of Information must be obtained prior to any record release in order to collaborate their care with an outside provider. Patient/Guardian was advised if they have not already done so to contact the registration department to sign all necessary forms in order for [place] to release information regarding their care.   Consent: Patient/Guardian gives verbal consent for treatment and assignment of benefits for services provided during this visit. Patient/Guardian expressed understanding and agreed to proceed.   Jose Horn, LCSW 03/27/2022

## 2022-04-25 ENCOUNTER — Ambulatory Visit (HOSPITAL_COMMUNITY): Payer: Medicare Other | Admitting: Licensed Clinical Social Worker

## 2022-04-27 DIAGNOSIS — H25813 Combined forms of age-related cataract, bilateral: Secondary | ICD-10-CM | POA: Diagnosis not present

## 2022-04-27 DIAGNOSIS — H40053 Ocular hypertension, bilateral: Secondary | ICD-10-CM | POA: Diagnosis not present

## 2022-04-27 DIAGNOSIS — E119 Type 2 diabetes mellitus without complications: Secondary | ICD-10-CM | POA: Diagnosis not present

## 2022-04-28 DIAGNOSIS — K529 Noninfective gastroenteritis and colitis, unspecified: Secondary | ICD-10-CM | POA: Diagnosis not present

## 2022-04-28 DIAGNOSIS — B029 Zoster without complications: Secondary | ICD-10-CM | POA: Diagnosis not present

## 2022-05-01 DIAGNOSIS — L03113 Cellulitis of right upper limb: Secondary | ICD-10-CM | POA: Diagnosis not present

## 2022-05-02 ENCOUNTER — Telehealth: Payer: Self-pay | Admitting: Pharmacist Clinician (PhC)/ Clinical Pharmacy Specialist

## 2022-05-02 ENCOUNTER — Ambulatory Visit: Payer: Medicare Other | Admitting: Physician Assistant

## 2022-05-02 VITALS — BP 166/103 | Ht 74.5 in | Wt 262.0 lb

## 2022-05-02 DIAGNOSIS — I10 Essential (primary) hypertension: Secondary | ICD-10-CM

## 2022-05-02 DIAGNOSIS — L03113 Cellulitis of right upper limb: Secondary | ICD-10-CM | POA: Diagnosis not present

## 2022-05-02 NOTE — Telephone Encounter (Signed)
Repatha PA submitted and approved to 06/26/2023  Key [id number]

## 2022-05-02 NOTE — Progress Notes (Unsigned)
   Established Patient Office Visit  Subjective   Patient ID: Jose Bishop, male    DOB: 1965/12/26  Age: 56 y.o. MRN: [id number]  No chief complaint on file.   States that he went to [facility] on [facility] and was given script for valtrex for shingles on his   States that he had to stop the medication de to adverse reaction Swelling in arm  States that he went back to UC yesterday for cellulitis due to spider bite on his right forewarm     States that he was given a shot of rocephin and given a script fr [facility] , picked it up this morning but hadn't started it yet  States that he just wants to make sure that this is an appropriate treatment       {History (Optional):23778}  ROS    Objective:     There were no vitals taken for this visit. {Vitals History (Optional):23777}  Physical Exam   No results found for any visits on 05/02/22.  {Labs (Optional):23779}  The 10-year ASCVD risk score (Arnett DK, et al., 2019) is: 13.9%    Assessment & Plan:   Problem List Items Addressed This Visit   None   No follow-ups on file.    Loraine Grip Mayers, PA-C

## 2022-05-03 ENCOUNTER — Encounter: Payer: Self-pay | Admitting: Physician Assistant

## 2022-05-03 NOTE — Patient Instructions (Signed)
I encourage you to continue the Keflex as prescribed by urgent care.  Your blood pressure is elevated today, I encourage you to check your blood pressure at home, keep a written log and have available for all office visits.  Please feel free to return to the mobile unit or follow-up with your primary care provider if your blood pressure readings remain elevated.  Please let us know if there is anything else we can do for you  Kennieth Rad, PA-C Physician Assistant [place] [email or web address]   How to Take Your Blood Pressure Blood pressure is a measurement of how strongly your blood is pressing against the walls of your arteries. Arteries are blood vessels that carry blood from your heart throughout your body. Your health care provider takes your blood pressure at each office visit. You can also take your own blood pressure at home with a blood pressure monitor. You may need to take your own blood pressure to: Confirm a diagnosis of high blood pressure (hypertension). Monitor your blood pressure over time. Make sure your blood pressure medicine is working. Supplies needed: Blood pressure monitor. A chair to sit in. This should be a chair where you can sit upright with your back supported. Do not sit on a soft couch or an armchair. Table or desk. Small notebook and pencil or pen. How to prepare To get the most accurate reading, avoid the following for 30 minutes before you check your blood pressure: Drinking caffeine. Drinking alcohol. Eating. Smoking. Exercising. Five minutes before you check your blood pressure: Use the bathroom and urinate so that you have an empty bladder. Sit quietly in a chair. Do not talk. How to take your blood pressure To check your blood pressure, follow the instructions in the manual that came with your blood pressure monitor. If you have a digital blood pressure monitor, the instructions may be  as follows: Sit up straight in a chair. Place your feet on the floor. Do not cross your ankles or legs. Rest your left arm at the level of your heart on a table or desk or on the arm of a chair. Pull up your shirt sleeve. Wrap the blood pressure cuff around the upper part of your left arm, 1 inch (2.5 cm) above your elbow. It is best to wrap the cuff around bare skin. Fit the cuff snugly, but not too tightly, around your arm. You should be able to place only one finger between the cuff and your arm. Position the cord so that it rests in the bend of your elbow. Press the power button. Sit quietly while the cuff inflates and deflates. Read the digital reading on the monitor screen and write the numbers down (record them) in a notebook. Wait 2-3 minutes, then repeat the steps, starting at step 1. What does my blood pressure reading mean? A blood pressure reading consists of a higher number over a lower number. Ideally, your blood pressure should be below 120/80. The first ("top") number is called the systolic pressure. It is a measure of the pressure in your arteries as your heart beats. The second ("bottom") number is called the diastolic pressure. It is a measure of the pressure in your arteries as the heart relaxes. Blood pressure is classified into four stages. The following are the stages for adults who do not have a short-term serious illness or a chronic condition. Systolic pressure and diastolic pressure are measured in a unit called mm Hg (millimeters of mercury).  Normal Systolic pressure: below 875. Diastolic pressure: below 80. Elevated Systolic pressure: 643-329. Diastolic pressure: below 80. Hypertension stage 1 Systolic pressure: 518-841. Diastolic pressure: 66-06. Hypertension stage 2 Systolic pressure: 301 or above. Diastolic pressure: 90 or above. You can have elevated blood pressure or hypertension even if only the systolic or only the diastolic number in your reading is  higher than normal. Follow these instructions at home: Medicines Take over-the-counter and prescription medicines only as told by your health care provider. Tell your health care provider if you are having any side effects from blood pressure medicine. General instructions Check your blood pressure as often as recommended by your health care provider. Check your blood pressure at the same time every day. Take your monitor to the next appointment with your health care provider to make sure that: You are using it correctly. It provides accurate readings. Understand what your goal blood pressure numbers are. Keep all follow-up visits. This is important. General tips Your health care provider can suggest a reliable monitor that will meet your needs. There are several types of home blood pressure monitors. Choose a monitor that has an arm cuff. Do not choose a monitor that measures your blood pressure from your wrist or finger. Choose a cuff that wraps snugly, not too tight or too loose, around your upper arm. You should be able to fit only one finger between your arm and the cuff. You can buy a blood pressure monitor at most drugstores or online. Where to find more information American Heart Association: www.heart.org Contact a health care provider if: Your blood pressure is consistently high. Your blood pressure is suddenly low. Get help right away if: Your systolic blood pressure is higher than 180. Your diastolic blood pressure is higher than 120. These symptoms may be an emergency. Get help right away. Call 911. Do not wait to see if the symptoms will go away. Do not drive yourself to the hospital. Summary Blood pressure is a measurement of how strongly your blood is pressing against the walls of your arteries. A blood pressure reading consists of a higher number over a lower number. Ideally, your blood pressure should be below 120/80. Check your blood pressure at the same time every  day. Avoid caffeine, alcohol, smoking, and exercise for 30 minutes prior to checking your blood pressure. These agents can affect the accuracy of the blood pressure reading. This information is not intended to replace advice given to you by your health care provider. Make sure you discuss any questions you have with your health care provider. Document Revised: 02/24/2021 Document Reviewed: 02/24/2021 Elsevier Patient Education  [place].

## 2022-05-16 ENCOUNTER — Encounter: Payer: Self-pay | Admitting: Physician Assistant

## 2022-05-16 ENCOUNTER — Ambulatory Visit: Payer: Medicare Other | Admitting: Physician Assistant

## 2022-05-16 VITALS — BP 151/90 | HR 58

## 2022-05-16 DIAGNOSIS — Z794 Long term (current) use of insulin: Secondary | ICD-10-CM

## 2022-05-16 DIAGNOSIS — L03113 Cellulitis of right upper limb: Secondary | ICD-10-CM | POA: Diagnosis not present

## 2022-05-16 DIAGNOSIS — M5432 Sciatica, left side: Secondary | ICD-10-CM | POA: Diagnosis not present

## 2022-05-16 DIAGNOSIS — L089 Local infection of the skin and subcutaneous tissue, unspecified: Secondary | ICD-10-CM

## 2022-05-16 DIAGNOSIS — E1165 Type 2 diabetes mellitus with hyperglycemia: Secondary | ICD-10-CM

## 2022-05-16 MED ORDER — SULFAMETHOXAZOLE-TRIMETHOPRIM 800-160 MG PO TABS: 1.0000 | ORAL_TABLET | Freq: Two times a day (BID) | ORAL | 0 refills | Status: AC

## 2022-05-16 MED ORDER — OZEMPIC (2 MG/DOSE) 8 MG/3ML ~~LOC~~ SOPN: 2.0000 mg | PEN_INJECTOR | SUBCUTANEOUS | 1 refills | Status: DC

## 2022-05-16 MED ORDER — MUPIROCIN 2 % EX OINT: 1.0000 | TOPICAL_OINTMENT | Freq: Two times a day (BID) | CUTANEOUS | 0 refills | Status: AC

## 2022-05-16 MED ORDER — GABAPENTIN 300 MG PO CAPS: ORAL_CAPSULE | ORAL | 0 refills | Status: DC

## 2022-05-16 NOTE — Patient Instructions (Signed)
To help with your skin infection, you are going to take Bactrim twice a day for 5 days.  You will also use Bactroban nasal ointment in each nostril twice a day for 5 days.  Then use it once a week for 4 weeks.  I sent a refill of your Ozempic and gabapentin to your pharmacy.  Please let us know if there is anything else we can do for you.  Kennieth Rad, PA-C Physician Assistant [facility] [email or web address]   Cellulitis, Adult  Cellulitis is a skin infection. The infected area is usually warm, red, swollen, and tender. This condition occurs most often in the arms and lower legs. The infection can travel to the muscles, blood, and underlying tissue and become serious. It is very important to get treated for this condition. What are the causes? Cellulitis is caused by bacteria. The bacteria enter through a break in the skin, such as a cut, burn, insect bite, open sore, or crack. What increases the risk? This condition is more likely to occur in people who: Have a weak body defense system (immune system). Have open wounds on the skin, such as cuts, burns, bites, and scrapes. Bacteria can enter the body through these open wounds. Are older than 55 years of age. Have diabetes. Have a type of long-lasting (chronic) liver disease (cirrhosis) or kidney disease. Are obese. Have a skin condition such as: Itchy rash (eczema). Slow movement of blood in the veins (venous stasis). Fluid buildup below the skin (edema). Have had radiation therapy. Use IV drugs. What are the signs or symptoms? Symptoms of this condition include: Redness, streaking, or spotting on the skin. Swollen area of the skin. Tenderness or pain when an area of the skin is touched. Warm skin. A fever. Chills. Blisters. How is this diagnosed? This condition is diagnosed based on a medical history and physical exam. You may also have tests, including: Blood  tests. Imaging tests. How is this treated? Treatment for this condition may include: Medicines, such as antibiotic medicines or medicines to treat allergies (antihistamines). Supportive care, such as rest and application of cold or warm cloths (compresses) to the skin. Hospital care, if the condition is severe. The infection usually starts to get better within 1-2 days of treatment. Follow these instructions at home:  Medicines Take over-the-counter and prescription medicines only as told by your health care provider. If you were prescribed an antibiotic medicine, take it as told by your health care provider. Do not stop taking the antibiotic even if you start to feel better. General instructions Drink enough fluid to keep your urine pale yellow. Do not touch or rub the infected area. Raise (elevate) the infected area above the level of your heart while you are sitting or lying down. Apply warm or cold compresses to the affected area as told by your health care provider. Keep all follow-up visits as told by your health care provider. This is important. These visits let your health care provider make sure a more serious infection is not developing. Contact a health care provider if: You have a fever. Your symptoms do not begin to improve within 1-2 days of starting treatment. Your bone or joint underneath the infected area becomes painful after the skin has healed. Your infection returns in the same area or another area. You notice a swollen bump in the infected area. You develop new symptoms. You have a general ill feeling (malaise) with muscle aches and pains. Get help right away if:  Your symptoms get worse. You feel very sleepy. You develop vomiting or diarrhea that persists. You notice red streaks coming from the infected area. Your red area gets larger or turns dark in color. These symptoms may represent a serious problem that is an emergency. Do not wait to see if the symptoms  will go away. Get medical help right away. Call your local emergency services (911 in the [place] Do not drive yourself to the hospital. Summary Cellulitis is a skin infection. This condition occurs most often in the arms and lower legs. Treatment for this condition may include medicines, such as antibiotic medicines or antihistamines. Take over-the-counter and prescription medicines only as told by your health care provider. If you were prescribed an antibiotic medicine, do not stop taking the antibiotic even if you start to feel better. Contact a health care provider if your symptoms do not begin to improve within 1-2 days of starting treatment or your symptoms get worse. Keep all follow-up visits as told by your health care provider. This is important. These visits let your health care provider make sure that a more serious infection is not developing. This information is not intended to replace advice given to you by your health care provider. Make sure you discuss any questions you have with your health care provider. Document Revised: 03/23/2021 Document Reviewed: 03/24/2021 Elsevier Patient Education  [place].

## 2022-05-16 NOTE — Progress Notes (Unsigned)
   Established Patient Office Visit  Subjective   Patient ID: Jose Bishop, male    DOB: May 31, 1966  Age: 56 y.o. MRN: [id number]  No chief complaint on file.   States that he finished the keflex as prescribed by the UC for cellulitis  States that he has had several bumps come up, one on his left hand and left upper arm, states that the one on his left finger did produce grey discharge  Did not take today - 115/77 at home       {History (Optional):23778}  ROS    Objective:     There were no vitals taken for this visit. {Vitals History (Optional):23777}  Physical Exam   No results found for any visits on 05/16/22.  {Labs (Optional):23779}  The 10-year ASCVD risk score (Arnett DK, et al., 2019) is: 27.2%    Assessment & Plan:   Problem List Items Addressed This Visit   None   No follow-ups on file.    Loraine Grip Mayers, PA-C

## 2022-05-17 ENCOUNTER — Other Ambulatory Visit: Payer: Self-pay | Admitting: Nurse Practitioner

## 2022-05-17 DIAGNOSIS — M5432 Sciatica, left side: Secondary | ICD-10-CM | POA: Insufficient documentation

## 2022-05-17 DIAGNOSIS — L089 Local infection of the skin and subcutaneous tissue, unspecified: Secondary | ICD-10-CM | POA: Insufficient documentation

## 2022-05-17 DIAGNOSIS — I10 Essential (primary) hypertension: Secondary | ICD-10-CM

## 2022-05-30 ENCOUNTER — Encounter: Payer: Self-pay | Admitting: Pharmacist

## 2022-05-30 DIAGNOSIS — G720 Drug-induced myopathy: Secondary | ICD-10-CM

## 2022-05-30 NOTE — Progress Notes (Signed)
[facility])                                            [place] Team                                        Statin Quality Measure Assessment    05/30/2022  EZRA DENNE 12/23/1965 [id number]  .     Per review of chart and payor information, patient has a diagnosis of diabetes but is not currently filling a statin prescription.  This places patient into the SUPD (Statin Use In Patients with Diabetes) measure for CMS.    Patient has documented statin intolerance but does not have a statin exclusion code associated with a visit.  She has an upcoming appointment on 05/31/2022.  If deemed therapeutically appropriate, a statin exclusion code could be associated with an office visit. Associating a statin exclusion code with a provider visit will remove the patient from the measure.  The 10-year ASCVD risk score (Arnett DK, et al., 2019) is: 23.3%   Values used to calculate the score:     Age: 56 years     Sex: Male     Is Non-Hispanic African American: Yes     Diabetic: Yes     Tobacco smoker: No     Systolic Blood Pressure: 629 mmHg     Is BP treated: Yes     HDL Cholesterol: 59 mg/dL     Total Cholesterol: 150 mg/dL 12/21/2021     Component Value Date/Time   CHOL 150 12/21/2021 0906   TRIG 113 12/21/2021 0906   HDL 59 12/21/2021 0906   CHOLHDL 2.5 12/21/2021 0906   LDLCALC 71 12/21/2021 0906   LDLDIRECT 145 (H) 01/06/2020 1046    Please consider ONE of the following recommendations:  Initiate high intensity statin Atorvastatin '40mg'$  once daily, #90, 3 refills   Rosuvastatin '20mg'$  once daily, #90, 3 refills    Initiate moderate intensity          statin with reduced frequency if prior          statin intolerance 1x weekly, #13, 3 refills   2x weekly, #26, 3 refills   3x weekly, #39, 3 refills    Code for past statin intolerance or  other exclusions (required annually)   Provider Requirements:  Associate  code during an office visit or telehealth encounter  Drug Induced Myopathy G72.0   Myopathy, unspecified G72.9   Myositis, unspecified M60.9   Rhabdomyolysis B28.41   Alcoholic fatty liver L24.4   Cirrhosis of liver K74.69   Prediabetes R73.03   PCOS E28.2   Toxic liver disease, unspecified K71.9         Plan:  Route note to provider prior to upcoming appointment.  Elayne Guerin, PharmD, [place] Clinical Pharmacist [phone]

## 2022-05-31 ENCOUNTER — Ambulatory Visit: Payer: Medicare Other | Admitting: Family Medicine

## 2022-06-01 ENCOUNTER — Ambulatory Visit: Payer: Medicare Other | Attending: Physician Assistant | Admitting: Physician Assistant

## 2022-06-01 ENCOUNTER — Encounter: Payer: Self-pay | Admitting: Physician Assistant

## 2022-06-01 VITALS — BP 101/68 | HR 85 | Ht 75.0 in | Wt 256.0 lb

## 2022-06-01 DIAGNOSIS — I10 Essential (primary) hypertension: Secondary | ICD-10-CM

## 2022-06-01 DIAGNOSIS — E1165 Type 2 diabetes mellitus with hyperglycemia: Secondary | ICD-10-CM

## 2022-06-01 DIAGNOSIS — E871 Hypo-osmolality and hyponatremia: Secondary | ICD-10-CM | POA: Diagnosis not present

## 2022-06-01 DIAGNOSIS — Z794 Long term (current) use of insulin: Secondary | ICD-10-CM

## 2022-06-01 DIAGNOSIS — M5432 Sciatica, left side: Secondary | ICD-10-CM | POA: Diagnosis not present

## 2022-06-01 DIAGNOSIS — E782 Mixed hyperlipidemia: Secondary | ICD-10-CM | POA: Diagnosis not present

## 2022-06-01 LAB — POCT GLYCOSYLATED HEMOGLOBIN (HGB A1C): Hemoglobin A1C: 10.4 % — AB (ref 4.0–5.6)

## 2022-06-01 LAB — GLUCOSE, POCT (MANUAL RESULT ENTRY): POC Glucose: 206 mg/dl — AB (ref 70–99)

## 2022-06-01 MED ORDER — LISINOPRIL 30 MG PO TABS: ORAL_TABLET | ORAL | 1 refills | Status: DC

## 2022-06-01 MED ORDER — GABAPENTIN 300 MG PO CAPS: ORAL_CAPSULE | ORAL | 3 refills | Status: AC

## 2022-06-01 MED ORDER — METFORMIN HCL ER 500 MG PO TB24: ORAL_TABLET | ORAL | 1 refills | Status: DC

## 2022-06-01 MED ORDER — CHLORTHALIDONE 25 MG PO TABS: ORAL_TABLET | ORAL | 1 refills | Status: DC

## 2022-06-01 MED ORDER — EZETIMIBE 10 MG PO TABS: ORAL_TABLET | ORAL | 1 refills | Status: DC

## 2022-06-01 MED ORDER — GLIMEPIRIDE 2 MG PO TABS: ORAL_TABLET | ORAL | 1 refills | Status: DC

## 2022-06-01 NOTE — Patient Instructions (Signed)
Check blood sugars fasting and at bedtime and record and take these with you to your endocrinology appt in January.

## 2022-06-01 NOTE — Progress Notes (Signed)
Patient ID: Jose Bishop, male   DOB: Dec 19, 1965, 56 y.o.   MRN: [id number]   Jose Bishop, is a 56 y.o. male  [id number]:[id number]  [id number]  DOB - 05/24/1966  Chief Complaint  Patient presents with   Follow-up    A1C, glucose check        Subjective:   Jose Bishop is a 56 y.o. male here today for diabetes check and med RF.  Labs done 1 month ago and reviewed today.  He has been out of ozempic for 2 months and can no longer afford it.  He says blood sugars running from 170-low 300s.    Declines flu vaccine.    Denies HA/dizziness/CP/SOB.  No polyuria/polydipsia    No problems updated.  ALLERGIES: Allergies  Allergen Reactions   Bee Venom Anaphylaxis   Dapagliflozin Pro-Metformin Er    Lipitor [Atorvastatin]     FATIGUED NAUSEA BRAIN FOG    PAST MEDICAL HISTORY: Past Medical History:  Diagnosis Date   AAA (abdominal aortic aneurysm) (HCC)    Anxiety    Bradycardia    Bradycardia    Difficulty sleeping    Essential hypertension    Family history of stroke    Headache    History of transient ischemic attack (TIA)    15 YRS AGO   Hyperlipidemia    Impotence of organic origin    Liver cyst    Memory loss    Osteoarthritis of acromioclavicular joint    Prostate cancer (HCC)    PVC (premature ventricular contraction)    Type 2 diabetes mellitus with hyperglycemia ([place])     MEDICATIONS AT HOME: Prior to Admission medications   Medication Sig Start Date End Date Taking? Authorizing Provider  APPLE CIDER VINEGAR PO Take by mouth.   Yes [provider]  baclofen (LIORESAL) 10 MG tablet Take 1 tablet (10 mg total) by mouth 2 (two) times daily between meals as needed for muscle spasms. 08/22/21  Yes Charlott Rakes, MD  cetirizine (ZYRTEC) 10 MG tablet Take 1 tablet (10 mg total) by mouth daily. 07/12/21  Yes Mayers, Cari S, PA-C  Evolocumab with Infusor ([place]) 420 MG/3.5ML SOCT Inject 420 mg into the skin every 30 (thirty) days. 10/10/21   Yes Monge, Helane Gunther, NP  fluticasone (FLONASE) 50 MCG/ACT nasal spray Place 2 sprays into both nostrils daily. 04/19/21  Yes Fenton Foy, NP  glucose blood (ONETOUCH VERIO) test strip 1 each by Other route daily. And lancets 1/day 07/19/21  Yes Renato Shin, MD  latanoprost (XALATAN) 0.005 % ophthalmic solution Place 1 drop into both eyes at bedtime.   Yes [provider]  omeprazole (PRILOSEC) 40 MG capsule Take 1 capsule (40 mg total) by mouth in the morning. 08/11/21  Yes Willia Craze, NP  chlorthalidone (HYGROTON) 25 MG tablet TAKE 1 TABLET(25 MG) BY MOUTH DAILY 06/01/22   Freeman Caldron M, PA-C  ezetimibe (ZETIA) 10 MG tablet TAKE 1 TABLET(10 MG) BY MOUTH DAILY 06/01/22   Argentina Donovan, PA-C  gabapentin (NEURONTIN) 300 MG capsule TAKE 1 CAPSULE(300 MG) BY MOUTH AT BEDTIME 06/01/22   Makhi Muzquiz M, PA-C  glimepiride (AMARYL) 2 MG tablet 2 tabs with breakfast and 1 tab with dinner 06/01/22   Argentina Donovan, PA-C  lisinopril (ZESTRIL) 30 MG tablet TAKE 1 TABLET(30 MG) BY MOUTH DAILY 06/01/22   Freeman Caldron M, PA-C  metFORMIN (GLUCOPHAGE-XR) 500 MG 24 hr tablet TAKE 2 TABLETS(1000 MG) BY MOUTH IN THE MORNING AND  AT BEDTIME 06/01/22   Allyah Heather, Dionne Bucy, PA-C    ROS: Neg HEENT Neg resp Neg cardiac Neg GI Neg GU Neg MS Neg psych Neg neuro  Objective:   Vitals:   06/01/22 1121  BP: 101/68  Pulse: 85  SpO2: 98%  Weight: 256 lb (116.1 kg)  Height: '6\' 3"'$  (1.905 m)   Exam General appearance : Awake, alert, not in any distress. Speech Clear. Not toxic looking HEENT: Atraumatic and Normocephalic Neck: Supple, no JVD. No cervical lymphadenopathy.  Chest: Good air entry bilaterally, CTAB.  No rales/rhonchi/wheezing CVS: S1 S2 regular, no murmurs.  Extremities: B/L Lower Ext shows no edema, both legs are warm to touch Neurology: Awake alert, and oriented X 3, CN II-XII intact, Non focal Skin: No Rash  Data Review Lab Results  Component Value Date   HGBA1C  8.9 (A) 02/22/2022   HGBA1C 9.6 (A) 07/19/2021   HGBA1C 9.2 (A) 04/18/2021    Assessment & Plan   1. Type 2 diabetes mellitus with hyperglycemia, with long-term current use of insulin (HCC) A1C=10.4 today.  He is unable to get ozempic due to cost currently and has not taken it in 2 months.  I will increase his glimepiride from '2mg'$  to '6mg'$  daily and have him check his blood sugars fasting and bedtime and take to his endocrine appt in 1 months - Glucose (CBG) - HgB L9F - Basic metabolic panel - metFORMIN (GLUCOPHAGE-XR) 500 MG 24 hr tablet; TAKE 2 TABLETS(1000 MG) BY MOUTH IN THE MORNING AND AT BEDTIME  Dispense: 360 tablet; Refill: 1 - glimepiride (AMARYL) 2 MG tablet; 2 tabs with breakfast and 1 tab with dinner  Dispense: 270 tablet; Refill: 1  2. Hyponatremia Likely due to poor glycemic control - Basic metabolic panel  3. Mixed hyperlipidemia - ezetimibe (ZETIA) 10 MG tablet; TAKE 1 TABLET(10 MG) BY MOUTH DAILY  Dispense: 90 tablet; Refill: 1  4. Essential hypertension Controlled/much improved over last visit - lisinopril (ZESTRIL) 30 MG tablet; TAKE 1 TABLET(30 MG) BY MOUTH DAILY  Dispense: 90 tablet; Refill: 1 - chlorthalidone (HYGROTON) 25 MG tablet; TAKE 1 TABLET(25 MG) BY MOUTH DAILY  Dispense: 90 tablet; Refill: 1  6. Sciatic nerve pain, left - gabapentin (NEURONTIN) 300 MG capsule; TAKE 1 CAPSULE(300 MG) BY MOUTH AT BEDTIME  Dispense: 30 capsule; Refill: 3    Return for keep next appts.  The patient was given clear instructions to go to ER or return to medical center if symptoms don't improve, worsen or new problems develop. The patient verbalized understanding. The patient was told to call to get lab results if they haven't heard anything in the next week.      Freeman Caldron, PA-C [facility], [place]   06/01/2022, 11:48 AM

## 2022-06-02 LAB — BASIC METABOLIC PANEL
BUN/Creatinine Ratio: 20 (ref 9–20)
BUN: 26 mg/dL — ABNORMAL HIGH (ref 6–24)
CO2: 19 mmol/L — ABNORMAL LOW (ref 20–29)
Calcium: 10 mg/dL (ref 8.7–10.2)
Chloride: 94 mmol/L — ABNORMAL LOW (ref 96–106)
Creatinine, Ser: 1.33 mg/dL — ABNORMAL HIGH (ref 0.76–1.27)
Glucose: 215 mg/dL — ABNORMAL HIGH (ref 70–99)
Potassium: 4.4 mmol/L (ref 3.5–5.2)
Sodium: 132 mmol/L — ABNORMAL LOW (ref 134–144)
eGFR: 63 mL/min/{1.73_m2} (ref >59)

## 2022-06-05 ENCOUNTER — Telehealth: Payer: Self-pay

## 2022-06-05 NOTE — Telephone Encounter (Signed)
Jose Bishop called with questions as how to Increase his sodium levels. Patient was informed per PA, Jose Bishop,  his low sodium level is due to his uncontrolled diabetes.  It was suggested for him to take all of his medications, check his blood sugars and see the endocrinologist as scheduled in the next few weeks. His labs will be repeated at that visit. All questions answered with no further concerns at this time.

## 2022-06-12 ENCOUNTER — Other Ambulatory Visit: Payer: Self-pay

## 2022-06-12 ENCOUNTER — Ambulatory Visit: Payer: Medicare Other | Attending: Family Medicine | Admitting: Family Medicine

## 2022-06-12 ENCOUNTER — Encounter: Payer: Self-pay | Admitting: Family Medicine

## 2022-06-12 VITALS — BP 122/76 | HR 67 | Temp 98.0°F | Ht 75.0 in | Wt 257.8 lb

## 2022-06-12 DIAGNOSIS — E1169 Type 2 diabetes mellitus with other specified complication: Secondary | ICD-10-CM | POA: Diagnosis not present

## 2022-06-12 DIAGNOSIS — Z794 Long term (current) use of insulin: Secondary | ICD-10-CM | POA: Diagnosis not present

## 2022-06-12 DIAGNOSIS — E785 Hyperlipidemia, unspecified: Secondary | ICD-10-CM | POA: Diagnosis not present

## 2022-06-12 DIAGNOSIS — T466X5A Adverse effect of antihyperlipidemic and antiarteriosclerotic drugs, initial encounter: Secondary | ICD-10-CM

## 2022-06-12 DIAGNOSIS — I152 Hypertension secondary to endocrine disorders: Secondary | ICD-10-CM | POA: Diagnosis not present

## 2022-06-12 DIAGNOSIS — G720 Drug-induced myopathy: Secondary | ICD-10-CM

## 2022-06-12 DIAGNOSIS — E1159 Type 2 diabetes mellitus with other circulatory complications: Secondary | ICD-10-CM | POA: Diagnosis not present

## 2022-06-12 DIAGNOSIS — L03114 Cellulitis of left upper limb: Secondary | ICD-10-CM | POA: Diagnosis not present

## 2022-06-12 DIAGNOSIS — E1165 Type 2 diabetes mellitus with hyperglycemia: Secondary | ICD-10-CM

## 2022-06-12 MED ORDER — DOXYCYCLINE HYCLATE 100 MG PO TABS: 100.0000 mg | ORAL_TABLET | Freq: Two times a day (BID) | ORAL | 0 refills | Status: DC

## 2022-06-12 NOTE — Progress Notes (Signed)
Subjective:  Patient ID: Jose Bishop, male    DOB: 1966-04-22  Age: 56 y.o. MRN: [id number]  CC: Rash   HPI Jose Bishop is a 56 y.o. year old male with a history of type 2 diabetes (A1c 10.4 managed by endocrine), hypertension, depression, DDD of lumbar spine, dilatation of ascending aorta.   Interval History:  1 month ago he was treated for cellulitis of right upper extremity and treated with Bactrim after he had completed a course of Keflex.  Prior to that he was seen at [facility] and treated for cellulitis of the right upper extremity as well. Now he has reddish bumps on his left upper extremity x1 week and he used Bactroban ointment with cleared one rash and then other ones developed on his inner left upper arm.  He denies presence of fever, myalgias, chills. Rash did not appear on his trunk but only on his upper extremities.    His diabetes mellitus is uncontrolled he has been strongly opposed to initiation of insulin and remains on metformin and glimepiride ; unable to obtain Ozempic as he is in the donut hole and is allergic to SGLT2.  He also has statin myopathy and is on Repatha and Zetia. I had referred him to endocrine in 01/2022 but referral declined He has an upcoming appointment with Endocrine in 06/2022. 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  Taking metformin had caused him to move his bowels a lot and he goes up to 4 times a day but denies presence of diarrhea and describes this as soft bowel movements.  He has cramps which are intermittent and review of his chart indicates he is not on a statin.  Past Medical History:  Diagnosis Date   AAA (abdominal aortic aneurysm) (HCC)    Anxiety    Bradycardia    Bradycardia    Difficulty sleeping    Essential hypertension    Family history of stroke    Headache    History of  transient ischemic attack (TIA)    15 YRS AGO   Hyperlipidemia    Impotence of organic origin    Liver cyst    Memory loss    Osteoarthritis of acromioclavicular joint    Prostate cancer (HCC)    PVC (premature ventricular contraction)    Type 2 diabetes mellitus with hyperglycemia ([place])     Past Surgical History:  Procedure Laterality Date   HERNIA REPAIR     X2 ( 1 ING HERNIA / 1 UMBILICAL HERNIA )   LYMPHADENECTOMY Bilateral 01/04/2015   Procedure: BILATERAL LYMPHADENECTOMY;  Surgeon: Raynelle Bring, MD;  Location: [facility];  Service: Urology;  Laterality: Bilateral;   MASS EXCISION     L UPPER ARM   PENILE PROSTHESIS IMPLANT  03/18/2019   ROBOT ASSISTED LAPAROSCOPIC RADICAL PROSTATECTOMY N/A 01/04/2015   Procedure: ROBOTIC ASSISTED LAPAROSCOPIC RADICAL PROSTATECTOMY LEVEL 2;  Surgeon: Raynelle Bring, MD;  Location: [facility];  Service: Urology;  Laterality: N/A;   ROTATOR CUFF REPAIR Bilateral    SURGERY SCROTAL / TESTICULAR      Family History  Problem Relation Age of Onset   Anuerysm Mother    Stroke Mother    Hypertension Father    Peripheral vascular disease Father    Stroke Father    Colon cancer Father  36's   Diabetes Sister    Heart murmur Sister    Heart murmur Son    Stomach cancer Neg Hx    Rectal cancer Neg Hx    Throat cancer Neg Hx     Social History   Socioeconomic History   Marital status: Married    Spouse name: Not on file   Number of children: 5   Years of education: Not on file   Highest education level: Associate degree: occupational, Hotel manager, or vocational program  Occupational History   Occupation: part time  Tobacco Use   Smoking status: Never   Smokeless tobacco: Never  Vaping Use   Vaping Use: Never used  Substance and Sexual Activity   Alcohol use: Yes    Alcohol/week: 0.0 standard drinks of alcohol    Comment: OCCASIONAL   Drug use: No   Sexual activity: Yes    Partners: Female  Other Topics Concern   Not on file   Social History Narrative   Not on file   Social Determinants of Health   Financial Resource Strain: Low Risk  (12/01/2021)   Overall Financial Resource Strain (CARDIA)    Difficulty of Paying Living Expenses: Not very hard  Food Insecurity: No Food Insecurity (12/01/2021)   Hunger Vital Sign    Worried About Running Out of Food in the Last Year: Never true    Ran Out of Food in the Last Year: Never true  Transportation Needs: No Transportation Needs (12/01/2021)   PRAPARE - Hydrologist (Medical): No    Lack of Transportation (Non-Medical): No  Physical Activity: Insufficiently Active (12/01/2021)   Exercise Vital Sign    Days of Exercise per Week: 3 days    Minutes of Exercise per Session: 20 min  Stress: Stress Concern Present (12/01/2021)   [place]    Feeling of Stress : To some extent  Social Connections: Moderately Integrated (12/01/2021)   Social Connection and Isolation Panel [NHANES]    Frequency of Communication with Friends and Family: Once a week    Frequency of Social Gatherings with Friends and Family: Once a week    Attends Religious Services: More than 4 times per year    Active Member of [employer] or Organizations: Yes    Attends Music therapist: More than 4 times per year    Marital Status: Married    Allergies  Allergen Reactions   Bee Venom Anaphylaxis   Dapagliflozin Pro-Metformin Er    Lipitor [Atorvastatin]     FATIGUED NAUSEA BRAIN FOG    Outpatient Medications Prior to Visit  Medication Sig Dispense Refill   APPLE CIDER VINEGAR PO Take by mouth.     baclofen (LIORESAL) 10 MG tablet Take 1 tablet (10 mg total) by mouth 2 (two) times daily between meals as needed for muscle spasms. 60 each 0   cetirizine (ZYRTEC) 10 MG tablet Take 1 tablet (10 mg total) by mouth daily. 30 tablet 11   chlorthalidone (HYGROTON) 25 MG tablet TAKE 1 TABLET(25 MG) BY MOUTH  DAILY 90 tablet 1   Evolocumab with Infusor ([place]) 420 MG/3.5ML SOCT Inject 420 mg into the skin every 30 (thirty) days. 3.6 mL 11   ezetimibe (ZETIA) 10 MG tablet TAKE 1 TABLET(10 MG) BY MOUTH DAILY 90 tablet 1   fluticasone (FLONASE) 50 MCG/ACT nasal spray Place 2 sprays into both nostrils daily. 16 g 6   gabapentin (NEURONTIN)  300 MG capsule TAKE 1 CAPSULE(300 MG) BY MOUTH AT BEDTIME 30 capsule 3   glimepiride (AMARYL) 2 MG tablet 2 tabs with breakfast and 1 tab with dinner 270 tablet 1   glucose blood (ONETOUCH VERIO) test strip 1 each by Other route daily. And lancets 1/day 100 each 3   latanoprost (XALATAN) 0.005 % ophthalmic solution Place 1 drop into both eyes at bedtime.     lisinopril (ZESTRIL) 30 MG tablet TAKE 1 TABLET(30 MG) BY MOUTH DAILY 90 tablet 1   metFORMIN (GLUCOPHAGE-XR) 500 MG 24 hr tablet TAKE 2 TABLETS(1000 MG) BY MOUTH IN THE MORNING AND AT BEDTIME 360 tablet 1   omeprazole (PRILOSEC) 40 MG capsule Take 1 capsule (40 mg total) by mouth in the morning. 30 capsule 11   No facility-administered medications prior to visit.     ROS Review of Systems  Constitutional:  Negative for activity change and appetite change.  HENT:  Negative for sinus pressure and sore throat.   Respiratory:  Negative for chest tightness, shortness of breath and wheezing.   Cardiovascular:  Negative for chest pain and palpitations.  Gastrointestinal:  Negative for abdominal distention, abdominal pain and constipation.  Genitourinary: Negative.   Musculoskeletal:        See HPI  Skin:  Positive for rash.  Psychiatric/Behavioral:  Negative for behavioral problems and dysphoric mood.     Objective:  BP 122/76   Pulse 67   Temp 98 F (36.7 C) (Oral)   Ht '6\' 3"'$  (1.905 m)   Wt 257 lb 12.8 oz (116.9 kg)   SpO2 99%   BMI 32.22 kg/m      06/12/2022    2:49 PM 06/01/2022   11:21 AM 05/16/2022    4:17 PM  BP/Weight  Systolic BP 462 703 500  Diastolic BP 76 68 90   Wt. (Lbs) 257.8 256   BMI 32.22 kg/m2 32 kg/m2       Physical Exam Constitutional:      Appearance: He is well-developed.  Cardiovascular:     Rate and Rhythm: Normal rate.     Heart sounds: Normal heart sounds. No murmur heard. Pulmonary:     Effort: Pulmonary effort is normal.     Breath sounds: Normal breath sounds. No wheezing or rales.  Chest:     Chest wall: No tenderness.  Abdominal:     General: Bowel sounds are normal. There is no distension.     Palpations: Abdomen is soft. There is no mass.     Tenderness: There is no abdominal tenderness.  Musculoskeletal:        General: Normal range of motion.     Right lower leg: No edema.     Left lower leg: No edema.  Skin:    Comments: Erythematous hives on medial aspect of the left upper arm  Neurological:     Mental Status: He is alert and oriented to person, place, and time.  Psychiatric:        Mood and Affect: Mood normal.        Latest Ref Rng & Units 06/01/2022   11:57 AM 12/21/2021    9:07 AM 08/22/2021   11:53 AM  CMP  Glucose 70 - 99 mg/dL 215   132   BUN 6 - 24 mg/dL 26   16   Creatinine 0.76 - 1.27 mg/dL 1.33   1.07   Sodium 134 - 144 mmol/L 132   135   Potassium 3.5 - 5.2 mmol/L 4.4  4.4   Chloride 96 - 106 mmol/L 94   96   CO2 20 - 29 mmol/L 19   25   Calcium 8.7 - 10.2 mg/dL 10.0   9.6   Total Protein 6.0 - 8.5 g/dL  7.5  7.5   Total Bilirubin 0.0 - 1.2 mg/dL  0.7  0.5   Alkaline Phos 44 - 121 IU/L  42  49   AST 0 - 40 IU/L  19  16   ALT 0 - 44 IU/L  21  18     Lipid Panel     Component Value Date/Time   CHOL 150 12/21/2021 0906   TRIG 113 12/21/2021 0906   HDL 59 12/21/2021 0906   CHOLHDL 2.5 12/21/2021 0906   LDLCALC 71 12/21/2021 0906   LDLDIRECT 145 (H) 01/06/2020 1046    CBC    Component Value Date/Time   WBC 5.0 01/07/2021 1602   WBC 4.4 04/30/2019 1424   RBC 4.70 01/07/2021 1602   RBC 4.80 04/30/2019 1424   HGB 14.2 01/07/2021 1602   HCT 42.9 01/07/2021 1602   PLT 352  01/07/2021 1602   MCV 91 01/07/2021 1602   MCH 30.2 01/07/2021 1602   MCH 30.4 04/30/2019 1424   MCHC 33.1 01/07/2021 1602   MCHC 32.7 04/30/2019 1424   RDW 12.8 01/07/2021 1602   LYMPHSABS 2.5 11/16/2014 0735   MONOABS 0.5 11/16/2014 0735   EOSABS 0.4 11/16/2014 0735   BASOSABS 0.0 11/16/2014 0735    Lab Results  Component Value Date   HGBA1C 10.4 (A) 06/01/2022    Assessment & Plan:  1. Statin myopathy Currently on Repatha and Zetia Low-cholesterol diet  2. Type 2 diabetes mellitus with hyperglycemia, with long-term current use of insulin (HCC) Uncontrolled with A1c of 10.4, goal is less than 7.0 He will benefit from initiation of insulin but continues to oppose this Unfortunately due to cost and the fact that he is in the donut hole he is unable to obtain his Ozempic Would like to increase dose of glimepiride however he declines Medication nonadherence strongly suspected especially given that he has GI adverse effects with metformin Unable to tolerate SGLT2i Advised to keep upcoming appointment with endocrine Counseled on Diabetic diet, my plate method, 433 minutes of moderate intensity exercise/week Blood sugar logs with fasting goals of 80-120 mg/dl, random of less than 180 and in the event of sugars less than 60 mg/dl or greater than 400 mg/dl encouraged to notify the clinic. Advised on the need for annual eye exams, annual foot exams, Pneumonia vaccine. - Microalbumin/Creatinine Ratio, Urine  3. Cellulitis of left upper extremity He has had recurrence of this condition over the last 6 weeks Will place on doxycycline - Ambulatory referral to Infectious Disease - doxycycline (VIBRA-TABS) 100 MG tablet; Take 1 tablet (100 mg total) by mouth 2 (two) times daily.  Dispense: 20 tablet; Refill: 0  4. Hyperlipidemia associated with type 2 diabetes mellitus ([place]) Due to statin intolerance he is on Repatha Low-cholesterol diet Risk factor modification  5. Hypertension  associated with diabetes ([place]) Controlled Continue current antihypertensive Counseled on blood pressure goal of less than 130/80, low-sodium, DASH diet, medication compliance, 150 minutes of moderate intensity exercise per week. Discussed medication compliance, adverse effects.     Meds ordered this encounter  Medications   doxycycline (VIBRA-TABS) 100 MG tablet    Sig: Take 1 tablet (100 mg total) by mouth 2 (two) times daily.    Dispense:  20 tablet  Refill:  0    Follow-up: Return in about 3 months (around 09/11/2022) for Chronic medical conditions.       Charlott Rakes, MD, FAAFP. [facility] [place], [place]   06/12/2022, 4:25 PM

## 2022-06-12 NOTE — Patient Instructions (Signed)

## 2022-06-12 NOTE — Progress Notes (Signed)
Red spots on arms.

## 2022-06-13 LAB — MICROALBUMIN / CREATININE URINE RATIO
Creatinine, Urine: 76.6 mg/dL
Microalb/Creat Ratio: <4 mg/g creat (ref 0–29)
Microalbumin, Urine: <3 ug/mL

## 2022-06-20 ENCOUNTER — Telehealth: Payer: Self-pay | Admitting: Family Medicine

## 2022-06-20 DIAGNOSIS — M79675 Pain in left toe(s): Secondary | ICD-10-CM | POA: Diagnosis not present

## 2022-06-20 NOTE — Telephone Encounter (Signed)
Lisa with [employer] called to get lab results from last visit.  CB@  [phone]

## 2022-06-21 ENCOUNTER — Ambulatory Visit: Payer: Medicare Other | Attending: Cardiovascular Disease | Admitting: Cardiovascular Disease

## 2022-06-21 ENCOUNTER — Encounter: Payer: Self-pay | Admitting: Cardiovascular Disease

## 2022-06-21 ENCOUNTER — Ambulatory Visit: Payer: Medicare Other | Attending: Family Medicine | Admitting: Family Medicine

## 2022-06-21 ENCOUNTER — Encounter: Payer: Self-pay | Admitting: Family Medicine

## 2022-06-21 VITALS — BP 132/80 | HR 55 | Ht 74.0 in | Wt 259.0 lb

## 2022-06-21 VITALS — BP 137/81 | HR 65 | Ht 74.0 in | Wt 260.0 lb

## 2022-06-21 DIAGNOSIS — M7918 Myalgia, other site: Secondary | ICD-10-CM

## 2022-06-21 DIAGNOSIS — R0789 Other chest pain: Secondary | ICD-10-CM

## 2022-06-21 DIAGNOSIS — E782 Mixed hyperlipidemia: Secondary | ICD-10-CM

## 2022-06-21 DIAGNOSIS — I7121 Aneurysm of the ascending aorta, without rupture: Secondary | ICD-10-CM | POA: Diagnosis not present

## 2022-06-21 DIAGNOSIS — I10 Essential (primary) hypertension: Secondary | ICD-10-CM

## 2022-06-21 LAB — BASIC METABOLIC PANEL
BUN/Creatinine Ratio: 14 (ref 9–20)
BUN: 17 mg/dL (ref 6–24)
CO2: 25 mmol/L (ref 20–29)
Calcium: 9.7 mg/dL (ref 8.7–10.2)
Chloride: 97 mmol/L (ref 96–106)
Creatinine, Ser: 1.18 mg/dL (ref 0.76–1.27)
Glucose: 217 mg/dL — ABNORMAL HIGH (ref 70–99)
Potassium: 4.9 mmol/L (ref 3.5–5.2)
Sodium: 136 mmol/L (ref 134–144)
eGFR: 73 mL/min/{1.73_m2} (ref >59)

## 2022-06-21 MED ORDER — BACLOFEN 10 MG PO TABS: 10.0000 mg | ORAL_TABLET | Freq: Two times a day (BID) | ORAL | 0 refills | Status: DC | PRN

## 2022-06-21 NOTE — Assessment & Plan Note (Signed)
History of hyperlipidemia on Repatha and Zetia with lipid profile performed 12/13/2021 revealing total cholesterol 150, LDL 71 and HDL of 59.

## 2022-06-21 NOTE — Telephone Encounter (Signed)
Unfortunately I cannot provide results to [place].  Please call the patient and tell him I sent him his results via MyChart, the only labs done at his last visit with me was a microalbumin and it was normal.  Thank you

## 2022-06-21 NOTE — Assessment & Plan Note (Signed)
History of atypical chest pain now with fairly constant pain under his right axilla radiating to the right inframammary area.  Painful to the touch and is constant.  To be noted that he did have a coronary CTA performed 05/11/2017 that revealed a coronary calcium score of 0 and no evidence of CAD.  I do not feel that this is ischemically mediated.

## 2022-06-21 NOTE — Assessment & Plan Note (Signed)
History of thoracic aortic aneurysm measuring 42 mm in the past although his most recent CTA send 38 mm.  I am going to recheck a chest CTA to further evaluate.

## 2022-06-21 NOTE — Patient Instructions (Signed)
Muscle Pain, Adult Muscle pain, also called myalgia, is a condition in which a person has pain in one or more muscles in the body. Muscle pain may be mild, moderate, or severe. It may feel sharp, achy, or burning. In most cases, the pain lasts only a short time and goes away without treatment. Muscle pain can result from using muscles in a new or different way or after a period of inactivity. It is normal to feel some muscle pain after starting an exercise program. Muscles that have not been used often will be sore at first. What are the causes? This condition is caused by using muscles in a new or different way after a period of inactivity. Other causes may include: Overuse or muscle strain, especially if you are not in shape. This is the most common cause of muscle pain. Injury or bruising. Infectious diseases, including diseases caused by viruses, such as the flu (influenza). Fibromyalgia.This is a long-term, or chronic, condition that causes muscle tenderness, tiredness (fatigue), and headache. Autoimmune or rheumatologic diseases. These are conditions, such as lupus, in which the body's defense system (immunesystem) attacks areas in the body. Certain medicines, including ACE inhibitors and statins. What are the signs or symptoms? The main symptom of this condition is sore or painful muscles, including during activity and when stretching. You may also have slight swelling. How is this diagnosed? This condition is diagnosed with a physical exam. Your health care provider will ask questions about your pain and when it began. If you have not had muscle pain for very long, your health care provider may want to wait before doing much testing. If your muscle pain has lasted a long time, tests may be done right away. In some cases, this may include tests to rule out certain conditions or illnesses. How is this treated? Treatment for this condition depends on the cause. Home care is often enough to  relieve muscle pain. Your health care provider may also prescribe NSAIDs, such as ibuprofen. Follow these instructions at home: Medicines Take over-the-counter and prescription medicines only as told by your health care provider. Ask your health care provider if the medicine prescribed to you requires you to avoid driving or using machinery. Managing pain, swelling, and discomfort     If directed, put ice on the painful area. To do this: Put ice in a plastic bag. Place a towel between your skin and the bag. Leave the ice on for 20 minutes, 2-3 times a day. For the first 2 days of muscle soreness, or if there is swelling: Do not soak in hot baths. Do not use a hot tub, steam room, sauna, heating pad, or other heat source. After 48-72 hours, you may alternate between applying ice and applying heat as told by your health care provider. If directed, apply heat to the affected area as often as told by your health care provider. Use the heat source that your health care provider recommends, such as a moist heat pack or a heating pad. Place a towel between your skin and the heat source. Leave the heat on for 20-30 minutes. Remove the heat if your skin turns bright red. This is especially important if you are unable to feel pain, heat, or cold. You may have a greater risk of getting burned. If you have an injury, raise (elevate) the injured area above the level of your heart while you are sitting or lying down. Activity  If overuse is causing your muscle pain: Slow down   your activities until the pain goes away. Do regular, gentle exercises if you are not usually active. Warm up before exercising. Stretch before and after exercising. This can help lower the risk of muscle pain. Do not continue working out if the pain is severe. Severe pain could mean that you have injured a muscle. Do not lift anything that is heavier than 5-10 lb (2.3-4.5 kg), or the limit that you are told, until your health  care provider says that it is safe. Return to your normal activities as told by your health care provider. Ask your health care provider what activities are safe for you. General instructions Do not use any products that contain nicotine or tobacco, such as cigarettes, e-cigarettes, and chewing tobacco. These can delay healing. If you need help quitting, ask your health care provider. Keep all follow-up visits as told by your health care provider. This is important. Contact a health care provider if you have: Muscle pain that gets worse and medicines do not help. Muscle pain that lasts longer than 3 days. A rash or fever along with muscle pain. Muscle pain after a tick bite. Muscle pain while working out, even though you are in good physical condition. Redness, soreness, or swelling along with muscle pain. Muscle pain after starting a new medicine or changing the dose of a medicine. Get help right away if you have: Trouble breathing. Trouble swallowing. Muscle pain along with a stiff neck, fever, and vomiting. Severe muscle weakness or you cannot move part of your body. These symptoms may represent a serious problem that is an emergency. Do not wait to see if the symptoms will go away. Get medical help right away. Call your local emergency services (911 in the [place] Do not drive yourself to the hospital. Summary Muscle pain usually lasts only a short time and goes away without treatment. This condition is caused by using muscles in a new or different way after a period of inactivity. If your muscle pain lasts longer than 3 days, tell your health care provider. This information is not intended to replace advice given to you by your health care provider. Make sure you discuss any questions you have with your health care provider. Document Revised: 12/31/2020 Document Reviewed: 12/31/2020 Elsevier Patient Education  2023 [employer]  

## 2022-06-21 NOTE — Progress Notes (Signed)
Pain in back and around abdomen maybe shingles.

## 2022-06-21 NOTE — Progress Notes (Signed)
06/21/2022 Jose Bishop   09-30-1965  [id number]  Primary Physician Charlott Rakes, MD Primary Cardiologist: Lorretta Harp MD Lupe Carney, [place]  HPI:  Jose Bishop is a 56 y.o.  moderately overweight married African-American male father of 64, grandfather and one grandchild who was a truck driver and currently worked in [employer].  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  He self-referred for evaluation of new onset chest pain .I last saw him in the office 10/22/2020.  He has had 3 episodes that lasted days a time with radiation to his neck and left upper extremity. He really has no risk factors other than recently diagnosed type 2 diabetes.   I performed a Myoview stress test on him 02/02/2017 that showed apical ischemia subsequent coronary CTA 05/11/2017 showed essentially a coronary calcium score of 1 with no evidence of CAD.  He did however have a 42-minute millimeter ascending thoracic aorta.  He was admitted to [facility] apparently last year for "bradycardia" and PVCs.  He apparently wore a Zio patch at that time although the results are unavailable to me.  He had stress echo performed at [facility] 12/30/2018 which was entirely normal.   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  He smokes an occasional cigar.  He complains of some atypical constant pain in his right axilla radiating to his right chest and back which is painful to palpation.  His last chest CTA performed a year ago revealed an ascending aorta that measured 38 mm.   Current Meds  Medication Sig   APPLE CIDER VINEGAR PO Take by mouth.   baclofen (LIORESAL) 10 MG tablet Take 1 tablet (10 mg total) by mouth 2 (two) times daily between meals as needed for muscle spasms.   cetirizine (ZYRTEC) 10 MG tablet Take 1 tablet (10 mg total) by mouth daily.   chlorthalidone (HYGROTON) 25 MG tablet TAKE 1  TABLET(25 MG) BY MOUTH DAILY   doxycycline (VIBRA-TABS) 100 MG tablet Take 1 tablet (100 mg total) by mouth 2 (two) times daily.   Evolocumab with Infusor ([place]) 420 MG/3.5ML SOCT Inject 420 mg into the skin every 30 (thirty) days.   ezetimibe (ZETIA) 10 MG tablet TAKE 1 TABLET(10 MG) BY MOUTH DAILY   fluticasone (FLONASE) 50 MCG/ACT nasal spray Place 2 sprays into both nostrils daily.   gabapentin (NEURONTIN) 300 MG capsule TAKE 1 CAPSULE(300 MG) BY MOUTH AT BEDTIME   glimepiride (AMARYL) 2 MG tablet 2 tabs with breakfast and 1 tab with dinner   glucose blood (ONETOUCH VERIO) test strip 1 each by Other route daily. And lancets 1/day   latanoprost (XALATAN) 0.005 % ophthalmic solution Place 1 drop into both eyes at bedtime.   lisinopril (ZESTRIL) 30 MG tablet TAKE 1 TABLET(30 MG) BY MOUTH DAILY   metFORMIN (GLUCOPHAGE-XR) 500 MG 24 hr tablet TAKE 2 TABLETS(1000 MG) BY MOUTH IN THE MORNING AND AT BEDTIME   omeprazole (PRILOSEC) 40 MG capsule Take 1 capsule (40 mg total) by mouth in the morning.     Allergies  Allergen Reactions   Bee Venom Anaphylaxis   Dapagliflozin Pro-Metformin Er    Lipitor [Atorvastatin]     FATIGUED NAUSEA BRAIN FOG    Social History   Socioeconomic History   Marital status: Married    Spouse name: Not on file   Number  of children: 5   Years of education: Not on file   Highest education level: Associate degree: occupational, Hotel manager, or vocational program  Occupational History   Occupation: part time  Tobacco Use   Smoking status: Never   Smokeless tobacco: Never  Vaping Use   Vaping Use: Never used  Substance and Sexual Activity   Alcohol use: Yes    Alcohol/week: 0.0 standard drinks of alcohol    Comment: OCCASIONAL   Drug use: No   Sexual activity: Yes    Partners: Female  Other Topics Concern   Not on file  Social History Narrative   Not on file   Social Determinants of Health   Financial Resource Strain: Low Risk   (12/01/2021)   Overall Financial Resource Strain (CARDIA)    Difficulty of Paying Living Expenses: Not very hard  Food Insecurity: No Food Insecurity (12/01/2021)   Hunger Vital Sign    Worried About Running Out of Food in the Last Year: Never true    Ran Out of Food in the Last Year: Never true  Transportation Needs: No Transportation Needs (12/01/2021)   PRAPARE - Hydrologist (Medical): No    Lack of Transportation (Non-Medical): No  Physical Activity: Insufficiently Active (12/01/2021)   Exercise Vital Sign    Days of Exercise per Week: 3 days    Minutes of Exercise per Session: 20 min  Stress: Stress Concern Present (12/01/2021)   [place]    Feeling of Stress : To some extent  Social Connections: Moderately Integrated (12/01/2021)   Social Connection and Isolation Panel [NHANES]    Frequency of Communication with Friends and Family: Once a week    Frequency of Social Gatherings with Friends and Family: Once a week    Attends Religious Services: More than 4 times per year    Active Member of [employer] or Organizations: Yes    Attends Music therapist: More than 4 times per year    Marital Status: Married  Human resources officer Violence: Not At Risk (12/01/2021)   Humiliation, Afraid, Rape, and Kick questionnaire    Fear of Current or Ex-Partner: No    Emotionally Abused: No    Physically Abused: No    Sexually Abused: No     Review of Systems: General: negative for chills, fever, night sweats or weight changes.  Cardiovascular: negative for chest pain, dyspnea on exertion, edema, orthopnea, palpitations, paroxysmal nocturnal dyspnea or shortness of breath Dermatological: negative for rash Respiratory: negative for cough or wheezing Urologic: negative for hematuria Abdominal: negative for nausea, vomiting, diarrhea, bright red blood per rectum, melena, or hematemesis Neurologic: negative  for visual changes, syncope, or dizziness All other systems reviewed and are otherwise negative except as noted above.    Blood pressure 132/80, pulse (!) 55, height '6\' 2"'$  (1.88 m), weight 259 lb (117.5 kg), SpO2 99 %.  General appearance: alert and no distress Neck: no adenopathy, no carotid bruit, no JVD, supple, symmetrical, trachea midline, and thyroid not enlarged, symmetric, no tenderness/mass/nodules Lungs: clear to auscultation bilaterally Heart: regular rate and rhythm, S1, S2 normal, no murmur, click, rub or gallop Extremities: extremities normal, atraumatic, no cyanosis or edema Pulses: 2+ and symmetric Skin: Skin color, texture, turgor normal. No rashes or lesions Neurologic: Grossly normal  EKG sinus bradycardia 55 without ST or T wave changes.  Personally reviewed this EKG.  ASSESSMENT AND PLAN:   Atypical chest pain History of  atypical chest pain now with fairly constant pain under his right axilla radiating to the right inframammary area.  Painful to the touch and is constant.  To be noted that he did have a coronary CTA performed 05/11/2017 that revealed a coronary calcium score of 0 and no evidence of CAD.  I do not feel that this is ischemically mediated.  Hyperlipidemia History of hyperlipidemia on Repatha and Zetia with lipid profile performed 12/13/2021 revealing total cholesterol 150, LDL 71 and HDL of 59.  Thoracic aortic aneurysm (HCC) History of thoracic aortic aneurysm measuring 42 mm in the past although his most recent CTA send 38 mm.  I am going to recheck a chest CTA to further evaluate.  Benign essential HTN History of essential hypertension blood pressure measured today 132/80.  He is on chlorthalidone and lisinopril.     Lorretta Harp MD FACP,FACC,FAHA, [facility] 06/21/2022 9:47 AM

## 2022-06-21 NOTE — Assessment & Plan Note (Signed)
History of essential hypertension blood pressure measured today 132/80.  He is on chlorthalidone and lisinopril.

## 2022-06-21 NOTE — Patient Instructions (Signed)
Medication Instructions:  Your physician recommends that you continue on your current medications as directed. Please refer to the Current Medication list given to you today.  *If you need a refill on your cardiac medications before your next appointment, please call your pharmacy*   Lab Work: Your physician recommends that you have labs drawn today: BMET  If you have labs (blood work) drawn today and your tests are completely normal, you will receive your results only by: [place] (if you have MyChart) OR A paper copy in the mail If you have any lab test that is abnormal or we need to change your treatment, we will call you to review the results.   Testing/Procedures: Non-Cardiac chest/aorta CT Angiography (CTA), is a special type of CT scan that uses a computer to produce multi-dimensional views of major blood vessels throughout the body. In CT angiography, a contrast material is injected through an IV to help visualize the blood vessels This procedure will be done at [place], [place] [address]     Follow-Up: At [facility], you and your health needs are our priority.  As part of our continuing mission to provide you with exceptional heart care, we have created designated Provider Care Teams.  These Care Teams include your primary Cardiologist (physician) and Advanced Practice Providers (APPs -  Physician Assistants and Nurse Practitioners) who all work together to provide you with the care you need, when you need it.  We recommend signing up for the patient portal called "MyChart".  Sign up information is provided on this After Visit Summary.  MyChart is used to connect with patients for Virtual Visits (Telemedicine).  Patients are able to view lab/test results, encounter notes, upcoming appointments, etc.  Non-urgent messages can be sent to your provider as well.   To learn more about what you can do with MyChart, go to [email or web address].     Your next appointment:   12 month(s)  The format for your next appointment:   In Person  Provider:   Quay Burow, MD

## 2022-06-21 NOTE — Progress Notes (Signed)
Subjective:  Patient ID: Jose Bishop, male    DOB: Oct 29, 1965  Age: 56 y.o. MRN: [id number]  CC: Rash   HPI Jose Bishop is a 56 y.o. year old male with a history of type 2 diabetes (A1c 10.4 managed by endocrine), hypertension, depression, DDD of lumbar spine, dilatation of ascending aorta.     Interval History: He Complains of pain from the right side of his back around his armpit to his right anterior chest wall x> 1 week. He has no rash, bump and has no burning.  Pain is persistent and described as moderate. Pain feels like a 'bruise, like he got hit'.  He had shingles several months ago on the left side of his lower back and this feels nothing like it.  Denies presence of other symptoms and has no dyspnea, palpitations, lightheadedness. He did see his cardiologist this morning who stated his chest pain was not of cardiac origin. Past Medical History:  Diagnosis Date   AAA (abdominal aortic aneurysm) (HCC)    Anxiety    Bradycardia    Bradycardia    Difficulty sleeping    Essential hypertension    Family history of stroke    Headache    History of transient ischemic attack (TIA)    15 YRS AGO   Hyperlipidemia    Impotence of organic origin    Liver cyst    Memory loss    Osteoarthritis of acromioclavicular joint    Prostate cancer (HCC)    PVC (premature ventricular contraction)    Type 2 diabetes mellitus with hyperglycemia ([place])     Past Surgical History:  Procedure Laterality Date   HERNIA REPAIR     X2 ( 1 ING HERNIA / 1 UMBILICAL HERNIA )   LYMPHADENECTOMY Bilateral 01/04/2015   Procedure: BILATERAL LYMPHADENECTOMY;  Surgeon: Raynelle Bring, MD;  Location: [facility] ORS;  Service: Urology;  Laterality: Bilateral;   MASS EXCISION     L UPPER ARM   PENILE PROSTHESIS IMPLANT  03/18/2019   ROBOT ASSISTED LAPAROSCOPIC RADICAL PROSTATECTOMY N/A 01/04/2015   Procedure: ROBOTIC ASSISTED LAPAROSCOPIC RADICAL PROSTATECTOMY LEVEL 2;  Surgeon: Raynelle Bring, MD;  Location: [facility]  ORS;  Service: Urology;  Laterality: N/A;   ROTATOR CUFF REPAIR Bilateral    SURGERY SCROTAL / TESTICULAR      Family History  Problem Relation Age of Onset   Anuerysm Mother    Stroke Mother    Hypertension Father    Peripheral vascular disease Father    Stroke Father    Colon cancer Father        19's   Diabetes Sister    Heart murmur Sister    Heart murmur Son    Stomach cancer Neg Hx    Rectal cancer Neg Hx    Throat cancer Neg Hx     Social History   Socioeconomic History   Marital status: Married    Spouse name: Not on file   Number of children: 5   Years of education: Not on file   Highest education level: Associate degree: occupational, Hotel manager, or vocational program  Occupational History   Occupation: part time  Tobacco Use   Smoking status: Never   Smokeless tobacco: Never  Vaping Use   Vaping Use: Never used  Substance and Sexual Activity   Alcohol use: Yes    Alcohol/week: 0.0 standard drinks of alcohol    Comment: OCCASIONAL   Drug use: No   Sexual activity: Yes  Partners: Female  Other Topics Concern   Not on file  Social History Narrative   Not on file   Social Determinants of Health   Financial Resource Strain: Low Risk  (12/01/2021)   Overall Financial Resource Strain (CARDIA)    Difficulty of Paying Living Expenses: Not very hard  Food Insecurity: No Food Insecurity (12/01/2021)   Hunger Vital Sign    Worried About Running Out of Food in the Last Year: Never true    Ran Out of Food in the Last Year: Never true  Transportation Needs: No Transportation Needs (12/01/2021)   PRAPARE - Hydrologist (Medical): No    Lack of Transportation (Non-Medical): No  Physical Activity: Insufficiently Active (12/01/2021)   Exercise Vital Sign    Days of Exercise per Week: 3 days    Minutes of Exercise per Session: 20 min  Stress: Stress Concern Present (12/01/2021)   [place]    Feeling of Stress : To some extent  Social Connections: Moderately Integrated (12/01/2021)   Social Connection and Isolation Panel [NHANES]    Frequency of Communication with Friends and Family: Once a week    Frequency of Social Gatherings with Friends and Family: Once a week    Attends Religious Services: More than 4 times per year    Active Member of [employer] or Organizations: Yes    Attends Music therapist: More than 4 times per year    Marital Status: Married    Allergies  Allergen Reactions   Bee Venom Anaphylaxis   Dapagliflozin Pro-Metformin Er    Lipitor [Atorvastatin]     FATIGUED NAUSEA BRAIN FOG    Outpatient Medications Prior to Visit  Medication Sig Dispense Refill   APPLE CIDER VINEGAR PO Take by mouth.     cetirizine (ZYRTEC) 10 MG tablet Take 1 tablet (10 mg total) by mouth daily. 30 tablet 11   chlorthalidone (HYGROTON) 25 MG tablet TAKE 1 TABLET(25 MG) BY MOUTH DAILY 90 tablet 1   doxycycline (VIBRA-TABS) 100 MG tablet Take 1 tablet (100 mg total) by mouth 2 (two) times daily. 20 tablet 0   Evolocumab with Infusor ([place]) 420 MG/3.5ML SOCT Inject 420 mg into the skin every 30 (thirty) days. 3.6 mL 11   ezetimibe (ZETIA) 10 MG tablet TAKE 1 TABLET(10 MG) BY MOUTH DAILY 90 tablet 1   fluticasone (FLONASE) 50 MCG/ACT nasal spray Place 2 sprays into both nostrils daily. 16 g 6   gabapentin (NEURONTIN) 300 MG capsule TAKE 1 CAPSULE(300 MG) BY MOUTH AT BEDTIME 30 capsule 3   glimepiride (AMARYL) 2 MG tablet 2 tabs with breakfast and 1 tab with dinner 270 tablet 1   glucose blood (ONETOUCH VERIO) test strip 1 each by Other route daily. And lancets 1/day 100 each 3   latanoprost (XALATAN) 0.005 % ophthalmic solution Place 1 drop into both eyes at bedtime.     lisinopril (ZESTRIL) 30 MG tablet TAKE 1 TABLET(30 MG) BY MOUTH DAILY 90 tablet 1   metFORMIN (GLUCOPHAGE-XR) 500 MG 24 hr tablet TAKE 2 TABLETS(1000 MG) BY MOUTH  IN THE MORNING AND AT BEDTIME 360 tablet 1   omeprazole (PRILOSEC) 40 MG capsule Take 1 capsule (40 mg total) by mouth in the morning. 30 capsule 11   baclofen (LIORESAL) 10 MG tablet Take 1 tablet (10 mg total) by mouth 2 (two) times daily between meals as needed for muscle spasms. 60 each  0   No facility-administered medications prior to visit.     ROS Review of Systems  Constitutional:  Negative for activity change and appetite change.  HENT:  Negative for sinus pressure and sore throat.   Respiratory:  Negative for chest tightness, shortness of breath and wheezing.   Cardiovascular:  Negative for chest pain and palpitations.  Gastrointestinal:  Negative for abdominal distention, abdominal pain and constipation.  Genitourinary: Negative.   Musculoskeletal:  Positive for myalgias.  Psychiatric/Behavioral:  Negative for behavioral problems and dysphoric mood.     Objective:  BP 137/81   Pulse 65   Ht '6\' 2"'$  (1.88 m)   Wt 260 lb (117.9 kg)   SpO2 99%   BMI 33.38 kg/m      06/21/2022   11:37 AM 06/21/2022    9:28 AM 06/12/2022    2:49 PM  BP/Weight  Systolic BP 951 884 166  Diastolic BP 81 80 76  Wt. (Lbs) 260 259 257.8  BMI 33.38 kg/m2 33.25 kg/m2 32.22 kg/m2      Physical Exam Constitutional:      Appearance: He is well-developed.  Cardiovascular:     Rate and Rhythm: Normal rate.     Heart sounds: Normal heart sounds. No murmur heard. Pulmonary:     Effort: Pulmonary effort is normal.     Breath sounds: Normal breath sounds. No wheezing or rales.  Chest:     Chest wall: No tenderness.  Abdominal:     General: Bowel sounds are normal. There is no distension.     Palpations: Abdomen is soft. There is no mass.     Tenderness: There is no abdominal tenderness.  Musculoskeletal:        General: Normal range of motion.     Right lower leg: No edema.     Left lower leg: No edema.     Comments: Palpation of right thoracic region around to anterior right chest  wall with slight reproducible pain.  No lesions identified, no hyperesthesia.  Neurological:     Mental Status: He is alert and oriented to person, place, and time.  Psychiatric:        Mood and Affect: Mood normal.        Latest Ref Rng & Units 06/01/2022   11:57 AM 12/21/2021    9:07 AM 08/22/2021   11:53 AM  CMP  Glucose 70 - 99 mg/dL 215   132   BUN 6 - 24 mg/dL 26   16   Creatinine 0.76 - 1.27 mg/dL 1.33   1.07   Sodium 134 - 144 mmol/L 132   135   Potassium 3.5 - 5.2 mmol/L 4.4   4.4   Chloride 96 - 106 mmol/L 94   96   CO2 20 - 29 mmol/L 19   25   Calcium 8.7 - 10.2 mg/dL 10.0   9.6   Total Protein 6.0 - 8.5 g/dL  7.5  7.5   Total Bilirubin 0.0 - 1.2 mg/dL  0.7  0.5   Alkaline Phos 44 - 121 IU/L  42  49   AST 0 - 40 IU/L  19  16   ALT 0 - 44 IU/L  21  18     Lipid Panel     Component Value Date/Time   CHOL 150 12/21/2021 0906   TRIG 113 12/21/2021 0906   HDL 59 12/21/2021 0906   CHOLHDL 2.5 12/21/2021 0906   LDLCALC 71 12/21/2021 0906   LDLDIRECT 145 (H)  01/06/2020 1046    CBC    Component Value Date/Time   WBC 5.0 01/07/2021 1602   WBC 4.4 04/30/2019 1424   RBC 4.70 01/07/2021 1602   RBC 4.80 04/30/2019 1424   HGB 14.2 01/07/2021 1602   HCT 42.9 01/07/2021 1602   PLT 352 01/07/2021 1602   MCV 91 01/07/2021 1602   MCH 30.2 01/07/2021 1602   MCH 30.4 04/30/2019 1424   MCHC 33.1 01/07/2021 1602   MCHC 32.7 04/30/2019 1424   RDW 12.8 01/07/2021 1602   LYMPHSABS 2.5 11/16/2014 0735   MONOABS 0.5 11/16/2014 0735   EOSABS 0.4 11/16/2014 0735   BASOSABS 0.0 11/16/2014 0735    Lab Results  Component Value Date   HGBA1C 10.4 (A) 06/01/2022    Assessment & Plan:  1. Muscle pain, myofascial Distribution of pain is typical for shingles however he has no burning and no rash. He is outside of the window for treatment with antiviral medication even if he did have shingles Advised to apply Lidoderm patch I will place him on a muscle relaxant. - baclofen  (LIORESAL) 10 MG tablet; Take 1 tablet (10 mg total) by mouth 2 (two) times daily between meals as needed for muscle spasms.  Dispense: 60 each; Refill: 0    Meds ordered this encounter  Medications   baclofen (LIORESAL) 10 MG tablet    Sig: Take 1 tablet (10 mg total) by mouth 2 (two) times daily between meals as needed for muscle spasms.    Dispense:  60 each    Refill:  0    Follow-up: Return for previously scheduled appointment.       Charlott Rakes, MD, FAAFP. [facility], [place]   06/21/2022, 12:12 PM

## 2022-06-22 ENCOUNTER — Ambulatory Visit (INDEPENDENT_AMBULATORY_CARE_PROVIDER_SITE_OTHER): Payer: Medicare Other

## 2022-06-22 ENCOUNTER — Encounter: Payer: Self-pay | Admitting: Emergency Medicine

## 2022-06-22 ENCOUNTER — Other Ambulatory Visit: Payer: Self-pay

## 2022-06-22 ENCOUNTER — Ambulatory Visit
Admission: EM | Admit: 2022-06-22 | Discharge: 2022-06-22 | Disposition: A | Discharge status: Home / Self Care | Payer: Medicare Other | Attending: Family Medicine | Admitting: Family Medicine

## 2022-06-22 DIAGNOSIS — R079 Chest pain, unspecified: Secondary | ICD-10-CM

## 2022-06-22 DIAGNOSIS — R0789 Other chest pain: Secondary | ICD-10-CM | POA: Diagnosis not present

## 2022-06-22 MED ORDER — IBUPROFEN 600 MG PO TABS: 600.0000 mg | ORAL_TABLET | Freq: Three times a day (TID) | ORAL | 0 refills | Status: AC | PRN

## 2022-06-22 NOTE — ED Provider Notes (Signed)
[facility]    CSN: [id number] Arrival date & time: 06/22/22  1059      History   Chief Complaint Chief Complaint  Patient presents with   Chest Pain    HPI Jose Bishop is a 56 y.o. male.    Chest Pain  Here for a 10-day history of right-sided chest pain --it bothers him anteriorly and posteriorly.  No fever or cough or congestion.  No shortness of breath.  It definitely bothers him more with movement of his right arm, and it sometimes tweaks and hurts more when he is breathing.   Is seen cardiology and EKG was benign.  He does have a CT chest angio set up to evaluate his aneurysm for next week.  He has seen his internist and is not satisfied with the muscle relaxer and Lidoderm recommendation that she made.  He states baclofen has not helped, and he would like an x-ray.  Past Medical History:  Diagnosis Date   AAA (abdominal aortic aneurysm) (HCC)    Anxiety    Bradycardia    Bradycardia    Difficulty sleeping    Essential hypertension    Family history of stroke    Headache    History of transient ischemic attack (TIA)    15 YRS AGO   Hyperlipidemia    Impotence of organic origin    Liver cyst    Memory loss    Osteoarthritis of acromioclavicular joint    Prostate cancer ([place])    PVC (premature ventricular contraction)    Type 2 diabetes mellitus with hyperglycemia ([place])     Patient Active Problem List   Diagnosis Date Noted   Statin myopathy 06/12/2022   Sciatic nerve pain, left 05/17/2022   Recurrent infection of skin 05/17/2022   Moderate episode of recurrent major depressive disorder ([place]) 03/08/2022   GAD (generalized anxiety disorder) 03/08/2022   Acute non-recurrent maxillary sinusitis 05/04/2021   Elevated blood pressure reading with diagnosis of hypertension 10/28/2020   Low back pain 09/27/2020   Neck pain 09/27/2020   DDD (degenerative disc disease), cervical 04/17/2020   Degeneration of lumbar intervertebral disc 04/17/2020    Type 2 diabetes mellitus with hyperglycemia, with long-term current use of insulin ([place]) 10/06/2019   Benign essential HTN 10/06/2019   Thoracic aortic aneurysm ([place]) 09/16/2019   ED (erectile dysfunction) of organic origin 01/06/2019   History of repair of rotator cuff 04/16/2018   Hyperlipidemia 04/18/2017   Atypical chest pain 01/24/2017   Prostate cancer ([place]) 01/04/2015   History of recurrent TIAs 10/02/2014   Family history of cerebral aneurysm 10/02/2014    Past Surgical History:  Procedure Laterality Date   HERNIA REPAIR     X2 ( 1 ING HERNIA / 1 UMBILICAL HERNIA )   LYMPHADENECTOMY Bilateral 01/04/2015   Procedure: BILATERAL LYMPHADENECTOMY;  Surgeon: Raynelle Bring, MD;  Location: [facility];  Service: Urology;  Laterality: Bilateral;   MASS EXCISION     L UPPER ARM   PENILE PROSTHESIS IMPLANT  03/18/2019   ROBOT ASSISTED LAPAROSCOPIC RADICAL PROSTATECTOMY N/A 01/04/2015   Procedure: ROBOTIC ASSISTED LAPAROSCOPIC RADICAL PROSTATECTOMY LEVEL 2;  Surgeon: Raynelle Bring, MD;  Location: [facility];  Service: Urology;  Laterality: N/A;   ROTATOR CUFF REPAIR Bilateral    SURGERY SCROTAL / TESTICULAR         Home Medications    Prior to Admission medications   Medication Sig Start Date End Date Taking? Authorizing Provider  ibuprofen (ADVIL) 600 MG tablet  Take 1 tablet (600 mg total) by mouth every 8 (eight) hours as needed (pain). 06/22/22  Yes Barrett Henle, MD  APPLE CIDER VINEGAR PO Take by mouth.    [provider]  cetirizine (ZYRTEC) 10 MG tablet Take 1 tablet (10 mg total) by mouth daily. 07/12/21   Mayers, Cari S, PA-C  chlorthalidone (HYGROTON) 25 MG tablet TAKE 1 TABLET(25 MG) BY MOUTH DAILY 06/01/22   Thereasa Solo, Dionne Bucy, PA-C  Evolocumab with Infusor ([place]) 420 MG/3.5ML SOCT Inject 420 mg into the skin every 30 (thirty) days. 10/10/21   Lenna Sciara, NP  ezetimibe (ZETIA) 10 MG tablet TAKE 1 TABLET(10 MG) BY MOUTH DAILY 06/01/22   Freeman Caldron M, PA-C  fluticasone [facility]) 50 MCG/ACT nasal spray Place 2 sprays into both nostrils daily. 04/19/21   Fenton Foy, NP  gabapentin (NEURONTIN) 300 MG capsule TAKE 1 CAPSULE(300 MG) BY MOUTH AT BEDTIME 06/01/22   Freeman Caldron M, PA-C  glimepiride (AMARYL) 2 MG tablet 2 tabs with breakfast and 1 tab with dinner 06/01/22   Argentina Donovan, PA-C  glucose blood (ONETOUCH VERIO) test strip 1 each by Other route daily. And lancets 1/day 07/19/21   Renato Shin, MD  latanoprost (XALATAN) 0.005 % ophthalmic solution Place 1 drop into both eyes at bedtime.    [provider]  lisinopril (ZESTRIL) 30 MG tablet TAKE 1 TABLET(30 MG) BY MOUTH DAILY 06/01/22   Freeman Caldron M, PA-C  metFORMIN (GLUCOPHAGE-XR) 500 MG 24 hr tablet TAKE 2 TABLETS(1000 MG) BY MOUTH IN THE MORNING AND AT BEDTIME 06/01/22   Freeman Caldron M, PA-C  omeprazole (PRILOSEC) 40 MG capsule Take 1 capsule (40 mg total) by mouth in the morning. 08/11/21   Willia Craze, NP    Family History Family History  Problem Relation Age of Onset   Anuerysm Mother    Stroke Mother    Hypertension Father    Peripheral vascular disease Father    Stroke Father    Colon cancer Father        44's   Diabetes Sister    Heart murmur Sister    Heart murmur Son    Stomach cancer Neg Hx    Rectal cancer Neg Hx    Throat cancer Neg Hx     Social History Social History   Tobacco Use   Smoking status: Never   Smokeless tobacco: Never  Vaping Use   Vaping Use: Never used  Substance Use Topics   Alcohol use: Yes    Alcohol/week: 0.0 standard drinks of alcohol    Comment: OCCASIONAL   Drug use: No     Allergies   Bee venom, Dapagliflozin pro-metformin er, and Lipitor [atorvastatin]   Review of Systems Review of Systems  Cardiovascular:  Positive for chest pain.     Physical Exam Triage Vital Signs ED Triage Vitals [06/22/22 1207]  Enc Vitals Group     BP 116/76     Pulse Rate (!) 59     Resp 18      Temp 98.1 F (36.7 C)     Temp Source Oral     SpO2 95 %     Weight      Height      Head Circumference      Peak Flow      Pain Score 5     Pain Loc      Pain Edu?      Excl. in [place]?  No data found.  Updated Vital Signs BP 116/76 (BP Location: Left Arm)   Pulse (!) 59   Temp 98.1 F (36.7 C) (Oral)   Resp 18   SpO2 95%   Visual Acuity Right Eye Distance:   Left Eye Distance:   Bilateral Distance:    Right Eye Near:   Left Eye Near:    Bilateral Near:     Physical Exam Vitals reviewed.  Constitutional:      General: He is not in acute distress.    Appearance: He is not ill-appearing, toxic-appearing or diaphoretic.  HENT:     Mouth/Throat:     Mouth: Mucous membranes are moist.  Eyes:     Extraocular Movements: Extraocular movements intact.     Conjunctiva/sclera: Conjunctivae normal.     Pupils: Pupils are equal, round, and reactive to light.  Cardiovascular:     Rate and Rhythm: Normal rate and regular rhythm.     Heart sounds: No murmur heard. Pulmonary:     Effort: Pulmonary effort is normal. No respiratory distress.     Breath sounds: No stridor. No wheezing, rhonchi or rales.  Chest:     Chest wall: Tenderness (right anterior and right posterior tenderness) present.  Musculoskeletal:     Cervical back: Neck supple.  Lymphadenopathy:     Cervical: No cervical adenopathy.  Skin:    Coloration: Skin is not pale.     Findings: No rash.  Neurological:     Mental Status: He is alert and oriented to person, place, and time.  Psychiatric:        Behavior: Behavior normal.      UC Treatments / Results  Labs (all labs ordered are listed, but only abnormal results are displayed) Labs Reviewed - No data to display  EKG   Radiology DG Chest 2 View  Result Date: 06/22/2022 CLINICAL DATA:  Right chest pain EXAM: CHEST - 2 VIEW COMPARISON:  Previous chest radiographs done on 04/30/2019, CT chest done on 11/01/2020 FINDINGS: Cardiac size is within  normal limits. There are no signs of pulmonary edema or focal pulmonary consolidation. There is no pleural effusion or pneumothorax. IMPRESSION: No active cardiopulmonary disease. Electronically Signed   By: Elmer Picker M.D.   On: 06/22/2022 12:35    Procedures Procedures (including critical care time)  Medications Ordered in UC Medications - No data to display  Initial Impression / Assessment and Plan / UC Course  I have reviewed the triage vital signs and the nursing notes.  Pertinent labs & imaging results that were available during my care of the patient were reviewed by me and considered in my medical decision making (see chart for details).        I have discussed with the patient that he will have a more adequate evaluation of his chest with a CT chest angio that is scheduled for 8 days from now.  He is reluctant to wait that period of time to have x-ray evaluation, though I did discuss with him he is already waited 10 days to be evaluated  Chest x-ray is benign within normal radiology reading.  His EGFR has been normal, including in care everywhere in November of this year.  He is therefore prescribed some ibuprofen to take in the short-term.  Will have more complete evaluation of his chest with a CT chest angio that is coming up     Final Clinical Impressions(s) / UC Diagnoses   Final diagnoses:  Chest wall pain  Discharge Instructions      Your chest x-ray is normal and does not show any pathology.  Take ibuprofen 600 mg--1 tab every 8 hours as needed for pain.  Try the Lidoderm patches previously recommended by your doctor  Keep your appointment for your CT chest coming up      ED Prescriptions     Medication Sig Dispense Auth. Provider   ibuprofen (ADVIL) 600 MG tablet Take 1 tablet (600 mg total) by mouth every 8 (eight) hours as needed (pain). 15 tablet Sander Speckman, Gwenlyn Perking, MD      PDMP not reviewed this encounter.   Barrett Henle, MD 06/22/22 1249

## 2022-06-22 NOTE — ED Triage Notes (Signed)
Pt here for right sided chest pain into back x 10 days; pt sts worse with movement

## 2022-06-22 NOTE — Discharge Instructions (Addendum)
Your chest x-ray is normal and does not show any pathology.  Take ibuprofen 600 mg--1 tab every 8 hours as needed for pain.  Try the Lidoderm patches previously recommended by your doctor  Keep your appointment for your CT chest coming up

## 2022-06-28 ENCOUNTER — Ambulatory Visit: Payer: Medicare Other | Admitting: Internal Medicine

## 2022-06-28 ENCOUNTER — Encounter: Payer: Self-pay | Admitting: Internal Medicine

## 2022-06-28 ENCOUNTER — Other Ambulatory Visit: Payer: Self-pay

## 2022-06-28 VITALS — BP 122/85 | HR 58 | Temp 98.0°F | Ht 74.0 in | Wt 261.8 lb

## 2022-06-28 DIAGNOSIS — L089 Local infection of the skin and subcutaneous tissue, unspecified: Secondary | ICD-10-CM

## 2022-06-28 MED ORDER — MUPIROCIN 2 % EX OINT: 1.0000 | TOPICAL_OINTMENT | Freq: Two times a day (BID) | CUTANEOUS | 1 refills | Status: DC

## 2022-06-28 MED ORDER — CHLORHEXIDINE GLUCONATE 4 % EX LIQD: Freq: Every day | CUTANEOUS | 1 refills | Status: DC

## 2022-06-28 NOTE — Progress Notes (Signed)
[facility]      Reason for Consult:recurrent skin boils    Referring Physician: Dr. Margarita Rana    Patient ID: Jose Bishop, male    DOB: March 12, 1966, 57 y.o.   MRN: [id number]  HPI:   He is here for evaluation of recurrent skin infection.   He has had issues for over one month. Initially started on his right arm associated with boils and erythema with a head at the center of the boil.  Some white discharge noted.  He has been on multiple antibiotic courses.  He then had recurrence on his left arm then again on the right arm.  He has a small area on his right arm now.  He noted recurrence after stopping antibiotics.  He did start bactroban for his nose.  No fever.    Past Medical History:  Diagnosis Date   AAA (abdominal aortic aneurysm) (HCC)    Anxiety    Bradycardia    Bradycardia    Difficulty sleeping    Essential hypertension    Family history of stroke    Headache    History of transient ischemic attack (TIA)    15 YRS AGO   Hyperlipidemia    Impotence of organic origin    Liver cyst    Memory loss    Osteoarthritis of acromioclavicular joint    Prostate cancer (HCC)    PVC (premature ventricular contraction)    Type 2 diabetes mellitus with hyperglycemia ([place])     Prior to Admission medications   Medication Sig Start Date End Date Taking? Authorizing Provider  APPLE CIDER VINEGAR PO Take by mouth.   Yes [provider]  BLACK CURRANT SEED OIL PO Take by mouth.   Yes [provider]  cetirizine (ZYRTEC) 10 MG tablet Take 1 tablet (10 mg total) by mouth daily. 07/12/21  Yes Mayers, Cari S, PA-C  chlorhexidine (HIBICLENS) 4 % external liquid Apply topically daily. For 5 days 06/28/22  Yes Rebekka Lobello, Okey Regal, MD  chlorthalidone (HYGROTON) 25 MG tablet TAKE 1 TABLET(25 MG) BY MOUTH DAILY 06/01/22  Yes McClung, Angela M, PA-C  CINNAMON PO Take by mouth.   Yes [provider]  Evolocumab with Infusor ([place]) 420 MG/3.5ML SOCT Inject 420 mg into the skin every 30 (thirty) days. 10/10/21  Yes Monge, Helane Gunther, NP  ezetimibe (ZETIA) 10 MG tablet TAKE 1 TABLET(10 MG) BY MOUTH DAILY 06/01/22  Yes McClung, Angela M, PA-C  fluticasone (FLONASE) 50 MCG/ACT nasal spray Place 2 sprays into both nostrils daily. 04/19/21  Yes Fenton Foy, NP  gabapentin (NEURONTIN) 300 MG capsule TAKE 1 CAPSULE(300 MG) BY MOUTH AT BEDTIME 06/01/22  Yes McClung, Angela M, PA-C  Ginger, Zingiber officinalis, (GINGER PO) Take by mouth.   Yes [provider]  glimepiride (AMARYL) 2 MG tablet 2 tabs with breakfast and 1 tab with dinner 06/01/22  Yes McClung, Angela M, PA-C  glucose blood (ONETOUCH VERIO) test strip 1 each by Other route daily. And lancets 1/day 07/19/21  Yes Renato Shin, MD  ibuprofen (ADVIL) 600 MG tablet Take 1 tablet (600 mg total) by mouth every 8 (eight) hours as needed (pain). 06/22/22  Yes Banister, Gwenlyn Perking, MD  latanoprost (XALATAN) 0.005 % ophthalmic solution Place 1 drop into both eyes at bedtime.   Yes [provider]  lisinopril (ZESTRIL) 30 MG tablet TAKE 1 TABLET(30 MG) BY MOUTH DAILY 06/01/22  Yes McClung, Angela M, PA-C  metFORMIN (  GLUCOPHAGE-XR) 500 MG 24 hr tablet TAKE 2 TABLETS(1000 MG) BY MOUTH IN THE MORNING AND AT BEDTIME 06/01/22  Yes McClung, Angela M, PA-C  mupirocin ointment (BACTROBAN) 2 % Place 1 Application into the nose 2 (two) times daily. For 5 days 06/28/22  Yes [place] Savini, Okey Regal, MD  omeprazole (PRILOSEC) 40 MG capsule Take 1 capsule (40 mg total) by mouth in the morning. Patient taking differently: Take 40 mg by mouth daily as needed. 08/11/21  Yes Willia Craze, NP  OVER THE COUNTER MEDICATION BEET SUPPLEMENT   Yes [provider]  TURMERIC PO Take by mouth.   Yes [provider]    Allergies  Allergen Reactions   Bee Venom Anaphylaxis   Dapagliflozin Pro-Metformin Er    Lipitor [Atorvastatin]     FATIGUED NAUSEA BRAIN FOG    Social  History   Tobacco Use   Smoking status: Never   Smokeless tobacco: Never  Vaping Use   Vaping Use: Never used  Substance Use Topics   Alcohol use: Yes    Alcohol/week: 0.0 standard drinks of alcohol    Comment: OCCASIONAL   Drug use: No    Family History  Problem Relation Age of Onset   Anuerysm Mother    Stroke Mother    Hypertension Father    Peripheral vascular disease Father    Stroke Father    Colon cancer Father        71's   Diabetes Sister    Heart murmur Sister    Heart murmur Son    Stomach cancer Neg Hx    Rectal cancer Neg Hx    Throat cancer Neg Hx     Review of Systems  Constitutional: negative for fevers and chills All other systems reviewed and are negative    Constitutional: in no apparent distress  Vitals:   06/28/22 0903  BP: 122/85  Pulse: (!) 58  Temp: 98 F (36.7 C)  SpO2: 100%   EYES: anicteric Respiratory: no respiratory distress Skin: right arm with a small, erythematous macule with a small central bump + hyperpigmented scar at previous boil  Labs: Lab Results  Component Value Date   WBC 5.0 01/07/2021   HGB 14.2 01/07/2021   HCT 42.9 01/07/2021   MCV 91 01/07/2021   PLT 352 01/07/2021    Lab Results  Component Value Date   CREATININE 1.18 06/21/2022   BUN 17 06/21/2022   NA 136 06/21/2022   K 4.9 06/21/2022   CL 97 06/21/2022   CO2 25 06/21/2022    Lab Results  Component Value Date   ALT 21 12/21/2021   AST 19 12/21/2021   ALKPHOS 42 (L) 12/21/2021   BILITOT 0.7 12/21/2021   INR 1.06 11/16/2014     Assessment: Probable MRSA colonization with boils. I discussed the nature of MRSA and boils and association with nasal colonization.  I recommended hibiclens washes and bactroban for 1 week followed by a bleach bath weekly x 2 or 3.   Instructions provided  Plan: 1)  MRSA decolonization protocol Follow up as needed

## 2022-06-28 NOTE — Patient Instructions (Signed)
[facility] Five day Treatment Plan for MRSA (Staph) Decolonization  Please let us know if you have questions or concerns or do not understand the information we give you.  Prepare Chose a period when you will be uninterrupted by going away or other distractions. To ensure that your skin is in good condition, follow the Routine Skin Care principles below to reduce drying and enhance healing. Do not start while you have any active boils. Routine Skin Care principles to reduce drying and enhance healing: Avoid the use of soap when bathing or showering when performing this decolonization. DO NOT routinely use antiseptic solutions. If you need to use something, chose a soap substitute (examples -QV Wash or Cetaphil).  When drying with a towel, be gentle and pat dry your skin. Avoid rubbing the skin.  Reduce the overall frequency of bathing or showering. A short shower (3 minutes) is better than a bath in terms of its effect on the skin.  Use a simple sorbelene based-cream on your skin prior to showering and immediately after drying. (Examples: Hydroderm or other Sorbelene-based preparations). Especially protect healing or dry areas of skin in this way. Don't use a barrier cream with a vaseline base or with perfumes and additives. You are more likely to be allergic to these products, and cause further damage to your skin.  Make sure that you clean and cover any skin cuts or grazes that occur. Try to avoid picking or biting fingernails and the skin around the nails Keep your fingernails clipped short and clean to reduce problems caused by scratching.  For itchy skin, try gently massaging sorbelene-based cream into itchy areas instead of scratching it. A long-acting non-sedating antihistamine drug is the next option to try. However make sure that you are not taking medication that will interact with this drug type.                                                                          To prepare yourself for your treatment, it is recommended that you complete the following steps: Remove nose, ear and other body piercing items for several days prior to the treatment and keep them out during the treatment period  Purchase a new toothbrush, disposable razor (if used), sterident for dentures (if required) and a container of alcohol hand hygiene solution (gel or rub)  Discard old toothbrushes and razors when the treatment starts. Also discard opened deodorant rollers, skin adhesive tapes, skin creams and solutions- all of these may already be contaminated with staph  Discard pumice stone(s), sponges and disposable face cloths if used   Discard all make-up brushes, creams, and implements  Discard or hot wash all fluffy toys  Wash hair brush and comb, nail files, plastic toys and cutters in the dishwasher or purchase new ones  Remove nail varnish and artificial nails  Daily routine for 5 days     *Minimize contact with members of the community during the 5 days of treatment as much as possible* Body washes The effectiveness of the program increases if the correct procedure is used: Apply the provided antiseptic body wash (2% chlorhexidine) in the shower daily  Take care to wash hair, under the arms and   into the groin and into any folds of skin  Allow the antiseptic to remain on the skin for at least 5 minutes  Nasal ointment Disinfect your hands with alcohol gel/rub and allow to dry   Open the mupirocin 2% (Bactroban) nasal ointment.  Place small amount (size of match head) of ointment onto a clean cotton swab and massage gently around the inside of the nostril on one side, making sure not to insert it too deeply (no more than 2 cm or a little less than an inch).  Use a new cotton bud for the other nostril so that you do not contaminate the Bactroban tube with staph.   After applying the ointment, press a finger against the nose next to the nostril opening and use a circular  motion to spread the ointment within the nose  Apply the mupirocin ointment two times a day for 5 days  Disinfect your hands with alcohol rub/gel after applying the ointmentp>  Personal items (combs, razor, eyeglasses, jewelry, etc.)     Disinfect all personal items daily with an alcohol-based cleanser  House Environment and Clothes/Linens On day 2 and 5 of the treatment, clean your house, (especially the bedroom and bathroom). Clean dust off all surfaces and then vacuum clean all floor surfaces AND soft furnishings (such as your favorite chair). If your chair/couch has a vinyl or leather covering then wipe over the chair with warm soapy water and then dry with a clean towel (which should then be washed). Staph lives in skin scales from humans that contaminate the environment. This can lead to re-infection.   Disinfect the shower floor and/or bath tub daily   On days 1, 3, AND 5 of the treatment wash your clothes, underwear, pajamas and bed linen (such as towels, sheets, washcloths, and bath mats). A hot wash with laundry detergent is best (there is no need to use expensive laundry detergent or powder). Dry clothes in sun if possible. Change into clean clothes or pajamas on those days after your shower.   Do not share or exchange any personal items of clothing  Sports/Gym     Surfaces, equipment and towels, and skin-to-skin contact are all potential sources for staph re-infection Pets Dogs and other companion animals can also be colonized with the same strains of MRSA. Best to wash or replace bedding material for the animal and wash the animal at least once during the treatment period with antiseptic solution (2% chlorhexidine wash). Ensure that the skin of the animal is kept in good condition. If the pet has any chronic skin disorders, consult your vet prior to starting your treatment process. What about my partner, family, or household members? Usually when an aggressive strain of staph moves in to  a family or household, only certain members of that group get infections (boils). This is despite the fact that the strain has probably transferred itself from person to person within the group. Those without boils may also be carrying the bacterium, however they must have better resistance (immunity) or perhaps have better skin condition. Staph likes to invade through cuts, scratches and skin with dermatitis or dryness.    Follow-up after decolonization treatment  Possible approaches will vary depending on how your treatment goes. Your provider will instruct you on the follow-up best suited for you. Some options are:  Wait and see - if no further boils occur within 6 months then it is probable that the strain of staph has been eliminated from you.     Continue intermittent body washes 1-2 times per week with 2% aqueous chlorhexidine soap preparation or similar   Future antibiotic use  The best preventative approach to avoiding future problems may be to avoid use of antibiotics unless there is a strong indication. Antibiotics are often prescribed for minor infections or for respiratory infections that are mostly due to viruses. It is in your best interest to ride these infections out rather than taking antibiotics. Taking antibiotics alters your natural bacterial flora on your skin and in your gut. This may reduce your resistance against acquiring a resistant bacterial strain such as MRSA).   Important note: if you do become very ill with possible infection and require hospital review, it is important for you to remind your medical care providers that you have been colonized with MRSA in the past as they may have to use antibiotics that are active against the strain that you had previously.   References Wiese-Posselt et al. Clin Inf Dis 2007:44:e88  CDC:  http://www.cdc.gov/ncidod/dhqp/ar_mrsa_ca.html   Bleach baths  Take a bleach baths twice a week for at least 15 minutes with any kind of soap for 3  weeks. Bleach baths can be a good treatment for people who do not have broken skin or eczema. They can help prevent MRSA from coming back. Use lotion after the bath, because a bleach bath can dry out the skin.    How much bleach to put in: an average full bathtub (adult level) holds about 40 gallons of water, add 1/4 cup bleach for each 1/4 fill of the bathtub.    

## 2022-06-30 ENCOUNTER — Ambulatory Visit (HOSPITAL_COMMUNITY)
Admission: RE | Admit: 2022-06-30 | Discharge: 2022-06-30 | Disposition: A | Discharge status: Home / Self Care | Payer: Medicare Other | Source: Ambulatory Visit | Attending: Cardiovascular Disease | Admitting: Cardiovascular Disease

## 2022-06-30 DIAGNOSIS — I10 Essential (primary) hypertension: Secondary | ICD-10-CM | POA: Diagnosis not present

## 2022-06-30 DIAGNOSIS — R0789 Other chest pain: Secondary | ICD-10-CM | POA: Insufficient documentation

## 2022-06-30 DIAGNOSIS — E782 Mixed hyperlipidemia: Secondary | ICD-10-CM | POA: Insufficient documentation

## 2022-06-30 DIAGNOSIS — R079 Chest pain, unspecified: Secondary | ICD-10-CM | POA: Diagnosis not present

## 2022-06-30 DIAGNOSIS — I7121 Aneurysm of the ascending aorta, without rupture: Secondary | ICD-10-CM | POA: Insufficient documentation

## 2022-06-30 DIAGNOSIS — J9811 Atelectasis: Secondary | ICD-10-CM | POA: Diagnosis not present

## 2022-06-30 MED ORDER — IOHEXOL 350 MG/ML SOLN
100.0000 mL | Freq: Once | INTRAVENOUS | Status: AC | PRN
  Administered 2022-06-30: 100 mL via INTRAVENOUS

## 2022-07-04 ENCOUNTER — Encounter: Payer: Self-pay | Admitting: Internal Medicine

## 2022-07-04 ENCOUNTER — Ambulatory Visit: Payer: Medicare Other | Admitting: Internal Medicine

## 2022-07-04 VITALS — BP 110/68 | HR 91 | Ht 74.0 in | Wt 262.2 lb

## 2022-07-04 DIAGNOSIS — E1159 Type 2 diabetes mellitus with other circulatory complications: Secondary | ICD-10-CM | POA: Diagnosis not present

## 2022-07-04 DIAGNOSIS — E782 Mixed hyperlipidemia: Secondary | ICD-10-CM | POA: Diagnosis not present

## 2022-07-04 DIAGNOSIS — E1165 Type 2 diabetes mellitus with hyperglycemia: Secondary | ICD-10-CM

## 2022-07-04 MED ORDER — FREESTYLE LIBRE 3 SENSOR MISC: 1.0000 | 3 refills | Status: DC

## 2022-07-04 MED ORDER — INSULIN PEN NEEDLE 32G X 4 MM MISC: 3 refills | Status: DC

## 2022-07-04 MED ORDER — LANTUS SOLOSTAR 100 UNIT/ML ~~LOC~~ SOPN: 15.0000 [IU] | PEN_INJECTOR | Freq: Every day | SUBCUTANEOUS | 99 refills | Status: DC

## 2022-07-04 MED ORDER — SEMAGLUTIDE(0.25 OR 0.5MG/DOS) 2 MG/3ML ~~LOC~~ SOPN: 0.5000 mg | PEN_INJECTOR | SUBCUTANEOUS | 3 refills | Status: DC

## 2022-07-04 NOTE — Progress Notes (Signed)
Patient ID: NATHEN BALABAN, male   DOB: Mar 23, 1966, 57 y.o.   MRN: [id number]  HPI: ANGELINA NEECE is a 57 y.o.-year-old male, returning for follow-up for DM2, dx in 2020, non-insulin-dependent, uncontrolled, with complications (aortic atherosclerosis, history of TIA, AAA, CKD). Pt. previously saw Dr. Loanne Drilling, last visit 1 year ago.  Reviewed HbA1c: Lab Results  Component Value Date   HGBA1C 10.4 (A) 06/01/2022   HGBA1C 8.9 (A) 02/22/2022   HGBA1C 9.6 (A) 07/19/2021   HGBA1C 9.2 (A) 04/18/2021   HGBA1C 9.5 (H) 01/28/2021   HGBA1C 9.6 (A) 10/27/2020   HGBA1C 7.9 (H) 04/14/2020   HGBA1C 7.8 (A) 01/06/2020   HGBA1C 7.9 (H) 10/06/2019   Pt is on a regimen of: - Metformin ER 500 mg 4x a day - Glimepiride 2 mg in a.m. and before dinner - Ozempic 1 mg weekly - not in last 2 mo 2/2 doughnut hole (2 mg weekly caused nausea, could not eat meals, only ate snack). He was on Jardiance for 1 mo >> stopped 2/2 coverage.  Pt checks his sugars 1-2x a week: - am: 155-170 >> now 189-212 - 2h after b'fast: n/c - before lunch: n/c - 2h after lunch: 170-200s - before dinner: n/c - 2h after dinner: n/c - bedtime: n/c - nighttime: n/c Lowest sugar was 117; ? hypoglycemia awareness.  Highest sugar was 300s - steroid inj in back in 2023. He was on insulin briefly then.  Glucometer: One Touch  Pt's meals are: - Breakfast (2:30-3 am): apple + banana + sunflower seeds, nuts, celery and carrots - Lunch: wraps, keto bread sandwich - in past mo - Dinner: meat, starch, veggies; chips - Snacks: trailmix  - + CKD, last BUN/creatinine:  Lab Results  Component Value Date   BUN 17 06/21/2022   BUN 26 (H) 06/01/2022   CREATININE 1.18 06/21/2022   CREATININE 1.33 (H) 06/01/2022  He is on Lisinopril 30 mg daily.  -+ HL; last set of lipids: Lab Results  Component Value Date   CHOL 150 12/21/2021   HDL 59 12/21/2021   LDLCALC 71 12/21/2021   LDLDIRECT 145 (H) 01/06/2020   TRIG 113 12/21/2021   CHOLHDL  2.5 12/21/2021  He Is on Zetia and Repatha.  - last eye exam was in 04/2022. No DR. DR. Katy Fitch. + cataract.  - no numbness and tingling in his feet.  Last foot exam 06/12/2022.  He also has a history of back pain-disabled due to this, prostate cancer, HTN. He had reconstructive surgery on both shoulders.   ROS: + see HPI + increased urination, + blurry vision, no nausea, no chest pain. He has insomnia.  Past Medical History:  Diagnosis Date   AAA (abdominal aortic aneurysm) (HCC)    Anxiety    Bradycardia    Bradycardia    Difficulty sleeping    Essential hypertension    Family history of stroke    Headache    History of transient ischemic attack (TIA)    15 YRS AGO   Hyperlipidemia    Impotence of organic origin    Liver cyst    Memory loss    Osteoarthritis of acromioclavicular joint    Prostate cancer (HCC)    PVC (premature ventricular contraction)    Type 2 diabetes mellitus with hyperglycemia ([place])    Past Surgical History:  Procedure Laterality Date   HERNIA REPAIR     X2 ( 1 ING HERNIA / 1 UMBILICAL HERNIA )   LYMPHADENECTOMY Bilateral 01/04/2015  Procedure: BILATERAL LYMPHADENECTOMY;  Surgeon: Raynelle Bring, MD;  Location: [facility];  Service: Urology;  Laterality: Bilateral;   MASS EXCISION     L UPPER ARM   PENILE PROSTHESIS IMPLANT  03/18/2019   ROBOT ASSISTED LAPAROSCOPIC RADICAL PROSTATECTOMY N/A 01/04/2015   Procedure: ROBOTIC ASSISTED LAPAROSCOPIC RADICAL PROSTATECTOMY LEVEL 2;  Surgeon: Raynelle Bring, MD;  Location: [facility];  Service: Urology;  Laterality: N/A;   ROTATOR CUFF REPAIR Bilateral    SURGERY SCROTAL / TESTICULAR     Social History   Socioeconomic History   Marital status: Married    Spouse name: Not on file   Number of children: 5   Years of education: Not on file   Highest education level: Associate degree: occupational, Hotel manager, or vocational program  Occupational History   Occupation: part time  Tobacco Use   Smoking status: Never    Smokeless tobacco: Never  Vaping Use   Vaping Use: Never used  Substance and Sexual Activity   Alcohol use: Yes    Alcohol/week: 0.0 standard drinks of alcohol    Comment: OCCASIONAL   Drug use: No   Sexual activity: Yes    Partners: Female  Other Topics Concern   Not on file  Social History Narrative   Not on file   Social Determinants of Health   Financial Resource Strain: Low Risk  (12/01/2021)   Overall Financial Resource Strain (CARDIA)    Difficulty of Paying Living Expenses: Not very hard  Food Insecurity: No Food Insecurity (12/01/2021)   Hunger Vital Sign    Worried About Running Out of Food in the Last Year: Never true    Ran Out of Food in the Last Year: Never true  Transportation Needs: No Transportation Needs (12/01/2021)   PRAPARE - Hydrologist (Medical): No    Lack of Transportation (Non-Medical): No  Physical Activity: Insufficiently Active (12/01/2021)   Exercise Vital Sign    Days of Exercise per Week: 3 days    Minutes of Exercise per Session: 20 min  Stress: Stress Concern Present (12/01/2021)   [place]    Feeling of Stress : To some extent  Social Connections: Moderately Integrated (12/01/2021)   Social Connection and Isolation Panel [NHANES]    Frequency of Communication with Friends and Family: Once a week    Frequency of Social Gatherings with Friends and Family: Once a week    Attends Religious Services: More than 4 times per year    Active Member of [employer] or Organizations: Yes    Attends Music therapist: More than 4 times per year    Marital Status: Married  Human resources officer Violence: Not At Risk (12/01/2021)   Humiliation, Afraid, Rape, and Kick questionnaire    Fear of Current or Ex-Partner: No    Emotionally Abused: No    Physically Abused: No    Sexually Abused: No   Current Outpatient Medications on File Prior to Visit  Medication Sig  Dispense Refill   APPLE CIDER VINEGAR PO Take by mouth.     BLACK CURRANT SEED OIL PO Take by mouth.     cetirizine (ZYRTEC) 10 MG tablet Take 1 tablet (10 mg total) by mouth daily. 30 tablet 11   chlorhexidine (HIBICLENS) 4 % external liquid Apply topically daily. For 5 days 473 mL 1   chlorthalidone (HYGROTON) 25 MG tablet TAKE 1 TABLET(25 MG) BY MOUTH DAILY 90 tablet 1   CINNAMON  PO Take by mouth.     Evolocumab with Infusor ([place]) 420 MG/3.5ML SOCT Inject 420 mg into the skin every 30 (thirty) days. 3.6 mL 11   ezetimibe (ZETIA) 10 MG tablet TAKE 1 TABLET(10 MG) BY MOUTH DAILY 90 tablet 1   fluticasone (FLONASE) 50 MCG/ACT nasal spray Place 2 sprays into both nostrils daily. 16 g 6   gabapentin (NEURONTIN) 300 MG capsule TAKE 1 CAPSULE(300 MG) BY MOUTH AT BEDTIME 30 capsule 3   Ginger, Zingiber officinalis, (GINGER PO) Take by mouth.     glimepiride (AMARYL) 2 MG tablet 2 tabs with breakfast and 1 tab with dinner 270 tablet 1   glucose blood (ONETOUCH VERIO) test strip 1 each by Other route daily. And lancets 1/day 100 each 3   ibuprofen (ADVIL) 600 MG tablet Take 1 tablet (600 mg total) by mouth every 8 (eight) hours as needed (pain). 15 tablet 0   latanoprost (XALATAN) 0.005 % ophthalmic solution Place 1 drop into both eyes at bedtime.     lisinopril (ZESTRIL) 30 MG tablet TAKE 1 TABLET(30 MG) BY MOUTH DAILY 90 tablet 1   metFORMIN (GLUCOPHAGE-XR) 500 MG 24 hr tablet TAKE 2 TABLETS(1000 MG) BY MOUTH IN THE MORNING AND AT BEDTIME 360 tablet 1   mupirocin ointment (BACTROBAN) 2 % Place 1 Application into the nose 2 (two) times daily. For 5 days 22 g 1   omeprazole (PRILOSEC) 40 MG capsule Take 1 capsule (40 mg total) by mouth in the morning. (Patient taking differently: Take 40 mg by mouth daily as needed.) 30 capsule 11   OVER THE COUNTER MEDICATION BEET SUPPLEMENT     TURMERIC PO Take by mouth.     No current facility-administered medications on file prior to visit.    Allergies  Allergen Reactions   Bee Venom Anaphylaxis   Dapagliflozin Pro-Metformin Er    Lipitor [Atorvastatin]     FATIGUED NAUSEA BRAIN FOG   Family History  Problem Relation Age of Onset   Anuerysm Mother    Stroke Mother    Hypertension Father    Peripheral vascular disease Father    Stroke Father    Colon cancer Father        40's   Diabetes Sister    Heart murmur Sister    Heart murmur Son    Stomach cancer Neg Hx    Rectal cancer Neg Hx    Throat cancer Neg Hx    PE: BP 110/68 (BP Location: Right Arm, Patient Position: Sitting, Cuff Size: Normal)   Pulse 91   Ht '6\' 2"'$  (1.88 m)   Wt 262 lb 3.2 oz (118.9 kg)   SpO2 98%   BMI 33.66 kg/m  Wt Readings from Last 3 Encounters:  07/04/22 262 lb 3.2 oz (118.9 kg)  06/28/22 261 lb 12.8 oz (118.8 kg)  06/21/22 260 lb (117.9 kg)   Constitutional: overweight, in NAD Eyes:  EOMI, no exophthalmos ENT: no neck masses, no cervical lymphadenopathy Cardiovascular: RRR, No MRG Respiratory: CTA B Musculoskeletal: no deformities Skin:no rashes Neurological: no tremor with outstretched hands  ASSESSMENT: 1. DM2, non-insulin-dependent, uncontrolled, with complications - Aortic ATC - per CTA 06/30/2022 - AAA - TIA - CKD  2. HL  PLAN:  1. Patient with long-standing, uncontrolled diabetes, on oral antidiabetic regimen with metformin and sulfonylurea and also weekly GLP-1 receptor agonist, with still poor control.  Latest HbA1c obtained last month was higher, at 10.4%.  He tells me that he has been on  Ozempic for 2 months as he could not afford it in the donut hole.  However, this was reset at the beginning of the year. -Blood sugars are high in the morning, above target, and they are also high later in the day, after lunch, but he is not checking in the evening.  We discussed about the need to check more frequently to understand patterns.  I suggested a CGM and he would very much like to try this.  I sent a prescription for  the freestyle libre 3 CGM to his pharmacy.  I am hoping this is covered. -Since the HbA1c and his blood sugars are quite high, we discussed about adding basal insulin.  I advised him that this may not be a permanent measure, I am hoping to only be provisional, until we can get his blood sugars more under control, especially if we are able to restart Ozempic, which I recommended today.  Will start at a low dose of Ozempic and increase as tolerated.  We also discussed about basal insulin, when and how to inject and how to titrate the dose.  For now, we can continue metformin (we discussed that now that he is taking the extended release formulation, he can try to control the metformin doses together) and glimepiride but, at next visit, I would like to restart an SGLT2 inhibitor instead of glimepiride, if affordable. -We also discussed about the need to improve diet.  I made several suggestions, to include avoiding dried fruit, chips/ - I suggested to:  Patient Instructions  Please continue: - Metformin ER 500 mg 4x a day, with meals - Glimepiride 2 mg in a.m.  Restart: - Ozempic 0.25 mg weekly x 2 doses >> increase to 0.5 mg weekly  Also, start: - Lantus 12 units at bedtime, and increase by 4 units every 2-4 days until sugars in am are <130  Please return in 2-3 months with your sugar log.   - check sugars at different times of the day - check 4x a day, rotating checks - targets for treatment: 80-130 mg/dL before meals and <180 mg/dL after meals; target HbA1c <7%. - given foot care handout  - given instructions for hypoglycemia management "15-15 rule"  - advised for yearly eye exams  - Return to clinic in 2-3 months  2. HL - Reviewed latest lipid panel from 11/2021: LDL higher than our goal of less than 55 due to cardiovascular disease: Lab Results  Component Value Date   CHOL 150 12/21/2021   HDL 59 12/21/2021   LDLCALC 71 12/21/2021   LDLDIRECT 145 (H) 01/06/2020   TRIG 113 12/21/2021    CHOLHDL 2.5 12/21/2021  - Continues Zetia 10 mg daily and Repatha without side effects.  - Total time spent for the visit: 40 min, in precharting, reviewing Dr. Cordelia Pen last note, obtaining medical information from the chart and from the pt, reviewing his  previous labs, evaluations, and treatments, reviewing his symptoms, counseling him about his diabetes (please see the discussed topics above), and developing a plan to further treat it; he had a number of questions which I addressed.  Philemon Kingdom, MD PhD [facility] Endocrinology

## 2022-07-04 NOTE — Patient Instructions (Addendum)
Please continue: - Metformin ER 500 mg 4x a day, with meals - Glimepiride 2 mg in a.m.  Restart: - Ozempic 0.25 mg weekly x 2 doses >> increase to 0.5 mg weekly  Also, start: - Lantus 12 units at bedtime, and increase by 4 units every 2-4 days until sugars in am are <130  Please return in 2-3 months with your sugar log.   PATIENT INSTRUCTIONS FOR TYPE 2 DIABETES:  DIET AND EXERCISE Diet and exercise is an important part of diabetic treatment.  We recommended aerobic exercise in the form of brisk walking (working between 40-60% of maximal aerobic capacity, similar to brisk walking) for 150 minutes per week (such as 30 minutes five days per week) along with 3 times per week performing 'resistance' training (using various gauge rubber tubes with handles) 5-10 exercises involving the major muscle groups (upper body, lower body and core) performing 10-15 repetitions (or near fatigue) each exercise. Start at half the above goal but build slowly to reach the above goals. If limited by weight, joint pain, or disability, we recommend daily walking in a swimming pool with water up to waist to reduce pressure from joints while allow for adequate exercise.    BLOOD GLUCOSES Monitoring your blood glucoses is important for continued management of your diabetes. Please check your blood glucoses 2-4 times a day: fasting, before meals and at bedtime (you can rotate these measurements - e.g. one day check before the 3 meals, the next day check before 2 of the meals and before bedtime, etc.).   HYPOGLYCEMIA (low blood sugar) Hypoglycemia is usually a reaction to not eating, exercising, or taking too much insulin/ other diabetes drugs.  Symptoms include tremors, sweating, hunger, confusion, headache, etc. Treat IMMEDIATELY with 15 grams of Carbs: 4 glucose tablets  cup regular juice/soda 2 tablespoons raisins 4 teaspoons sugar 1 tablespoon honey Recheck blood glucose in 15 mins and repeat above if still  symptomatic/blood glucose <100.  RECOMMENDATIONS TO REDUCE YOUR RISK OF DIABETIC COMPLICATIONS: * Take your prescribed MEDICATION(S) * Follow a DIABETIC diet: Complex carbs, fiber rich foods, (monounsaturated and polyunsaturated) fats * AVOID saturated/trans fats, high fat foods, >2,300 mg salt per day. * EXERCISE at least 5 times a week for 30 minutes or preferably daily.  * DO NOT SMOKE OR DRINK more than 1 drink a day. * Check your FEET every day. Do not wear tightfitting shoes. Contact us if you develop an ulcer * See your EYE doctor once a year or more if needed * Get a FLU shot once a year * Get a PNEUMONIA vaccine once before and once after age 34 years  GOALS:  * Your Hemoglobin A1c of <7%  * fasting sugars need to be 80-130 * after meals sugars need to be <180 (2h after you start eating) * Your Systolic BP should be 629 or lower  * Your Diastolic BP should be 80 or lower  * Your HDL (Good Cholesterol) should be 40 or higher  * Your LDL (Bad Cholesterol) should be ideally <70. * Your Triglycerides should be 150 or lower  * Your Urine microalbumin (kidney function) should be <30 * Your Body Mass Index should be 25 or lower   Please consider the following ways to cut down carbs and fat and increase fiber and micronutrients in your diet: - substitute whole grain for white bread or pasta - substitute brown rice for white rice - substitute 90-calorie flat bread pieces for slices of bread when possible -  substitute sweet potatoes or yams for white potatoes - substitute humus for margarine - substitute tofu for cheese when possible - substitute almond or rice milk for regular milk (would not drink soy milk daily due to concern for soy estrogen influence on breast cancer risk) - substitute dark chocolate for other sweets when possible - substitute water - can add lemon or orange slices for taste - for diet sodas (artificial sweeteners will trick your body that you can eat sweets  without getting calories and will lead you to overeating and weight gain in the long run) - do not skip breakfast or other meals (this will slow down the metabolism and will result in more weight gain over time)  - can try smoothies made from fruit and almond/rice milk in am instead of regular breakfast - can also try old-fashioned (not instant) oatmeal made with almond/rice milk in am - order the dressing on the side when eating salad at a restaurant (pour less than half of the dressing on the salad) - eat as little meat as possible - can try juicing, but should not forget that juicing will get rid of the fiber, so would alternate with eating raw veg./fruits or drinking smoothies - use as little oil as possible, even when using olive oil - can dress a salad with a mix of balsamic vinegar and lemon juice, for e.g. - use agave nectar, stevia sugar, or regular sugar rather than artificial sweateners - steam or broil/roast veggies  - snack on veggies/fruit/nuts (unsalted, preferably) when possible, rather than processed foods - reduce or eliminate aspartame in diet (it is in diet sodas, chewing gum, etc) Read the labels!  Try to read Dr. Janene Harvey book: "Program for Reversing Diabetes" for other ideas for healthy eating.

## 2022-07-05 ENCOUNTER — Ambulatory Visit: Payer: Medicare Other | Admitting: Internal Medicine

## 2022-07-17 ENCOUNTER — Telehealth: Payer: Self-pay | Admitting: Internal Medicine

## 2022-07-17 NOTE — Telephone Encounter (Signed)
Pt called back and he thought he was supposed to be taking 3 units of Lantus. Pt has been taking his Metformin as prescribed. Pt also now has [place] 3. Connected with the clinic. Pt was advised per provider instructions to take 12 units of Lantus daily. Increase 3-4 units every 2-4 days until blood sugar is >130 in the morning. Pt requested a referral for the dietician/ educator. Advised to contact the office if sugars remain uncontrolled.

## 2022-07-17 NOTE — Telephone Encounter (Signed)
Called and lvm for pt to call back. 

## 2022-07-17 NOTE — Telephone Encounter (Signed)
Patient is calling to say that he is having trouble with his blood sugar level and at present time he is feeling lightheaded and dizzy.  07/16/22   930 AM blood sugar level went from 123 to 210 after eating 2 boiled eggs.  07/16/22  Blood sugar stayed up until 7:00 PM and went down to 167 after eating grilled chicken  07/17/22  Blood sugar went from 123 to 262 after a pear and 2 slim jims.   It did go back down to 104.   07/17/2022 at 9:30 AM he had stomach upset so he took [employer] and his blood sugar went to 262.  07/17/22  At 12:00 PM he was feeling dizzy and his blood sugar was 102. 07/17/22  When he called he blood sugar was 104.

## 2022-07-18 ENCOUNTER — Other Ambulatory Visit: Payer: Self-pay | Admitting: Internal Medicine

## 2022-07-18 ENCOUNTER — Encounter: Payer: Self-pay | Admitting: Internal Medicine

## 2022-07-18 DIAGNOSIS — E1165 Type 2 diabetes mellitus with hyperglycemia: Secondary | ICD-10-CM

## 2022-07-18 NOTE — Telephone Encounter (Signed)
I sent him a msg in [place] after reviewing his CGM downloads.

## 2022-07-18 NOTE — Telephone Encounter (Signed)
Pt wants to know about Synjardy and if he should be on that medication. Pt also wants to get his liver function checked.

## 2022-07-18 NOTE — Telephone Encounter (Signed)
Pt advised via Mychart message. 

## 2022-07-18 NOTE — Telephone Encounter (Signed)
Liver tests per PCP. Regarding Synjardy, I see that he is already on metformin and was on Jardiance but this stopped being covered.  He can check with his insurance and see if Iva Boop is covered, and if it is, we can start it.  However, I would prefer to use Jardiance and metformin separately instead, rather than a combination pill, since ideally they are administered at different times of the day.

## 2022-07-28 ENCOUNTER — Ambulatory Visit: Payer: Medicare Other | Admitting: Dietician

## 2022-07-28 ENCOUNTER — Other Ambulatory Visit: Payer: Self-pay | Admitting: Internal Medicine

## 2022-07-28 MED ORDER — EMPAGLIFLOZIN 25 MG PO TABS: 25.0000 mg | ORAL_TABLET | Freq: Every day | ORAL | 1 refills | Status: DC

## 2022-07-31 ENCOUNTER — Encounter: Payer: Medicare Other | Attending: Internal Medicine | Admitting: Dietician

## 2022-07-31 ENCOUNTER — Encounter: Payer: Self-pay | Admitting: Dietician

## 2022-07-31 VITALS — Ht 73.0 in | Wt 258.0 lb

## 2022-07-31 DIAGNOSIS — E1165 Type 2 diabetes mellitus with hyperglycemia: Secondary | ICD-10-CM | POA: Insufficient documentation

## 2022-07-31 NOTE — Progress Notes (Signed)
Diabetes Self-Management Education  Visit Type: First/Initial  Appt. Start Time: 1105 Appt. End Time: 1207  07/31/2022  [name], identified by name and date of birth, is a 57 y.o. male with a diagnosis of Diabetes: Type 2.   ASSESSMENT  Primary concern: Pt states he is feeling burnout about his diabetes.   History includes: anxiety, arthritis, cancer, type 2 diabetes, HLD, HTN.  Labs noted: 06/01/22 A1c 10.4% Medications include: jardiance, glimepiride, insulin glargine, metformin (taking), semaglutide (taking) Supplements: not assessed  CGM: Freestyle Libre Time in range: x14 days 92%, x30 days 87%  Pt states he is worried and burnt out because his numbers have been going higher.   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   Pt states he is currently taking metformin and semaglutide but is planning to switch metformin to jardiance.   Pt has been doing 15-20 minutes of walking and calisthenics over the last 3 weeks and has found that it has improved his blood glucose. [facility]   Pt is interested in including more plant based proteins in his diet such as tofu.   Height '6\' 1"'$  (1.854 m), weight 258 lb (117 kg). Body mass index is 34.04 kg/m.   Diabetes Self-Management Education - 07/31/22 1101       Visit Information   Visit Type First/Initial      Initial Visit   Diabetes Type Type 2    Date Diagnosed 2020    Are you currently following a meal plan? No    Are you taking your medications as prescribed? Yes      Health Coping   How would you rate your overall health? Fair      Psychosocial Assessment   Patient Belief/Attitude about Diabetes Defeat/Burnout    What is the hardest part about your diabetes right now, causing you the most concern, or is the most worrisome to you about your diabetes?   Making healty food and beverage choices    Self-care barriers  None    Self-management support Doctor's office    Other persons present Patient    Patient Concerns Nutrition/Meal planning    Special Needs None    Preferred Learning Style No preference indicated    Learning Readiness Ready    What is the last grade level you completed in school? some college      Pre-Education Assessment   Patient understands the diabetes disease and treatment process. Needs Instruction    Patient understands incorporating nutritional management into lifestyle. Needs Instruction    Patient undertands incorporating physical activity into lifestyle. Needs Instruction    Patient understands using medications safely. Needs Instruction    Patient understands monitoring blood glucose, interpreting and using results Needs Instruction    Patient understands prevention, detection, and treatment of acute complications. Needs Instruction    Patient understands prevention, detection, and treatment of chronic complications. Needs Instruction    Patient understands how to develop strategies to address psychosocial issues. Needs Instruction    Patient understands how to develop strategies to promote health/change behavior. Needs Instruction      Complications   Last HgB A1C per patient/outside source 10.4 %    How often do you check your blood sugar? > 4 times/day    Fasting Blood glucose range (mg/dL) 130-179      Dietary Intake   Breakfast 3:30am: 2  pieces deli meat    Snack (morning) pear, celery, carrots, slim jim    Lunch 8am: eggs and [place] sausage    Snack (afternoon) pineapple OR smoothie with protein, blueberries, banana, almond milk    Dinner 7pm: salmon or chicken with steamed veggies and sometimes brown rice    Snack (evening) none    Beverage(s) 2L water      Activity / Exercise   Activity / Exercise Type Light (walking / raking leaves)    How many days per week do you exercise? 5    How many minutes per day do you exercise? 15    Total minutes per week of  exercise 75      Patient Education   Previous Diabetes Education No    Disease Pathophysiology Definition of diabetes, type 1 and 2, and the diagnosis of diabetes;Factors that contribute to the development of diabetes;Explored patient's options for treatment of their diabetes    Healthy Eating Role of diet in the treatment of diabetes and the relationship between the three main macronutrients and blood glucose level;Food label reading, portion sizes and measuring food.;Carbohydrate counting;Reviewed blood glucose goals for pre and post meals and how to evaluate the patients' food intake on their blood glucose level.;Meal timing in regards to the patients' current diabetes medication.;Meal options for control of blood glucose level and chronic complications.    Being Active Role of exercise on diabetes management, blood pressure control and cardiac health.;Helped patient identify appropriate exercises in relation to his/her diabetes, diabetes complications and other health issue.    Medications Reviewed patients medication for diabetes, action, purpose, timing of dose and side effects.    Monitoring Identified appropriate SMBG and/or A1C goals.;Daily foot exams;Yearly dilated eye exam    Acute complications Discussed and identified patients' prevention, symptoms, and treatment of hyperglycemia.;Educated on sick day management    Chronic complications Relationship between chronic complications and blood glucose control;Identified and discussed with patient  current chronic complications;Lipid levels, blood glucose control and heart disease;Reviewed with patient heart disease, higher risk of, and prevention    Diabetes Stress and Support Identified and addressed patients feelings and concerns about diabetes;Worked with patient to identify barriers to care and solutions;Role of stress on diabetes    Lifestyle and Health Coping Lifestyle issues that need to be addressed for better diabetes care       Individualized Goals (developed by patient)   Nutrition General guidelines for healthy choices and portions discussed    Physical Activity Exercise 3-5 times per week;30 minutes per day    Medications take my medication as prescribed    Monitoring  Test my blood glucose as discussed    Problem Solving Eating Pattern    Reducing Risk examine blood glucose patterns;do foot checks daily;treat hypoglycemia with 15 grams of carbs if blood glucose less than '70mg'$ /dL    Health Coping Ask for help with psychological, social, or emotional issues      Post-Education Assessment   Patient understands the diabetes disease and treatment process. Comprehends key points    Patient understands incorporating nutritional management into lifestyle. Comprehends key points    Patient undertands incorporating physical activity into lifestyle. Comprehends key points    Patient understands using medications safely. Comphrehends key points    Patient understands monitoring blood glucose, interpreting and using results Comprehends key points    Patient understands prevention, detection, and treatment of acute complications. Comprehends key points    Patient understands prevention, detection, and treatment of chronic complications.  Comprehends key points    Patient understands how to develop strategies to address psychosocial issues. Comprehends key points    Patient understands how to develop strategies to promote health/change behavior. Comprehends key points      Outcomes   Expected Outcomes Demonstrated interest in learning. Expect positive outcomes    Future DMSE 2 months    Program Status Not Completed             Individualized Plan for Diabetes Self-Management Training:   Learning Objective:  Patient will have a greater understanding of diabetes self-management. Patient education plan is to attend individual and/or group sessions per assessed needs and concerns.   Plan:   Patient Instructions  Aim  for 150 minutes of physical activity weekly. Make physical activity a part of your week. Try to include at least 30 minutes of physical activity 5 days each week or at least 150 minutes per week. Regular physical activity promotes overall health-including helping to reduce risk for heart disease and diabetes, promoting mental health, and helping [place] sleep better.     Goal: exercise for 30 minutes 5 days per week.   Goal: aim to make 1/2 of your plate vegetables at least 2x/day  Make simple meals at home more often than eating out.  When snacking include a carb and protein.   At meals always include 1/4 plate protein, 1/4 plate complex carbs, and 1/2 plate vegetables.    Expected Outcomes:  Demonstrated interest in learning. Expect positive outcomes  Education material provided: ADA - How to Thrive: A Guide for Your Journey with Diabetes and Snack sheet, Meal Ideas  If problems or questions, patient to contact team via:  Phone  Future DSME appointment: 2 months

## 2022-07-31 NOTE — Patient Instructions (Addendum)
Aim for 150 minutes of physical activity weekly. Make physical activity a part of your week. Try to include at least 30 minutes of physical activity 5 days each week or at least 150 minutes per week. Regular physical activity promotes overall health-including helping to reduce risk for heart disease and diabetes, promoting mental health, and helping [place] sleep better.     Goal: exercise for 30 minutes 5 days per week.   Goal: aim to make 1/2 of your plate vegetables at least 2x/day  Make simple meals at home more often than eating out.  When snacking include a carb and protein.   At meals always include 1/4 plate protein, 1/4 plate complex carbs, and 1/2 plate vegetables.

## 2022-08-11 ENCOUNTER — Ambulatory Visit
Admission: EM | Admit: 2022-08-11 | Discharge: 2022-08-11 | Disposition: A | Discharge status: Home / Self Care | Payer: Medicare Other | Attending: Physician Assistant | Admitting: Physician Assistant

## 2022-08-11 DIAGNOSIS — R1012 Left upper quadrant pain: Secondary | ICD-10-CM | POA: Diagnosis not present

## 2022-08-11 MED ORDER — OMEPRAZOLE 40 MG PO CPDR: 40.0000 mg | DELAYED_RELEASE_CAPSULE | Freq: Every morning | ORAL | 1 refills | Status: AC

## 2022-08-11 NOTE — ED Provider Notes (Signed)
[facility]    CSN: [id number] Arrival date & time: 08/11/22  1712      History   Chief Complaint Chief Complaint  Patient presents with   Abdominal Pain    HPI Jose Bishop is a 57 y.o. male.   Patient here today for evaluation of left-sided upper abdominal pain that started about 2 weeks ago.  He reports that symptoms are intermittent but seem to be becoming more frequent.  He describes the pain as a spasm-like pain.  He does report some associated nausea but denies any diarrhea or vomiting.  He has not had any blood in the stool.  He does state he did have some dark stools for a day or so after taking Pepto-Bismol but this returned to a normal color.  He has not had any shortness of breath.  He denies any worsening with activity.  He cannot relate pain to eating.  The history is provided by the patient.  Abdominal Pain Associated symptoms: nausea   Associated symptoms: no chest pain, no chills, no fever, no shortness of breath and no vomiting     Past Medical History:  Diagnosis Date   AAA (abdominal aortic aneurysm) (HCC)    Anxiety    Bradycardia    Bradycardia    Difficulty sleeping    Essential hypertension    Family history of stroke    Headache    History of transient ischemic attack (TIA)    15 YRS AGO   Hyperlipidemia    Impotence of organic origin    Liver cyst    Memory loss    Osteoarthritis of acromioclavicular joint    Prostate cancer ([place])    PVC (premature ventricular contraction)    Type 2 diabetes mellitus with hyperglycemia ([place])     Patient Active Problem List   Diagnosis Date Noted   Statin myopathy 06/12/2022   Sciatic nerve pain, left 05/17/2022   Recurrent infection of skin 05/17/2022   Moderate episode of recurrent major depressive disorder ([place]) 03/08/2022   GAD (generalized anxiety disorder) 03/08/2022   Acute non-recurrent maxillary sinusitis 05/04/2021   Elevated blood pressure reading with diagnosis of  hypertension 10/28/2020   Low back pain 09/27/2020   Neck pain 09/27/2020   DDD (degenerative disc disease), cervical 04/17/2020   Degeneration of lumbar intervertebral disc 04/17/2020   Type 2 diabetes mellitus with hyperglycemia, with long-term current use of insulin ([place]) 10/06/2019   Benign essential HTN 10/06/2019   Thoracic aortic aneurysm ([place]) 09/16/2019   ED (erectile dysfunction) of organic origin 01/06/2019   History of repair of rotator cuff 04/16/2018   Hyperlipidemia 04/18/2017   Atypical chest pain 01/24/2017   Prostate cancer ([place]) 01/04/2015   History of recurrent TIAs 10/02/2014   Family history of cerebral aneurysm 10/02/2014    Past Surgical History:  Procedure Laterality Date   HERNIA REPAIR     X2 ( 1 ING HERNIA / 1 UMBILICAL HERNIA )   LYMPHADENECTOMY Bilateral 01/04/2015   Procedure: BILATERAL LYMPHADENECTOMY;  Surgeon: Raynelle Bring, MD;  Location: [facility];  Service: Urology;  Laterality: Bilateral;   MASS EXCISION     L UPPER ARM   PENILE PROSTHESIS IMPLANT  03/18/2019   ROBOT ASSISTED LAPAROSCOPIC RADICAL PROSTATECTOMY N/A 01/04/2015   Procedure: ROBOTIC ASSISTED LAPAROSCOPIC RADICAL PROSTATECTOMY LEVEL 2;  Surgeon: Raynelle Bring, MD;  Location: [facility];  Service: Urology;  Laterality: N/A;   ROTATOR CUFF REPAIR Bilateral    SURGERY SCROTAL / TESTICULAR  Home Medications    Prior to Admission medications   Medication Sig Start Date End Date Taking? Authorizing Provider  APPLE CIDER VINEGAR PO Take by mouth.   Yes [provider]  BLACK CURRANT SEED OIL PO Take by mouth.   Yes [provider]  cetirizine (ZYRTEC) 10 MG tablet Take 1 tablet (10 mg total) by mouth daily. 07/12/21  Yes Mayers, Cari S, PA-C  chlorthalidone (HYGROTON) 25 MG tablet TAKE 1 TABLET(25 MG) BY MOUTH DAILY 06/01/22  Yes McClung, Angela M, PA-C  CINNAMON PO Take by mouth.   Yes [provider]  Continuous Blood Gluc Sensor (FREESTYLE LIBRE 3 SENSOR)  MISC 1 each by Does not apply route every 14 (fourteen) days. 07/04/22  Yes Philemon Kingdom, MD  empagliflozin (JARDIANCE) 25 MG TABS tablet Take 1 tablet (25 mg total) by mouth daily before breakfast. 07/28/22  Yes Philemon Kingdom, MD  Evolocumab with Infusor ([place]) 420 MG/3.5ML SOCT Inject 420 mg into the skin every 30 (thirty) days. 10/10/21  Yes Monge, Helane Gunther, NP  ezetimibe (ZETIA) 10 MG tablet TAKE 1 TABLET(10 MG) BY MOUTH DAILY 06/01/22  Yes McClung, Angela M, PA-C  gabapentin (NEURONTIN) 300 MG capsule TAKE 1 CAPSULE(300 MG) BY MOUTH AT BEDTIME 06/01/22  Yes McClung, Angela M, PA-C  Ginger, Zingiber officinalis, (GINGER PO) Take by mouth.   Yes [provider]  glimepiride (AMARYL) 2 MG tablet 2 tabs with breakfast and 1 tab with dinner 06/01/22  Yes McClung, Angela M, PA-C  glucose blood (ONETOUCH VERIO) test strip 1 each by Other route daily. And lancets 1/day 07/19/21  Yes Renato Shin, MD  ibuprofen (ADVIL) 600 MG tablet Take 1 tablet (600 mg total) by mouth every 8 (eight) hours as needed (pain). 06/22/22  Yes Banister, Gwenlyn Perking, MD  latanoprost (XALATAN) 0.005 % ophthalmic solution Place 1 drop into both eyes at bedtime.   Yes [provider]  lisinopril (ZESTRIL) 30 MG tablet TAKE 1 TABLET(30 MG) BY MOUTH DAILY 06/01/22  Yes McClung, Angela M, PA-C  metFORMIN (GLUCOPHAGE-XR) 500 MG 24 hr tablet TAKE 2 TABLETS(1000 MG) BY MOUTH IN THE MORNING AND AT BEDTIME 06/01/22  Yes McClung, Angela M, PA-C  mupirocin ointment (BACTROBAN) 2 % Place 1 Application into the nose 2 (two) times daily. For 5 days 06/28/22  Yes Comer, Okey Regal, MD  [place]   Yes [provider]  Semaglutide,0.25 or 0.5MG/DOS, 2 MG/3ML SOPN Inject 0.5 mg into the skin once a week. 07/04/22  Yes Philemon Kingdom, MD  TURMERIC PO Take by mouth.   Yes [provider]  chlorhexidine (HIBICLENS) 4 % external liquid Apply topically daily. For 5  days 06/28/22   Thayer Headings, MD  fluticasone [facility]) 50 MCG/ACT nasal spray Place 2 sprays into both nostrils daily. 04/19/21   Fenton Foy, NP  insulin glargine (LANTUS SOLOSTAR) 100 UNIT/ML Solostar Pen Inject 15 Units into the skin at bedtime. 07/04/22   Philemon Kingdom, MD  Insulin Pen Needle 32G X 4 MM MISC Use 1x a day 07/04/22   Philemon Kingdom, MD  omeprazole (PRILOSEC) 40 MG capsule Take 1 capsule (40 mg total) by mouth in the morning. 08/11/22   Francene Finders, PA-C    Family History Family History  Problem Relation Age of Onset   Anuerysm Mother    Stroke Mother    Hypertension Father    Peripheral vascular disease Father    Stroke Father  Colon cancer Father        44's   Diabetes Sister    Heart murmur Sister    Heart murmur Son    Stomach cancer Neg Hx    Rectal cancer Neg Hx    Throat cancer Neg Hx     Social History Social History   Tobacco Use   Smoking status: Never   Smokeless tobacco: Never  Vaping Use   Vaping Use: Never used  Substance Use Topics   Alcohol use: Yes    Alcohol/week: 0.0 standard drinks of alcohol    Comment: OCCASIONAL   Drug use: No     Allergies   Bee venom, Dapagliflozin pro-metformin er, and Lipitor [atorvastatin]   Review of Systems Review of Systems  Constitutional:  Negative for chills and fever.  Eyes:  Negative for discharge and redness.  Respiratory:  Negative for shortness of breath.   Cardiovascular:  Negative for chest pain.  Gastrointestinal:  Positive for abdominal pain and nausea. Negative for vomiting.  Neurological:  Negative for numbness.     Physical Exam Triage Vital Signs ED Triage Vitals  Enc Vitals Group     BP 08/11/22 1812 112/72     Pulse Rate 08/11/22 1812 (!) 56     Resp 08/11/22 1812 16     Temp 08/11/22 1812 97.9 F (36.6 C)     Temp Source 08/11/22 1812 Oral     SpO2 08/11/22 1812 96 %     Weight --      Height --      Head Circumference --      Peak Flow --       Pain Score 08/11/22 1824 4     Pain Loc --      Pain Edu? --      Excl. in [place]? --    No data found.  Updated Vital Signs BP 112/72 (BP Location: Left Arm)   Pulse (!) 56   Temp 97.9 F (36.6 C) (Oral)   Resp 16   SpO2 96%      Physical Exam Vitals and nursing note reviewed.  Constitutional:      General: He is not in acute distress.    Appearance: Normal appearance. He is not ill-appearing.  HENT:     Head: Normocephalic and atraumatic.  Eyes:     Conjunctiva/sclera: Conjunctivae normal.  Cardiovascular:     Rate and Rhythm: Normal rate and regular rhythm.  Pulmonary:     Effort: Pulmonary effort is normal. No respiratory distress.     Breath sounds: Normal breath sounds. No wheezing, rhonchi or rales.  Abdominal:     General: Abdomen is flat. Bowel sounds are normal. There is no distension.     Tenderness: There is abdominal tenderness (mild TTP to LUQ). There is no guarding or rebound.  Neurological:     Mental Status: He is alert.  Psychiatric:        Mood and Affect: Mood normal.        Behavior: Behavior normal.        Thought Content: Thought content normal.      UC Treatments / Results  Labs (all labs ordered are listed, but only abnormal results are displayed) Labs Reviewed - No data to display  EKG   Radiology No results found.  Procedures Procedures (including critical care time)  Medications Ordered in UC Medications - No data to display  Initial Impression / Assessment and Plan / UC Course  I  have reviewed the triage vital signs and the nursing notes.  Pertinent labs & imaging results that were available during my care of the patient were reviewed by me and considered in my medical decision making (see chart for details).    Given location of pain suspicious for gastric ulcer versus gastritis.  Will treat with omeprazole which patient has not been taking daily.  Encouraged follow-up with gastroenterology if no gradual improvement or in the  emergency room with any worsening.  Low suspicion for cardiac etiology given lack of worsening with activity, no shortness of breath, and reproducibility with palpation.  Final Clinical Impressions(s) / UC Diagnoses   Final diagnoses:  LUQ pain     Discharge Instructions       If no improvement please follow up with your PCP or gastroenterologist.   If symptoms worsen in any way please report to ED for further evaluation.      ED Prescriptions     Medication Sig Dispense Auth. Provider   omeprazole (PRILOSEC) 40 MG capsule Take 1 capsule (40 mg total) by mouth in the morning. 30 capsule Francene Finders, PA-C      PDMP not reviewed this encounter.   Francene Finders, PA-C 08/11/22 1925

## 2022-08-11 NOTE — Discharge Instructions (Signed)
  If no improvement please follow up with your PCP or gastroenterologist.   If symptoms worsen in any way please report to ED for further evaluation.

## 2022-08-11 NOTE — ED Triage Notes (Signed)
Pt states he is having left upper abdominal pain that feels like a spasm that started 2 weeks ago. Pt states the pain comes and goes but more frequently than it has been. Pt also states he is feeling nauseous, fatigue that started a week ago.

## 2022-08-17 ENCOUNTER — Ambulatory Visit
Admission: EM | Admit: 2022-08-17 | Discharge: 2022-08-17 | Disposition: A | Discharge status: Home / Self Care | Payer: Medicare Other | Attending: Internal Medicine | Admitting: Internal Medicine

## 2022-08-17 DIAGNOSIS — I10 Essential (primary) hypertension: Secondary | ICD-10-CM | POA: Diagnosis not present

## 2022-08-17 DIAGNOSIS — R0789 Other chest pain: Secondary | ICD-10-CM

## 2022-08-17 DIAGNOSIS — I959 Hypotension, unspecified: Secondary | ICD-10-CM

## 2022-08-17 DIAGNOSIS — R42 Dizziness and giddiness: Secondary | ICD-10-CM

## 2022-08-17 DIAGNOSIS — Z8673 Personal history of transient ischemic attack (TIA), and cerebral infarction without residual deficits: Secondary | ICD-10-CM | POA: Diagnosis not present

## 2022-08-17 LAB — POCT URINALYSIS DIP (MANUAL ENTRY)
Bilirubin, UA: NEGATIVE
Blood, UA: NEGATIVE
Glucose, UA: >=1000 mg/dL — AB
Leukocytes, UA: NEGATIVE
Nitrite, UA: NEGATIVE
Protein Ur, POC: NEGATIVE mg/dL
Spec Grav, UA: 1.025 (ref 1.010–1.025)
Urobilinogen, UA: 0.2 E.U./dL
pH, UA: 5.5 (ref 5.0–8.0)

## 2022-08-17 LAB — POCT FASTING CBG KUC MANUAL ENTRY: POCT Glucose (KUC): 150 mg/dL — AB (ref 70–99)

## 2022-08-17 NOTE — ED Provider Notes (Signed)
[facility]  Note:  This document was prepared using Systems analyst and may include unintentional dictation errors.  MRN: [id number] DOB: Aug 19, 1965  Subjective:   Jose Bishop is a 57 y.o. male presenting for acute onset since this morning of chest pressure, dizziness and light-headedness. Has had chronic left sided internal "knot" like sensation that causes pain.  Has extensive cardiac history including PVCs, bradycardia, thoracic aortic aneurysm, abdominal aortic aneurysm.  Also has type 2 diabetes treated with insulin, hyperlipidemia and essential hypertension.  Has very close follow-up with his cardiologist.  He just had a CT angiogram a month ago.  Patient is taking 2 blood pressure medicines, takes them in the morning.  Feels like his symptoms started thereafter.  Has a history of TIAs and reports that he feels this is very different.  Denies headache, confusion, double vision, weakness, numbness or tingling.  Insist that he does not have chest pain, heart racing, palpitations.  No diaphoresis, nausea, vomiting, abdominal pain, left arm symptoms.  No current facility-administered medications for this encounter.  Current Outpatient Medications:    APPLE CIDER VINEGAR PO, Take by mouth., Disp: , Rfl:    BLACK CURRANT SEED OIL PO, Take by mouth., Disp: , Rfl:    cetirizine (ZYRTEC) 10 MG tablet, Take 1 tablet (10 mg total) by mouth daily., Disp: 30 tablet, Rfl: 11   chlorhexidine (HIBICLENS) 4 % external liquid, Apply topically daily. For 5 days, Disp: 473 mL, Rfl: 1   chlorthalidone (HYGROTON) 25 MG tablet, TAKE 1 TABLET(25 MG) BY MOUTH DAILY, Disp: 90 tablet, Rfl: 1   CINNAMON PO, Take by mouth., Disp: , Rfl:    Continuous Blood Gluc Sensor (FREESTYLE LIBRE 3 SENSOR) MISC, 1 each by Does not apply route every 14 (fourteen) days., Disp: 6 each, Rfl: 3   empagliflozin (JARDIANCE) 25 MG TABS tablet, Take 1 tablet (25 mg total) by mouth daily before  breakfast., Disp: 90 tablet, Rfl: 1   Evolocumab with Infusor ([place]) 420 MG/3.5ML SOCT, Inject 420 mg into the skin every 30 (thirty) days., Disp: 3.6 mL, Rfl: 11   ezetimibe (ZETIA) 10 MG tablet, TAKE 1 TABLET(10 MG) BY MOUTH DAILY, Disp: 90 tablet, Rfl: 1   fluticasone (FLONASE) 50 MCG/ACT nasal spray, Place 2 sprays into both nostrils daily., Disp: 16 g, Rfl: 6   gabapentin (NEURONTIN) 300 MG capsule, TAKE 1 CAPSULE(300 MG) BY MOUTH AT BEDTIME, Disp: 30 capsule, Rfl: 3   Ginger, Zingiber officinalis, (GINGER PO), Take by mouth., Disp: , Rfl:    glimepiride (AMARYL) 2 MG tablet, 2 tabs with breakfast and 1 tab with dinner, Disp: 270 tablet, Rfl: 1   glucose blood (ONETOUCH VERIO) test strip, 1 each by Other route daily. And lancets 1/day, Disp: 100 each, Rfl: 3   ibuprofen (ADVIL) 600 MG tablet, Take 1 tablet (600 mg total) by mouth every 8 (eight) hours as needed (pain)., Disp: 15 tablet, Rfl: 0   insulin glargine (LANTUS SOLOSTAR) 100 UNIT/ML Solostar Pen, Inject 15 Units into the skin at bedtime., Disp: 15 mL, Rfl: PRN   Insulin Pen Needle 32G X 4 MM MISC, Use 1x a day, Disp: 100 each, Rfl: 3   latanoprost (XALATAN) 0.005 % ophthalmic solution, Place 1 drop into both eyes at bedtime., Disp: , Rfl:    lisinopril (ZESTRIL) 30 MG tablet, TAKE 1 TABLET(30 MG) BY MOUTH DAILY, Disp: 90 tablet, Rfl: 1   metFORMIN (GLUCOPHAGE-XR) 500 MG 24 hr tablet, TAKE  2 TABLETS(1000 MG) BY MOUTH IN THE MORNING AND AT BEDTIME, Disp: 360 tablet, Rfl: 1   mupirocin ointment (BACTROBAN) 2 %, Place 1 Application into the nose 2 (two) times daily. For 5 days, Disp: 22 g, Rfl: 1   omeprazole (PRILOSEC) 40 MG capsule, Take 1 capsule (40 mg total) by mouth in the morning., Disp: 30 capsule, Rfl: 1   OVER THE COUNTER MEDICATION, BEET SUPPLEMENT, Disp: , Rfl:    Semaglutide,0.25 or 0.5MG/DOS, 2 MG/3ML SOPN, Inject 0.5 mg into the skin once a week., Disp: 9 mL, Rfl: 3   TURMERIC PO, Take by mouth., Disp:  , Rfl:    Allergies  Allergen Reactions   Bee Venom Anaphylaxis   Dapagliflozin Pro-Metformin Er    Lipitor [Atorvastatin]     FATIGUED NAUSEA BRAIN FOG    Past Medical History:  Diagnosis Date   AAA (abdominal aortic aneurysm) (HCC)    Anxiety    Bradycardia    Bradycardia    Difficulty sleeping    Essential hypertension    Family history of stroke    Headache    History of transient ischemic attack (TIA)    15 YRS AGO   Hyperlipidemia    Impotence of organic origin    Liver cyst    Memory loss    Osteoarthritis of acromioclavicular joint    Prostate cancer (HCC)    PVC (premature ventricular contraction)    Type 2 diabetes mellitus with hyperglycemia (HCC)      Past Surgical History:  Procedure Laterality Date   HERNIA REPAIR     X2 ( 1 ING HERNIA / 1 UMBILICAL HERNIA )   LYMPHADENECTOMY Bilateral 01/04/2015   Procedure: BILATERAL LYMPHADENECTOMY;  Surgeon: Raynelle Bring, MD;  Location: [facility];  Service: Urology;  Laterality: Bilateral;   MASS EXCISION     L UPPER ARM   PENILE PROSTHESIS IMPLANT  03/18/2019   ROBOT ASSISTED LAPAROSCOPIC RADICAL PROSTATECTOMY N/A 01/04/2015   Procedure: ROBOTIC ASSISTED LAPAROSCOPIC RADICAL PROSTATECTOMY LEVEL 2;  Surgeon: Raynelle Bring, MD;  Location: [facility];  Service: Urology;  Laterality: N/A;   ROTATOR CUFF REPAIR Bilateral    SURGERY SCROTAL / TESTICULAR      Family History  Problem Relation Age of Onset   Anuerysm Mother    Stroke Mother    Hypertension Father    Peripheral vascular disease Father    Stroke Father    Colon cancer Father        39's   Diabetes Sister    Heart murmur Sister    Heart murmur Son    Stomach cancer Neg Hx    Rectal cancer Neg Hx    Throat cancer Neg Hx     Social History   Tobacco Use   Smoking status: Never   Smokeless tobacco: Never  Vaping Use   Vaping Use: Never used  Substance Use Topics   Alcohol use: Yes    Alcohol/week: 0.0 standard drinks of alcohol    Comment:  OCCASIONAL   Drug use: No    ROS   Objective:   Vitals: BP 99/66 (BP Location: Left Arm)   Pulse 82   Temp 98 F (36.7 C) (Oral)   Resp 16   SpO2 97%   BP Readings from Last 3 Encounters:  08/17/22 99/66  08/11/22 112/72  07/04/22 110/68   Physical Exam Constitutional:      General: He is not in acute distress.    Appearance: Normal appearance. He is well-developed.  He is not ill-appearing, toxic-appearing or diaphoretic.  HENT:     Head: Normocephalic and atraumatic.     Right Ear: External ear normal.     Left Ear: External ear normal.     Nose: Nose normal.     Mouth/Throat:     Mouth: Mucous membranes are moist.  Eyes:     General: No scleral icterus.       Right eye: No discharge.        Left eye: No discharge.     Extraocular Movements: Extraocular movements intact.     Conjunctiva/sclera: Conjunctivae normal.     Pupils: Pupils are equal, round, and reactive to light.  Cardiovascular:     Rate and Rhythm: Normal rate and regular rhythm.     Heart sounds: Normal heart sounds. No murmur heard.    No friction rub. No gallop.  Pulmonary:     Effort: Pulmonary effort is normal. No respiratory distress.     Breath sounds: Normal breath sounds. No stridor. No wheezing, rhonchi or rales.  Neurological:     Mental Status: He is alert and oriented to person, place, and time.     Cranial Nerves: No cranial nerve deficit.     Motor: No weakness.     Coordination: Coordination normal.     Gait: Gait normal.     Comments: Negative Romberg and pronator drift, no facial asymmetry.  Psychiatric:        Mood and Affect: Mood normal.        Behavior: Behavior normal.        Thought Content: Thought content normal.        Judgment: Judgment normal.     POC blood sugar check was 136 using his own meter in clinic.  Results for orders placed or performed during the hospital encounter of 08/17/22 (from the past 24 hour(s))  POCT urinalysis dipstick     Status: Abnormal    Collection Time: 08/17/22 11:48 AM  Result Value Ref Range   Color, UA yellow yellow   Clarity, UA clear clear   Glucose, UA >=1,000 (A) negative mg/dL   Bilirubin, UA negative negative   Ketones, POC UA trace (5) (A) negative mg/dL   Spec Grav, UA 1.025 1.010 - 1.025   Blood, UA negative negative   pH, UA 5.5 5.0 - 8.0   Protein Ur, POC negative negative mg/dL   Urobilinogen, UA 0.2 0.2 or 1.0 E.U./dL   Nitrite, UA Negative Negative   Leukocytes, UA Negative Negative   ED ECG REPORT   Date: 08/17/2022  EKG Time: 12:09 PM  Rate: 63bpm  Rhythm: normal sinus rhythm,  unchanged from previous tracings  Axis: normal  Intervals:none  ST&T Change: t-wave flattening in III, aVF  Narrative Interpretation: Sinus rhythm at 63 bpm with nonspecific T wave flattening.  Comparable to the last EKG from 06/21/2022 and improved from previous EKGs that had T wave inversion in the inferior leads and lateral leads.   Assessment and Plan :   PDMP not reviewed this encounter.  1. Dizziness   2. Sensation of chest pressure   3. History of TIA (transient ischemic attack)   4. Essential hypertension   5. Hypotension, unspecified hypotension type     I had an extensive disc show with the patient about the differential which can include an acute cardiopulmonary event, acute encephalopathy such as TIA, stroke.  Patient is not inclined to go to emergency room now.  I did discuss the  possibility that he is hypotensive from his blood pressure medications.  Recommended that he follow-up with his cardiologist today.  Will defer to them to see if they want to redirect him to the emergency room and patient was agreeable to this.  He will monitor for ER symptoms as discussed in clinic.  Otherwise have close follow-up with his PCP and cardiologist.  Counseled patient on potential for adverse effects with medications prescribed/recommended today, ER and return-to-clinic precautions discussed, patient verbalized  understanding.    Jaynee Eagles, PA-C 08/17/22 1300

## 2022-08-17 NOTE — Discharge Instructions (Addendum)
Call your cardiology office to see if you need changes to your blood pressure medications as this can sometimes lower your pressure too much causing dizziness. Otherwise, follow up with your PCP or go to the ER if you develop chest pain, weakness, numbness, tingling, have double vision.

## 2022-08-17 NOTE — ED Triage Notes (Signed)
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

## 2022-09-07 ENCOUNTER — Encounter: Payer: Self-pay | Admitting: Pharmacist

## 2022-09-07 DIAGNOSIS — G720 Drug-induced myopathy: Secondary | ICD-10-CM

## 2022-09-07 NOTE — Progress Notes (Signed)
[facility]) [place] Team Statin Quality Measure Assessment  09/07/2022  Jose Bishop 03/23/1966 [id number]  Per review of chart and payor information, patient has a diagnosis of diabetes but is not currently filling a statin prescription.  This places patient into the Statin Use In Patients with Diabetes (SUPD) measure for CMS.    Patient has documented trials of statins with reported myopathy, but no corresponding CPT codes that would exclude patient from SUPD measure. (For the current benefit year).  Patient has an upcoming appointment on 09/11/2022.  If deemed therapeutically appropriate, a statin exclusion code could be associated with the upcoming appointment (which would remove the patient from the measure).   The 10-year ASCVD risk score (Arnett DK, et al., 2019) is: 11.6%   Values used to calculate the score:     Age: 57 years     Sex: Male     Is Non-Hispanic African American: Yes     Diabetic: Yes     Tobacco smoker: No     Systolic Blood Pressure: 99 mmHg     Is BP treated: Yes     HDL Cholesterol: 59 mg/dL     Total Cholesterol: 150 mg/dL 12/21/2021     Component Value Date/Time   CHOL 150 12/21/2021 0906   TRIG 113 12/21/2021 0906   HDL 59 12/21/2021 0906   CHOLHDL 2.5 12/21/2021 0906   LDLCALC 71 12/21/2021 0906   LDLDIRECT 145 (H) 01/06/2020 1046    Please consider ONE of the following recommendations:  Initiate high intensity statin Atorvastatin 40 mg once daily, #90, 3 refills   Rosuvastatin 20 mg once daily, #90, 3 refills    Initiate moderate intensity          statin with reduced frequency if prior          statin intolerance 1x weekly, #13, 3 refills   2x weekly, #26, 3 refills   3x weekly, #39, 3 refills    Code for past statin intolerance or  other exclusions (required annually)  Provider Requirements: Associate code during an office visit or telehealth encounter  Drug Induced Myopathy G72.0   Myopathy,  unspecified G72.9   Myositis, unspecified M60.9   Rhabdomyolysis M62.82   Cirrhosis of liver K74.69   Prediabetes R73.03   PCOS E28.2   Plan: Route note to provider prior to the upcoming appointment.  Elayne Guerin, PharmD, [place] Clinical Pharmacist [phone]

## 2022-09-11 ENCOUNTER — Ambulatory Visit: Payer: Medicare Other | Attending: Family Medicine | Admitting: Family Medicine

## 2022-09-11 ENCOUNTER — Encounter: Payer: Self-pay | Admitting: Family Medicine

## 2022-09-11 VITALS — BP 116/74 | HR 60 | Temp 98.6°F | Ht 73.0 in | Wt 249.8 lb

## 2022-09-11 DIAGNOSIS — K2950 Unspecified chronic gastritis without bleeding: Secondary | ICD-10-CM

## 2022-09-11 DIAGNOSIS — Z794 Long term (current) use of insulin: Secondary | ICD-10-CM | POA: Diagnosis not present

## 2022-09-11 DIAGNOSIS — I10 Essential (primary) hypertension: Secondary | ICD-10-CM

## 2022-09-11 DIAGNOSIS — G720 Drug-induced myopathy: Secondary | ICD-10-CM | POA: Diagnosis not present

## 2022-09-11 DIAGNOSIS — E1165 Type 2 diabetes mellitus with hyperglycemia: Secondary | ICD-10-CM

## 2022-09-11 DIAGNOSIS — T466X5A Adverse effect of antihyperlipidemic and antiarteriosclerotic drugs, initial encounter: Secondary | ICD-10-CM

## 2022-09-11 MED ORDER — LISINOPRIL 20 MG PO TABS: ORAL_TABLET | ORAL | 1 refills | Status: DC

## 2022-09-11 NOTE — Patient Instructions (Signed)
Gastritis, Adult Gastritis is irritation and swelling (inflammation) of the stomach. There are two kinds of gastritis: Acute gastritis. This kind develops quickly. Chronic gastritis. This kind is much more common. It develops slowly and lasts for a long time. It is important to get help for this condition. If you do not get help, your stomach can bleed, and you can get sores (ulcers) in your stomach. What are the causes? This condition may be caused by: Germs that get to your stomach and cause an infection. Drinking too much alcohol. Medicines you are taking. Having too much acid in the stomach. Having a disease of the stomach. Other causes may include: An allergic reaction. Some cancer treatments (radiation). Smoking cigarettes or using products that contain nicotine or tobacco. In some cases, the cause of this condition is not known. What increases the risk? Having a disease of the intestines. Having Crohn's disease. Using aspirin or ibuprofen and other NSAIDs to treat other conditions. Stress. What are the signs or symptoms? Pain in your stomach. A burning feeling in your stomach. Feeling like you may vomit (nauseous). Vomiting or vomiting blood. Feeling too full after you eat. Weight loss. Bad breath. Blood in your poop (stool). In some cases, there are no symptoms. How is this treated? This condition is treated with medicines. The medicines that are used depend on what caused the condition. You may be given: Antibiotic medicine, if your condition was caused by an infection from germs. H2 blockers and similar medicines, if your condition was caused by too much acid in the stomach. Treatment may also include stopping the use of certain medicines, such as aspirin or ibuprofen. Follow these instructions at home: Medicines Take over-the-counter and prescription medicines only as told by your doctor. If you were prescribed an antibiotic medicine, take it as told by your doctor.  Do not stop taking it even if you start to feel better. Alcohol use Do not drink alcohol if: Your doctor tells you not to drink. You are pregnant, may be pregnant, or are planning to become pregnant. If you drink alcohol: Limit your use to: 0-1 drink a day for women. 0-2 drinks a day for men. Know how much alcohol is in your drink. In the [place] one drink equals one 12 oz bottle of beer (355 mL), one 5 oz glass of wine (148 mL), or one 1 oz glass of hard liquor (44 mL). General instructions  Eat small meals often, instead of large meals. Avoid foods and drinks that make you feel worse. Drink enough fluid to keep your pee (urine) pale yellow. Talk with your doctor about ways to manage stress. You can exercise or do deep breathing, meditation, or yoga. Do not smoke or use any products that contain nicotine or tobacco. If you need help quitting, ask your doctor. Keep all follow-up visits. Contact a doctor if: Your symptoms get worse. Your stomach pain gets worse. Your symptoms go away and then come back. You have a fever. Get help right away if: You vomit blood or something that looks like coffee grounds. You have black or dark red poop. You throw up any time you try to drink fluids. These symptoms may be an emergency. Get help right away. Call your local emergency services (911 in the [place] Do not wait to see if the symptoms will go away. Do not drive yourself to the hospital. Summary Gastritis is irritation and swelling (inflammation) of the stomach. You must get help for this condition. If you do   not get help, your stomach can bleed, and you can get sores (ulcers) in your stomach. You can be treated with medicines for germs or medicines to block too much acid in your stomach. This information is not intended to replace advice given to you by your health care provider. Make sure you discuss any questions you have with your health care provider. Document Revised: 10/16/2020 Document  Reviewed: 10/16/2020 Elsevier Patient Education  2023 [employer]  

## 2022-09-11 NOTE — Progress Notes (Signed)
Subjective:  Patient ID: Jose Bishop, male    DOB: 1965/11/03  Age: 57 y.o. MRN: [id number]  CC: Hypertension   HPI Jose Bishop is a 57 y.o. year old male with a history of  type 2 diabetes (A1c 10.4 managed by endocrine), hypertension, depression, DDD of lumbar spine, dilatation of ascending aorta.    Interval History:  He was informed he had a ?stomach ulcer and had to follow up with GI tomorrow but had to reschedule.  It was after an UC visit last month when he presented with left upper quadrant pain and he was prescribed omeprazole.  He still has a dry heave but epigastric pain has improved. He has no hematochezia or hematemesis. He has nausea and feels like something is in his throat. Denies eating late.  Diabetes is managed by Endocrine at last visit in 06/2022. He remains adherent with his antihypertensive but his blood pressure is on the low side.  He did have an urgent care visit for dizziness last month.  He started taking one antihypertensive in the morning and another in the evening. For his hypercholesterolemia he is on Repatha and Zetia due to statin myopathy. His ascending aortic aneurysm is being monitored by cardiology Dr. Gwenlyn Found with last CT angio performed in 06/2022 and largest diameter was 3.9 cm. Past Medical History:  Diagnosis Date   AAA (abdominal aortic aneurysm) (HCC)    Anxiety    Bradycardia    Bradycardia    Difficulty sleeping    Essential hypertension    Family history of stroke    Headache    History of transient ischemic attack (TIA)    15 YRS AGO   Hyperlipidemia    Impotence of organic origin    Liver cyst    Memory loss    Osteoarthritis of acromioclavicular joint    Prostate cancer (HCC)    PVC (premature ventricular contraction)    Type 2 diabetes mellitus with hyperglycemia ([place])     Past Surgical History:  Procedure Laterality Date   HERNIA REPAIR     X2 ( 1 ING HERNIA / 1 UMBILICAL HERNIA )   LYMPHADENECTOMY Bilateral 01/04/2015    Procedure: BILATERAL LYMPHADENECTOMY;  Surgeon: Raynelle Bring, MD;  Location: [facility];  Service: Urology;  Laterality: Bilateral;   MASS EXCISION     L UPPER ARM   PENILE PROSTHESIS IMPLANT  03/18/2019   ROBOT ASSISTED LAPAROSCOPIC RADICAL PROSTATECTOMY N/A 01/04/2015   Procedure: ROBOTIC ASSISTED LAPAROSCOPIC RADICAL PROSTATECTOMY LEVEL 2;  Surgeon: Raynelle Bring, MD;  Location: [facility];  Service: Urology;  Laterality: N/A;   ROTATOR CUFF REPAIR Bilateral    SURGERY SCROTAL / TESTICULAR      Family History  Problem Relation Age of Onset   Anuerysm Mother    Stroke Mother    Hypertension Father    Peripheral vascular disease Father    Stroke Father    Colon cancer Father        29's   Diabetes Sister    Heart murmur Sister    Heart murmur Son    Stomach cancer Neg Hx    Rectal cancer Neg Hx    Throat cancer Neg Hx     Social History   Socioeconomic History   Marital status: Married    Spouse name: Not on file   Number of children: 5   Years of education: Not on file   Highest education level: Associate degree: occupational, Hotel manager, or vocational program  Occupational  History   Occupation: part time  Tobacco Use   Smoking status: Never   Smokeless tobacco: Never  Vaping Use   Vaping Use: Never used  Substance and Sexual Activity   Alcohol use: Yes    Alcohol/week: 0.0 standard drinks of alcohol    Comment: OCCASIONAL   Drug use: No   Sexual activity: Yes    Partners: Female  Other Topics Concern   Not on file  Social History Narrative   Not on file   Social Determinants of Health   Financial Resource Strain: Low Risk  (12/01/2021)   Overall Financial Resource Strain (CARDIA)    Difficulty of Paying Living Expenses: Not very hard  Food Insecurity: No Food Insecurity (12/01/2021)   Hunger Vital Sign    Worried About Running Out of Food in the Last Year: Never true    Ran Out of Food in the Last Year: Never true  Transportation Needs: No Transportation Needs  (12/01/2021)   PRAPARE - Hydrologist (Medical): No    Lack of Transportation (Non-Medical): No  Physical Activity: Insufficiently Active (12/01/2021)   Exercise Vital Sign    Days of Exercise per Week: 3 days    Minutes of Exercise per Session: 20 min  Stress: Stress Concern Present (12/01/2021)   [place]    Feeling of Stress : To some extent  Social Connections: Moderately Integrated (12/01/2021)   Social Connection and Isolation Panel [NHANES]    Frequency of Communication with Friends and Family: Once a week    Frequency of Social Gatherings with Friends and Family: Once a week    Attends Religious Services: More than 4 times per year    Active Member of [employer] or Organizations: Yes    Attends Music therapist: More than 4 times per year    Marital Status: Married    Allergies  Allergen Reactions   Bee Venom Anaphylaxis   Dapagliflozin Pro-Metformin Er    Lipitor [Atorvastatin]     FATIGUED NAUSEA BRAIN FOG    Outpatient Medications Prior to Visit  Medication Sig Dispense Refill   APPLE CIDER VINEGAR PO Take by mouth.     BLACK CURRANT SEED OIL PO Take by mouth.     cetirizine (ZYRTEC) 10 MG tablet Take 1 tablet (10 mg total) by mouth daily. 30 tablet 11   chlorhexidine (HIBICLENS) 4 % external liquid Apply topically daily. For 5 days 473 mL 1   chlorthalidone (HYGROTON) 25 MG tablet TAKE 1 TABLET(25 MG) BY MOUTH DAILY 90 tablet 1   CINNAMON PO Take by mouth.     Continuous Blood Gluc Sensor (FREESTYLE LIBRE 3 SENSOR) MISC 1 each by Does not apply route every 14 (fourteen) days. 6 each 3   empagliflozin (JARDIANCE) 25 MG TABS tablet Take 1 tablet (25 mg total) by mouth daily before breakfast. 90 tablet 1   Evolocumab with Infusor (REPATHA PUSHTRONEX SYSTEM) 420 MG/3.5ML SOCT Inject 420 mg into the skin every 30 (thirty) days. 3.6 mL 11   ezetimibe (ZETIA) 10 MG tablet TAKE  1 TABLET(10 MG) BY MOUTH DAILY 90 tablet 1   fluticasone (FLONASE) 50 MCG/ACT nasal spray Place 2 sprays into both nostrils daily. 16 g 6   gabapentin (NEURONTIN) 300 MG capsule TAKE 1 CAPSULE(300 MG) BY MOUTH AT BEDTIME 30 capsule 3   Ginger, Zingiber officinalis, (GINGER PO) Take by mouth.     glimepiride (AMARYL) 2 MG tablet  2 tabs with breakfast and 1 tab with dinner 270 tablet 1   glucose blood (ONETOUCH VERIO) test strip 1 each by Other route daily. And lancets 1/day 100 each 3   ibuprofen (ADVIL) 600 MG tablet Take 1 tablet (600 mg total) by mouth every 8 (eight) hours as needed (pain). 15 tablet 0   insulin glargine (LANTUS SOLOSTAR) 100 UNIT/ML Solostar Pen Inject 15 Units into the skin at bedtime. 15 mL PRN   Insulin Pen Needle 32G X 4 MM MISC Use 1x a day 100 each 3   latanoprost (XALATAN) 0.005 % ophthalmic solution Place 1 drop into both eyes at bedtime.     metFORMIN (GLUCOPHAGE-XR) 500 MG 24 hr tablet TAKE 2 TABLETS(1000 MG) BY MOUTH IN THE MORNING AND AT BEDTIME 360 tablet 1   mupirocin ointment (BACTROBAN) 2 % Place 1 Application into the nose 2 (two) times daily. For 5 days 22 g 1   omeprazole (PRILOSEC) 40 MG capsule Take 1 capsule (40 mg total) by mouth in the morning. 30 capsule 1   OVER THE COUNTER MEDICATION BEET SUPPLEMENT     Semaglutide,0.25 or 0.5MG /DOS, 2 MG/3ML SOPN Inject 0.5 mg into the skin once a week. 9 mL 3   TURMERIC PO Take by mouth.     lisinopril (ZESTRIL) 30 MG tablet TAKE 1 TABLET(30 MG) BY MOUTH DAILY 90 tablet 1   No facility-administered medications prior to visit.     ROS Review of Systems  Constitutional:  Negative for activity change and appetite change.  HENT:  Negative for sinus pressure and sore throat.   Respiratory:  Negative for chest tightness, shortness of breath and wheezing.   Cardiovascular:  Negative for chest pain and palpitations.  Gastrointestinal:  Positive for abdominal pain. Negative for abdominal distention and  constipation.  Genitourinary: Negative.   Musculoskeletal: Negative.   Psychiatric/Behavioral:  Negative for behavioral problems and dysphoric mood.     Objective:  BP 116/74   Pulse 60   Temp 98.6 F (37 C) (Oral)   Ht 6\' 1"  (1.854 m)   Wt 249 lb 12.8 oz (113.3 kg)   SpO2 98%   BMI 32.96 kg/m      09/11/2022    4:31 PM 08/17/2022   11:28 AM 08/11/2022    6:12 PM  BP/Weight  Systolic BP [id number]  Diastolic BP 74 66 72  Wt. (Lbs) 249.8    BMI 32.96 kg/m2        Physical Exam Constitutional:      Appearance: He is well-developed.  Cardiovascular:     Rate and Rhythm: Normal rate.     Heart sounds: Normal heart sounds. No murmur heard. Pulmonary:     Effort: Pulmonary effort is normal.     Breath sounds: Normal breath sounds. No wheezing or rales.  Chest:     Chest wall: No tenderness.  Abdominal:     General: Bowel sounds are normal. There is no distension.     Palpations: Abdomen is soft. There is no mass.     Tenderness: There is abdominal tenderness (LUQ).  Musculoskeletal:        General: Normal range of motion.     Right lower leg: No edema.     Left lower leg: No edema.  Neurological:     Mental Status: He is alert and oriented to person, place, and time.  Psychiatric:        Mood and Affect: Mood normal.  Latest Ref Rng & Units 06/21/2022   10:03 AM 06/01/2022   11:57 AM 12/21/2021    9:07 AM  CMP  Glucose 70 - 99 mg/dL 217  215    BUN 6 - 24 mg/dL 17  26    Creatinine 0.76 - 1.27 mg/dL 1.18  1.33    Sodium 134 - 144 mmol/L 136  132    Potassium 3.5 - 5.2 mmol/L 4.9  4.4    Chloride 96 - 106 mmol/L 97  94    CO2 20 - 29 mmol/L 25  19    Calcium 8.7 - 10.2 mg/dL 9.7  10.0    Total Protein 6.0 - 8.5 g/dL   7.5   Total Bilirubin 0.0 - 1.2 mg/dL   0.7   Alkaline Phos 44 - 121 IU/L   42   AST 0 - 40 IU/L   19   ALT 0 - 44 IU/L   21     Lipid Panel     Component Value Date/Time   CHOL 150 12/21/2021 0906   TRIG 113 12/21/2021 0906    HDL 59 12/21/2021 0906   CHOLHDL 2.5 12/21/2021 0906   LDLCALC 71 12/21/2021 0906   LDLDIRECT 145 (H) 01/06/2020 1046    CBC    Component Value Date/Time   WBC 5.0 01/07/2021 1602   WBC 4.4 04/30/2019 1424   RBC 4.70 01/07/2021 1602   RBC 4.80 04/30/2019 1424   HGB 14.2 01/07/2021 1602   HCT 42.9 01/07/2021 1602   PLT 352 01/07/2021 1602   MCV 91 01/07/2021 1602   MCH 30.2 01/07/2021 1602   MCH 30.4 04/30/2019 1424   MCHC 33.1 01/07/2021 1602   MCHC 32.7 04/30/2019 1424   RDW 12.8 01/07/2021 1602   LYMPHSABS 2.5 11/16/2014 0735   MONOABS 0.5 11/16/2014 0735   EOSABS 0.4 11/16/2014 0735   BASOSABS 0.0 11/16/2014 0735    Lab Results  Component Value Date   HGBA1C 10.4 (A) 06/01/2022    The 10-year ASCVD risk score (Arnett DK, et al., 2019) is: 15.4%   Values used to calculate the score:     Age: 37 years     Sex: Male     Is Non-Hispanic African American: Yes     Diabetic: Yes     Tobacco smoker: No     Systolic Blood Pressure: 99991111 mmHg     Is BP treated: Yes     HDL Cholesterol: 59 mg/dL     Total Cholesterol: 150 mg/dL  Assessment & Plan:  1. Type 2 diabetes mellitus with hyperglycemia, with long-term current use of insulin (HCC) Uncontrolled with A1c of 10.4, goal is less than 7.0 Currently under endocrine care Management as per endocrine Counseled on Diabetic diet, my plate method, [id number] minutes of moderate intensity exercise/week Blood sugar logs with fasting goals of 80-120 mg/dl, random of less than 180 and in the event of sugars less than 60 mg/dl or greater than 400 mg/dl encouraged to notify the clinic. Advised on the need for annual eye exams, annual foot exams, Pneumonia vaccine.   2. Statin myopathy Currently on Repatha and Zetia Low-cholesterol diet  3. Essential hypertension Soft blood pressure Decrease lisinopril from 30 mg to 20 mg Continue chlorthalidone Counseled on blood pressure goal of less than 130/80, low-sodium, DASH diet, medication  compliance, 150 minutes of moderate intensity exercise per week. Discussed medication compliance, adverse effects. - lisinopril (ZESTRIL) 20 MG tablet; TAKE 1 TABLET(30 MG) BY MOUTH DAILY  Dispense: 90 tablet; Refill: 1 - Basic Metabolic Panel; Future  4. Other chronic gastritis without hemorrhage Slight left upper quadrant pain He has no hematochezia or hematemesis Continue PPI Advised to keep appointment with GI - CT Abdomen Pelvis W Contrast; Future   Meds ordered this encounter  Medications   lisinopril (ZESTRIL) 20 MG tablet    Sig: TAKE 1 TABLET(30 MG) BY MOUTH DAILY    Dispense:  90 tablet    Refill:  1    Dose decrease    Follow-up: Return in about 6 months (around 03/14/2023) for Chronic medical conditions.       Charlott Rakes, MD, FAAFP. [facility] [place], [place]   09/11/2022, 6:00 PM

## 2022-09-11 NOTE — Progress Notes (Signed)
Stomach ulcer

## 2022-09-12 ENCOUNTER — Ambulatory Visit: Payer: Medicare Other | Admitting: Nurse Practitioner

## 2022-09-21 ENCOUNTER — Encounter: Payer: Self-pay | Admitting: Internal Medicine

## 2022-09-21 ENCOUNTER — Ambulatory Visit: Payer: Medicare Other | Admitting: Internal Medicine

## 2022-09-21 VITALS — BP 122/80 | HR 80 | Ht 72.0 in | Wt 249.0 lb

## 2022-09-21 DIAGNOSIS — E1165 Type 2 diabetes mellitus with hyperglycemia: Secondary | ICD-10-CM | POA: Diagnosis not present

## 2022-09-21 DIAGNOSIS — E782 Mixed hyperlipidemia: Secondary | ICD-10-CM

## 2022-09-21 DIAGNOSIS — E1159 Type 2 diabetes mellitus with other circulatory complications: Secondary | ICD-10-CM | POA: Diagnosis not present

## 2022-09-21 LAB — POCT GLYCOSYLATED HEMOGLOBIN (HGB A1C): Hemoglobin A1C: 7.8 % — AB (ref 4.0–5.6)

## 2022-09-21 MED ORDER — FREESTYLE LIBRE 3 SENSOR MISC: 1.0000 | 3 refills | Status: DC

## 2022-09-21 NOTE — Addendum Note (Signed)
Addended by: Elta Guadeloupe on: 09/21/2022 02:18 PM   Modules accepted: Orders

## 2022-09-21 NOTE — Patient Instructions (Addendum)
Please continue: - Jardiance 25 mg before b'fast - Ozempic 0.5 mg weekly  Please return in 3-4 months.

## 2022-09-21 NOTE — Progress Notes (Signed)
Patient ID: Jose Bishop, male   DOB: 20-Sep-1965, 57 y.o.   MRN: [id number]  HPI: Jose Bishop is a 57 y.o.-year-old male, returning for follow-up for DM2, dx in 2020, non-insulin-dependent, uncontrolled, with complications (aortic atherosclerosis, history of TIA, AAA, CKD). Pt. previously saw Dr. Loanne Drilling, but last visit with me was 4 mo ago.  Interim history: + increased urination, + blurry vision, no chest pain. He has occasional nausea when delaying a meal.  He also has dry mouth despite drinking plenty of water. Since ast OV, he started a CGM, and stopped Lantus, Metformin and Glimepiride.  He was able to start [place].  Reviewed HbA1c: Lab Results  Component Value Date   HGBA1C 10.4 (A) 06/01/2022   HGBA1C 8.9 (A) 02/22/2022   HGBA1C 9.6 (A) 07/19/2021   HGBA1C 9.2 (A) 04/18/2021   HGBA1C 9.5 (H) 01/28/2021   HGBA1C 9.6 (A) 10/27/2020   HGBA1C 7.9 (H) 04/14/2020   HGBA1C 7.8 (A) 01/06/2020   HGBA1C 7.9 (H) 10/06/2019   At last visit he was on: - Metformin ER 500 mg 4x a day >> nausea/dry heaving - Glimepiride 2 mg in a.m. and before dinner - Ozempic 1 mg weekly - prev. off 2/2 doughnut hole (2 mg weekly caused nausea, could not eat meals, only ate snacks) He was on Jardiance for 1 mo >> stopped 2/2 coverage.  I advised him to change to: - Jardiance 25 mg before b'fast - restarted 06/2022 - Glimepiride 2 mg in a.m. >> stopped since last OV - Ozempic 0.5 mg weekly - restarted 06/2022 - Lantus 12 >> 3 units at bedtime >> stopped since last OV  Pt checks his sugars 4x a day:   Prev.: - am: 155-170 >> now 189-212 - 2h after b'fast: n/c - before lunch: n/c - 2h after lunch: 170-200s - before dinner: n/c - 2h after dinner: n/c - bedtime: n/c - nighttime: n/c Lowest sugar was 117 >> 60s; ? hypoglycemia awareness.  Highest sugar was 300s - steroid inj in back in 2023 >> 200. He was on insulin briefly then.  Glucometer: One Touch  Pt's meals are: -  Breakfast (2:30-3 am): apple + banana + sunflower seeds, nuts, celery and carrots - Lunch: wraps, keto bread sandwich  - Dinner: meat, starch, veggies; chips >> fish, steamed veggies - Snacks: trailmix  - + CKD, last BUN/creatinine:  Lab Results  Component Value Date   BUN 17 06/21/2022   BUN 26 (H) 06/01/2022   CREATININE 1.18 06/21/2022   CREATININE 1.33 (H) 06/01/2022  He is on Lisinopril 30 mg daily.  -+ HL; last set of lipids: Lab Results  Component Value Date   CHOL 150 12/21/2021   HDL 59 12/21/2021   LDLCALC 71 12/21/2021   LDLDIRECT 145 (H) 01/06/2020   TRIG 113 12/21/2021   CHOLHDL 2.5 12/21/2021  He Is on Zetia and Repatha.  - last eye exam was in 04/2022. No DR. DR. Katy Fitch. + cataract.  - no numbness and tingling in his feet.  Last foot exam 06/12/2022.  He also has a history of back pain-disabled due to this, prostate cancer, HTN. He had reconstructive surgery on both shoulders.   ROS: + see HPI  Past Medical History:  Diagnosis Date   AAA (abdominal aortic aneurysm) (HCC)    Anxiety    Bradycardia    Bradycardia    Difficulty sleeping    Essential hypertension    Family history of stroke    Headache  History of transient ischemic attack (TIA)    15 YRS AGO   Hyperlipidemia    Impotence of organic origin    Liver cyst    Memory loss    Osteoarthritis of acromioclavicular joint    Prostate cancer ([place])    PVC (premature ventricular contraction)    Type 2 diabetes mellitus with hyperglycemia ([place])    Past Surgical History:  Procedure Laterality Date   HERNIA REPAIR     X2 ( 1 ING HERNIA / 1 UMBILICAL HERNIA )   LYMPHADENECTOMY Bilateral 01/04/2015   Procedure: BILATERAL LYMPHADENECTOMY;  Surgeon: Raynelle Bring, MD;  Location: [facility];  Service: Urology;  Laterality: Bilateral;   MASS EXCISION     L UPPER ARM   PENILE PROSTHESIS IMPLANT  03/18/2019   ROBOT ASSISTED LAPAROSCOPIC RADICAL PROSTATECTOMY N/A 01/04/2015   Procedure: ROBOTIC ASSISTED  LAPAROSCOPIC RADICAL PROSTATECTOMY LEVEL 2;  Surgeon: Raynelle Bring, MD;  Location: [facility];  Service: Urology;  Laterality: N/A;   ROTATOR CUFF REPAIR Bilateral    SURGERY SCROTAL / TESTICULAR     Social History   Socioeconomic History   Marital status: Married    Spouse name: Not on file   Number of children: 5   Years of education: Not on file   Highest education level: Associate degree: occupational, Hotel manager, or vocational program  Occupational History   Occupation: part time  Tobacco Use   Smoking status: Never   Smokeless tobacco: Never  Vaping Use   Vaping Use: Never used  Substance and Sexual Activity   Alcohol use: Yes    Alcohol/week: 0.0 standard drinks of alcohol    Comment: OCCASIONAL   Drug use: No   Sexual activity: Yes    Partners: Female  Other Topics Concern   Not on file  Social History Narrative   Not on file   Social Determinants of Health   Financial Resource Strain: Low Risk  (12/01/2021)   Overall Financial Resource Strain (CARDIA)    Difficulty of Paying Living Expenses: Not very hard  Food Insecurity: No Food Insecurity (12/01/2021)   Hunger Vital Sign    Worried About Running Out of Food in the Last Year: Never true    Ran Out of Food in the Last Year: Never true  Transportation Needs: No Transportation Needs (12/01/2021)   PRAPARE - Hydrologist (Medical): No    Lack of Transportation (Non-Medical): No  Physical Activity: Insufficiently Active (12/01/2021)   Exercise Vital Sign    Days of Exercise per Week: 3 days    Minutes of Exercise per Session: 20 min  Stress: Stress Concern Present (12/01/2021)   [place]    Feeling of Stress : To some extent  Social Connections: Moderately Integrated (12/01/2021)   Social Connection and Isolation Panel [NHANES]    Frequency of Communication with Friends and Family: Once a week    Frequency of Social Gatherings  with Friends and Family: Once a week    Attends Religious Services: More than 4 times per year    Active Member of [employer] or Organizations: Yes    Attends Archivist Meetings: More than 4 times per year    Marital Status: Married  Human resources officer Violence: Not At Risk (12/01/2021)   Humiliation, Afraid, Rape, and Kick questionnaire    Fear of Current or Ex-Partner: No    Emotionally Abused: No    Physically Abused: No  Sexually Abused: No   Current Outpatient Medications on File Prior to Visit  Medication Sig Dispense Refill   APPLE CIDER VINEGAR PO Take by mouth.     BLACK CURRANT SEED OIL PO Take by mouth.     cetirizine (ZYRTEC) 10 MG tablet Take 1 tablet (10 mg total) by mouth daily. 30 tablet 11   chlorhexidine (HIBICLENS) 4 % external liquid Apply topically daily. For 5 days 473 mL 1   chlorthalidone (HYGROTON) 25 MG tablet TAKE 1 TABLET(25 MG) BY MOUTH DAILY 90 tablet 1   CINNAMON PO Take by mouth.     Continuous Blood Gluc Sensor (FREESTYLE LIBRE 3 SENSOR) MISC 1 each by Does not apply route every 14 (fourteen) days. 6 each 3   empagliflozin (JARDIANCE) 25 MG TABS tablet Take 1 tablet (25 mg total) by mouth daily before breakfast. 90 tablet 1   Evolocumab with Infusor (REPATHA PUSHTRONEX SYSTEM) 420 MG/3.5ML SOCT Inject 420 mg into the skin every 30 (thirty) days. 3.6 mL 11   ezetimibe (ZETIA) 10 MG tablet TAKE 1 TABLET(10 MG) BY MOUTH DAILY 90 tablet 1   fluticasone (FLONASE) 50 MCG/ACT nasal spray Place 2 sprays into both nostrils daily. 16 g 6   gabapentin (NEURONTIN) 300 MG capsule TAKE 1 CAPSULE(300 MG) BY MOUTH AT BEDTIME 30 capsule 3   Ginger, Zingiber officinalis, (GINGER PO) Take by mouth.     glimepiride (AMARYL) 2 MG tablet 2 tabs with breakfast and 1 tab with dinner 270 tablet 1   glucose blood (ONETOUCH VERIO) test strip 1 each by Other route daily. And lancets 1/day 100 each 3   ibuprofen (ADVIL) 600 MG tablet Take 1 tablet (600 mg total) by mouth every 8  (eight) hours as needed (pain). 15 tablet 0   insulin glargine (LANTUS SOLOSTAR) 100 UNIT/ML Solostar Pen Inject 15 Units into the skin at bedtime. 15 mL PRN   Insulin Pen Needle 32G X 4 MM MISC Use 1x a day 100 each 3   latanoprost (XALATAN) 0.005 % ophthalmic solution Place 1 drop into both eyes at bedtime.     lisinopril (ZESTRIL) 20 MG tablet TAKE 1 TABLET(30 MG) BY MOUTH DAILY 90 tablet 1   metFORMIN (GLUCOPHAGE-XR) 500 MG 24 hr tablet TAKE 2 TABLETS(1000 MG) BY MOUTH IN THE MORNING AND AT BEDTIME 360 tablet 1   mupirocin ointment (BACTROBAN) 2 % Place 1 Application into the nose 2 (two) times daily. For 5 days 22 g 1   omeprazole (PRILOSEC) 40 MG capsule Take 1 capsule (40 mg total) by mouth in the morning. 30 capsule 1   OVER THE COUNTER MEDICATION BEET SUPPLEMENT     Semaglutide,0.25 or 0.5MG /DOS, 2 MG/3ML SOPN Inject 0.5 mg into the skin once a week. 9 mL 3   TURMERIC PO Take by mouth.     No current facility-administered medications on file prior to visit.   Allergies  Allergen Reactions   Bee Venom Anaphylaxis   Dapagliflozin Pro-Metformin Er    Lipitor [Atorvastatin]     FATIGUED NAUSEA BRAIN FOG   Family History  Problem Relation Age of Onset   Anuerysm Mother    Stroke Mother    Hypertension Father    Peripheral vascular disease Father    Stroke Father    Colon cancer Father        36's   Diabetes Sister    Heart murmur Sister    Heart murmur Son    Stomach cancer Neg Hx  Rectal cancer Neg Hx    Throat cancer Neg Hx    PE: BP 122/80   Pulse 80   Ht 6' (1.829 m)   Wt 249 lb (112.9 kg)   SpO2 98%   BMI 33.77 kg/m  Wt Readings from Last 3 Encounters:  09/21/22 249 lb (112.9 kg)  09/11/22 249 lb 12.8 oz (113.3 kg)  07/31/22 258 lb (117 kg)   Constitutional: overweight, in NAD Eyes:  EOMI, no exophthalmos ENT: no neck masses, no cervical lymphadenopathy Cardiovascular: RRR, No MRG Respiratory: CTA B Musculoskeletal: no deformities Skin:no  rashes Neurological: no tremor with outstretched hands  ASSESSMENT: 1. DM2, non-insulin-dependent, uncontrolled, with complications - Aortic ATC - per CTA 06/30/2022 - AAA - TIA - CKD  2. HL  PLAN:  1. Patient with longstanding, uncontrolled, type 2 diabetes, on oral antidiabetic regimen with SGLT2 inhibitor, and GLP1 R agonist  -restarted Jardiance and Ozempic after coming out of the donut hole at the beginning of the year.  At last visit he was off Jardiance and also off Ozempic for 2 months and HbA1c was 10.4%, higher.  He was on metformin and glimepiride.  Blood sugars were high in the morning, above target and they were little high later in the day, after lunch, but he was not taking in the evening.  I did advise him to check more frequently and I suggested a CGM.  He very much wanted to try this.  I sent a prescription to his pharmacy.  I also amended to add basal insulin.  I advised him how to titrate the dose.  I continued his metformin at that time.  We also discussed about the need to improve diet and I made several suggestions including avoiding dried fruit, chips. -since last visit,he contacted me about possibly starting back on Jardiance, which she was able to do at the end of 06/2022.  He stopped metformin when starting Jardiance (due to GI symptoms).  He also stopped bumetanide due to low blood sugars.  He started Lantus since last visit but he was taking a different dose than recommended, approximately 3 units, and he stopped this when he saw that sugars started to improve. -He lost 13 pounds since last visit! CGM interpretation: -At today's visit, we reviewed his CGM downloads: It appears that 92% of values are in target range (goal >70%), while 8% are higher than 180 (goal <25%), and 0% are lower than 70 (goal <4%).  The calculated average blood sugar is 141.  The projected HbA1c for the next 3 months ([facility]) is 6.7 percent. -Reviewing the CGM trends, sugars appear to be  spectacularly improved since last visit.  They are mostly fluctuating within the target range.  I agree with him that he does not need glimepiride or insulin for now.  Will continue Jardiance and Ozempic.  We did discuss about potentially decreasing the dose of Jardiance due to dry mouth, but he mentions that this is not very bothersome and would like to continue the current regimen. - I suggested to:  Patient Instructions  Please continue: - Jardiance 25 mg before b'fast - Ozempic 0.5 mg weekly  Please return in 3-4 months.  - we checked his HbA1c: 7.8% (much lower) - advised to check sugars at different times of the day - 4x a day, rotating check times - advised for yearly eye exams >> he is UTD - return to clinic in 3-4 months  2. HL - Reviewed latest lipid panel from 11/2021: LDL  higher than our goal of less than 55 due to cardiovascular disease: Lab Results  Component Value Date   CHOL 150 12/21/2021   HDL 59 12/21/2021   LDLCALC 71 12/21/2021   LDLDIRECT 145 (H) 01/06/2020   TRIG 113 12/21/2021   CHOLHDL 2.5 12/21/2021  -Continue Zetia 10 mg daily and Repatha without side effects  Philemon Kingdom, MD PhD [facility]

## 2022-09-29 ENCOUNTER — Ambulatory Visit: Payer: Medicare Other | Admitting: Dietician

## 2022-10-12 ENCOUNTER — Inpatient Hospital Stay: Admission: RE | Admit: 2022-10-12 | Payer: Medicare Other | Source: Ambulatory Visit

## 2022-10-16 ENCOUNTER — Ambulatory Visit
Admission: RE | Admit: 2022-10-16 | Discharge: 2022-10-16 | Disposition: A | Discharge status: Home / Self Care | Payer: Medicare Other | Source: Ambulatory Visit | Attending: Family Medicine | Admitting: Family Medicine

## 2022-10-16 DIAGNOSIS — K4090 Unilateral inguinal hernia, without obstruction or gangrene, not specified as recurrent: Secondary | ICD-10-CM | POA: Diagnosis not present

## 2022-10-16 DIAGNOSIS — K314 Gastric diverticulum: Secondary | ICD-10-CM | POA: Diagnosis not present

## 2022-10-16 DIAGNOSIS — K7689 Other specified diseases of liver: Secondary | ICD-10-CM | POA: Diagnosis not present

## 2022-10-16 DIAGNOSIS — K2950 Unspecified chronic gastritis without bleeding: Secondary | ICD-10-CM

## 2022-10-16 MED ORDER — IOPAMIDOL (ISOVUE-300) INJECTION 61%
100.0000 mL | Freq: Once | INTRAVENOUS | Status: AC | PRN
  Administered 2022-10-16: 100 mL via INTRAVENOUS

## 2022-10-19 ENCOUNTER — Ambulatory Visit: Payer: Medicare Other | Admitting: Nurse Practitioner

## 2022-10-19 ENCOUNTER — Encounter: Payer: Self-pay | Admitting: Nurse Practitioner

## 2022-10-19 ENCOUNTER — Other Ambulatory Visit (INDEPENDENT_AMBULATORY_CARE_PROVIDER_SITE_OTHER): Payer: Medicare Other

## 2022-10-19 VITALS — BP 126/74 | HR 63 | Ht 72.0 in | Wt 247.4 lb

## 2022-10-19 DIAGNOSIS — Z794 Long term (current) use of insulin: Secondary | ICD-10-CM | POA: Diagnosis not present

## 2022-10-19 DIAGNOSIS — R1012 Left upper quadrant pain: Secondary | ICD-10-CM

## 2022-10-19 DIAGNOSIS — E1165 Type 2 diabetes mellitus with hyperglycemia: Secondary | ICD-10-CM

## 2022-10-19 LAB — COMPREHENSIVE METABOLIC PANEL
ALT: 24 U/L (ref 0–53)
AST: 23 U/L (ref 0–37)
Albumin: 4.3 g/dL (ref 3.5–5.2)
Alkaline Phosphatase: 46 U/L (ref 39–117)
BUN: 22 mg/dL (ref 6–23)
CO2: 25 mEq/L (ref 19–32)
Calcium: 9.5 mg/dL (ref 8.4–10.5)
Chloride: 98 mEq/L (ref 96–112)
Creatinine, Ser: 1.15 mg/dL (ref 0.40–1.50)
GFR: 71.34 mL/min (ref >60.00)
Glucose, Bld: 131 mg/dL — ABNORMAL HIGH (ref 70–99)
Potassium: 3.4 mEq/L — ABNORMAL LOW (ref 3.5–5.1)
Sodium: 134 mEq/L — ABNORMAL LOW (ref 135–145)
Total Bilirubin: 0.4 mg/dL (ref 0.2–1.2)
Total Protein: 7.6 g/dL (ref 6.0–8.3)

## 2022-10-19 LAB — CBC WITH DIFFERENTIAL/PLATELET
Basophils Absolute: 0 10*3/uL (ref 0.0–0.1)
Basophils Relative: 0.8 % (ref 0.0–3.0)
Eosinophils Absolute: 0.5 10*3/uL (ref 0.0–0.7)
Eosinophils Relative: 8.9 % — ABNORMAL HIGH (ref 0.0–5.0)
HCT: 48.1 % (ref 39.0–52.0)
Hemoglobin: 16.2 g/dL (ref 13.0–17.0)
Lymphocytes Relative: 53.1 % — ABNORMAL HIGH (ref 12.0–46.0)
Lymphs Abs: 3.2 10*3/uL (ref 0.7–4.0)
MCHC: 33.8 g/dL (ref 30.0–36.0)
MCV: 92.2 fl (ref 78.0–100.0)
Monocytes Absolute: 0.5 10*3/uL (ref 0.1–1.0)
Monocytes Relative: 8.2 % (ref 3.0–12.0)
Neutro Abs: 1.7 10*3/uL (ref 1.4–7.7)
Neutrophils Relative %: 29 % — ABNORMAL LOW (ref 43.0–77.0)
Platelets: 340 10*3/uL (ref 150.0–400.0)
RBC: 5.22 Mil/uL (ref 4.22–5.81)
RDW: 13.5 % (ref 11.5–15.5)
WBC: 6 10*3/uL (ref 4.0–10.5)

## 2022-10-19 LAB — LIPASE: Lipase: 83 U/L — ABNORMAL HIGH (ref 11.0–59.0)

## 2022-10-19 NOTE — Progress Notes (Signed)
Agree with assessment and plan as outlined.  Will await labs.  I do not feel strongly he needs dedicated imaging regarding his liver cysts especially if these have been seen before and his liver enzymes are normal, and the cysts are small, no dominant cyst..  Does not appear he has any renal cysts.  Could have isolated polycystic liver disease which is rather rare but monitoring clinically for now is reasonable.

## 2022-10-19 NOTE — Patient Instructions (Signed)
Your provider has requested that you go to the basement level for lab work before leaving today. Press "B" on the elevator. The lab is located at the first door on the left as you exit the elevator.  Continue Omeprazole once daily.  Contact our office if left upper quadrant pain recurs  Due to recent changes in healthcare laws, you may see the results of your imaging and laboratory studies on MyChart before your provider has had a chance to review them.  We understand that in some cases there may be results that are confusing or concerning to you. Not all laboratory results come back in the same time frame and the provider may be waiting for multiple results in order to interpret others.  Please give [place] 48 hours in order for your provider to thoroughly review all the results before contacting the office for clarification of your results.   Thank you for trusting me with your gastrointestinal care!   Alcide Evener, CRNP

## 2022-10-19 NOTE — Progress Notes (Signed)
10/19/2022 Jose Bishop [id number] [birth year]   Chief Complaint: LUQ pain  History of Present Illness: Jose Bishop is a 57 year old male with a past medical history of for lipidemia, hypertension, diabetes mellitus type 2, obesity, prostate cancer, GERD and adenomatous colon polyps.  He presents today with complaints of LUQ pain with associated nausea and intermittent dry heaves out vomiting which started 2 months ago and occurred daily for 4 weeks. His LUQ pain occurred randomly when sitting, driving or when walking and was unrelated to eating. He started taking Omeprazole  QD and 3 to 4 weeks later, his LUQ pain and nausea abated.  He also took Alka-Seltzer and Pepto-Bismol a few times with symptom relief.  He denies having any issues with constipation.  He passes 2-3 normal formed brown bowel movements daily.  No rectal bleeding or black stools.  He was seen by his PCP and a CTAP was done 10/18/2022 which identified diverticulosis without evidence of diverticulitis, innumerable cysts throughout the liver without suspicious solid lesions and a small fat-containing right inguinal hernia.  He underwent an EGD by Dr. Christella Hartigan 10/25/2021 which showed a benign appearing intrinsic stenosis at the GE junction which was dilated, a small hiatal hernia and reactive gastropathy without evidence of H. pylori.  He underwent a colonoscopy on the same date which was normal.  His diabetes is poorly controlled. HgA1c 10.4 on 06/01/2022.  He started Ozempic about 1 year ago and then was off of it November 2023 through early February 2024 as he was in the donut hole with his prescription coverage.  He restarted Ozempic late February 2024 and was unable to refill the prescription and has not taken it for the past 3 weeks.     Latest Ref Rng & Units 01/07/2021    4:02 PM 04/30/2019    2:24 PM 01/26/2016    9:47 AM  CBC  WBC 3.4 - 10.8 x10E3/uL 5.0  4.4  5.9   Hemoglobin 13.0 - 17.7 g/dL 21.3  08.6  57.8    Hematocrit 37.5 - 51.0 % 42.9  44.6  46.4   Platelets 150 - 450 x10E3/uL 352  315  260        Latest Ref Rng & Units 06/21/2022   10:03 AM 06/01/2022   11:57 AM 12/21/2021    9:07 AM  CMP  Glucose 70 - 99 mg/dL 469  629    BUN 6 - 24 mg/dL 17  26    Creatinine 5.28 - 1.27 mg/dL 4.13  2.44    Sodium 010 - 144 mmol/L 136  132    Potassium 3.5 - 5.2 mmol/L 4.9  4.4    Chloride 96 - 106 mmol/L 97  94    CO2 20 - 29 mmol/L 25  19    Calcium 8.7 - 10.2 mg/dL 9.7  27.2    Total Protein 6.0 - 8.5 g/dL   7.5   Total Bilirubin 0.0 - 1.2 mg/dL   0.7   Alkaline Phos 44 - 121 IU/L   42   AST 0 - 40 IU/L   19   ALT 0 - 44 IU/L   21     CTAP 10/18/2022:  EXAM: CT ABDOMEN AND PELVIS WITH CONTRAST   TECHNIQUE: Multidetector CT imaging of the abdomen and pelvis was performed using the standard protocol following bolus administration of intravenous contrast.   RADIATION DOSE REDUCTION: This exam was performed according to the departmental dose-optimization program which includes automated exposure  control, adjustment of the mA and/or kV according to patient size and/or use of iterative reconstruction technique.   CONTRAST:  ISOVUE-300 IOPAMIDOL (ISOVUE-300) INJECTION 61%   COMPARISON:  CT abdomen pelvis 05/03/2020   FINDINGS: Lower chest: No acute abnormality.   Hepatobiliary: Innumerable cysts throughout the liver, similar to prior. No new suspicious solid lesion. Normal appearance of the gallbladder. No intra or extrahepatic biliary duct dilation.   Pancreas: Unremarkable. No pancreatic ductal dilatation or surrounding inflammatory changes.   Spleen: Normal in size without focal abnormality.   Adrenals/Urinary Tract: Adrenal glands are unremarkable. Kidneys are normal, without renal calculi, focal lesion, or hydronephrosis. Bladder is unremarkable.   Stomach/Bowel: Few colonic stomach is within normal limits. Appendix appears normal. No evidence of bowel wall  thickening, distention, or inflammatory changes. Diverticula without evidence of diverticulitis.   Vascular/Lymphatic: No significant vascular findings are present. No enlarged abdominal or pelvic lymph nodes.   Reproductive: Penile pump in place.   Other: Small fat containing right inguinal hernia.   Musculoskeletal: No acute or significant osseous findings.   IMPRESSION: 1. No acute findings in the abdomen or pelvis. No CT finding to explain the patient's upper abdominal pain. 2. Diverticulosis without evidence of diverticulitis.  PAST GI PROCEDURES:  EGD 10/25/2021: - Mild, non-specific distal gastritis. Biopsied to check for H. pylori.  - One benign-appearing, intrinsic stenosis was found at the gastroesophageal junction (thin Schatzki's type ring). Dilation with an 18-19-20 mm TTS balloon dilator was performed to 20 mm.  - Small hiatal hernia.  - The examination was otherwise normal. - ANTRAL MUCOSA WITH CHANGES OF REACTIVE GASTROPATHY. - NO HELICOBACTER PYLORI IDENTIFIED.  Colonoscopy 10/25/2021: - Diverticulosis in the left colon. - The examination was otherwise normal on direct and retroflexion views.  - No polyps or cancers. - Recall colonoscopy 5 years   Current Outpatient Medications on File Prior to Visit  Medication Sig Dispense Refill   APPLE CIDER VINEGAR PO Take by mouth.     BLACK CURRANT SEED OIL PO Take by mouth.     cetirizine (ZYRTEC) 10 MG tablet Take 1 tablet (10 mg total) by mouth daily. 30 tablet 11   chlorthalidone (HYGROTON) 25 MG tablet TAKE 1 TABLET(25 MG) BY MOUTH DAILY 90 tablet 1   CINNAMON PO Take by mouth.     Continuous Blood Gluc Sensor (FREESTYLE LIBRE 3 SENSOR) MISC 1 each by Does not apply route every 14 (fourteen) days. 6 each 3   Evolocumab with Infusor (REPATHA PUSHTRONEX SYSTEM) 420 MG/3.5ML SOCT Inject 420 mg into the skin every 30 (thirty) days. 3.6 mL 11   ezetimibe (ZETIA) 10 MG tablet TAKE 1 TABLET(10 MG) BY MOUTH DAILY 90  tablet 1   fluticasone (FLONASE) 50 MCG/ACT nasal spray Place 2 sprays into both nostrils daily. 16 g 6   gabapentin (NEURONTIN) 300 MG capsule TAKE 1 CAPSULE(300 MG) BY MOUTH AT BEDTIME 30 capsule 3   Ginger, Zingiber officinalis, (GINGER PO) Take by mouth.     glucose blood (ONETOUCH VERIO) test strip 1 each by Other route daily. And lancets 1/day 100 each 3   ibuprofen (ADVIL) 600 MG tablet Take 1 tablet (600 mg total) by mouth every 8 (eight) hours as needed (pain). 15 tablet 0   Insulin Pen Needle 32G X 4 MM MISC Use 1x a day 100 each 3   latanoprost (XALATAN) 0.005 % ophthalmic solution Place 1 drop into both eyes at bedtime.     lisinopril (ZESTRIL) 20 MG tablet TAKE  1 TABLET(30 MG) BY MOUTH DAILY 90 tablet 1   metFORMIN (GLUCOPHAGE) 500 MG tablet Take by mouth 2 (two) times daily with a meal. Take 1 tablet in the morning and 1 tablet at noon, and 2 tablets at bedtime     omeprazole (PRILOSEC) 40 MG capsule Take 1 capsule (40 mg total) by mouth in the morning. 30 capsule 1   OVER THE COUNTER MEDICATION BEET SUPPLEMENT     TURMERIC PO Take by mouth.     No current facility-administered medications on file prior to visit.   Allergies  Allergen Reactions   Bee Venom Anaphylaxis   Dapagliflozin Pro-Metformin Er    Lipitor [Atorvastatin]     FATIGUED NAUSEA BRAIN FOG   Current Medications, Allergies, Past Medical History, Past Surgical History, Family History and Social History were reviewed in [employer] record.  Review of Systems:   Constitutional: Negative for fever, sweats, chills or weight loss.  Respiratory: Negative for shortness of breath.   Cardiovascular: Negative for chest pain, palpitations and leg swelling.  Gastrointestinal: See HPI.  Musculoskeletal: + Back and shoulder pain.  Neurological: Negative for dizziness, headaches or paresthesias.   Physical Exam: BP 126/74   Pulse 63   Ht 6' (1.829 m)   Wt 247 lb 6 oz (112.2 kg)   SpO2 96%    BMI 33.55 kg/m  General: 57 year old male in no acute distress. Head: Normocephalic and atraumatic. Eyes: No scleral icterus. Conjunctiva pink . Ears: Normal auditory acuity. Mouth: Dentition intact. No ulcers or lesions.  Lungs: Clear throughout to auscultation. Heart: Regular rate and rhythm, no murmur. Abdomen: Soft, nondistended.  Very mild tenderness to the LUQ without rebound or guarding.  No abdominal wall hernia assessed when abdominal wall tense.  No masses or hepatomegaly. Normal bowel sounds x 4 quadrants.  Rectal: Deferred. Musculoskeletal: Symmetrical with no gross deformities. Extremities: No edema. Neurological: Alert oriented x 4. No focal deficits.  Psychological: Alert and cooperative. Normal mood and affect  Assessment and Recommendations:  57 year old male with a history of GERD with left upper quadrant pain/bulge with associated nausea and dry heaves without vomiting which started 2 months ago and persisted daily for 1 month and abated 1 month later. CTAP 10/18/2022 findings did not explain his symptoms. EGD 10/25/2021 identified reactive gastropathy without evidence of H. pylori and benign intrinsic stenosis of the GE junction which was dilated. Ozempic may be a contributing factor (ran out of supply 3 weeks ago). RUQ pain abated after starting Omeprazole. -Continue Omeprazole 40 mg daily for the next 2 to 3 months -Patient to contact office if LUQ pain/bulge recurs -CBC, CMP and lipase level  Innumerable liver cysts -Liver cysts are benign lesions, however, when there are innumerable cysts I question if we should go ahead and get an abdominal MRI at some point. I will discuss further with Dr. Adela Lank.  Colon cancer screening.  Colonoscopy 10/25/2021 showed diverticulosis of left colon without colon polyps.  Father with history of colon cancer the age of 12. -Next colonoscopy due 10/2018  DM II, patient has been off Ozempic for the past 3 weeks. -Patient to contact  our office if LUQ pain Jose Bishop after he restarts Ozempic  History of prostate cancer

## 2022-10-20 ENCOUNTER — Telehealth: Payer: Self-pay

## 2022-10-20 ENCOUNTER — Other Ambulatory Visit: Payer: Self-pay

## 2022-10-20 DIAGNOSIS — Z794 Long term (current) use of insulin: Secondary | ICD-10-CM

## 2022-10-20 DIAGNOSIS — R1012 Left upper quadrant pain: Secondary | ICD-10-CM

## 2022-10-20 NOTE — Telephone Encounter (Signed)
Contacted pt & pt verbalized understanding of lab results. Pt also verbalized understanding to come to our lab in 4 weeks to repeat Lipase lab.

## 2022-10-23 NOTE — Progress Notes (Signed)
Contacted pt & pt verbalized understanding of lab results & is aware to return in 4 weeks

## 2022-10-23 NOTE — Telephone Encounter (Signed)
Patient picked up paper work for both

## 2022-10-26 ENCOUNTER — Telehealth: Payer: Self-pay | Admitting: Internal Medicine

## 2022-10-26 DIAGNOSIS — H25813 Combined forms of age-related cataract, bilateral: Secondary | ICD-10-CM | POA: Diagnosis not present

## 2022-10-26 DIAGNOSIS — H40053 Ocular hypertension, bilateral: Secondary | ICD-10-CM | POA: Diagnosis not present

## 2022-10-26 DIAGNOSIS — E119 Type 2 diabetes mellitus without complications: Secondary | ICD-10-CM | POA: Diagnosis not present

## 2022-10-26 LAB — HM DIABETES EYE EXAM

## 2022-10-26 NOTE — Telephone Encounter (Signed)
Patient of Dr. Charlean Sanfilippo is in waiting room saying that he thinks his Josephine Igo 3 is malfunctioning.  His level went from 140 to 324 and since he has been here it went up 343.

## 2022-10-26 NOTE — Telephone Encounter (Signed)
Pt given a sample of Ozempic and Jardiance. Advised to complete patient assistance application to be processed.

## 2022-11-02 IMAGING — CT CT ANGIO CHEST
3 of 8 series · 18 of 46 positions shown · IV contrast (OMNIPAQUE 350)
Comparison: 10/02/2019

CLINICAL DATA: 55-year-old male with a history of thoracic aortic
aneurysm

EXAM:
CT ANGIOGRAPHY CHEST WITH CONTRAST
TECHNIQUE: Multidetector CT imaging of the chest was performed using the
standard protocol during bolus administration of intravenous
contrast. Multiplanar CT image reconstructions and MIPs were
obtained to evaluate the vascular anatomy.
CONTRAST:  100mL OMNIPAQUE IOHEXOL 350 MG/ML SOLN

[Series 4: aorta 3.0 bf37 2 · axial · 0.78mm/px · z∈[-266,+16]mm · 13 of 110 slices shown]
[im 8/110  lung]
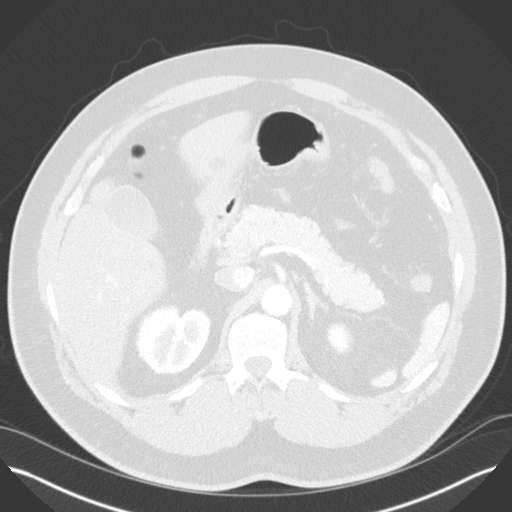
[im 16/110  soft-tissue]
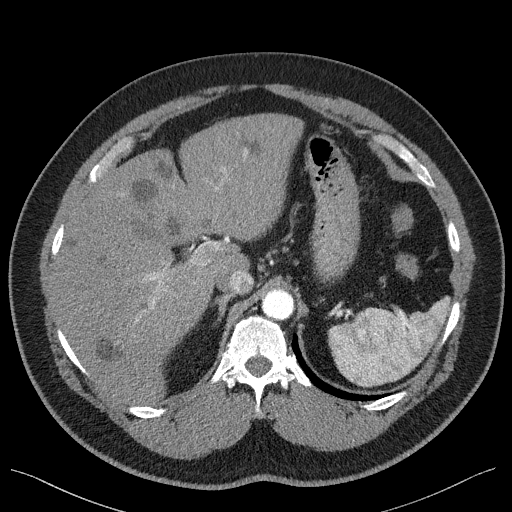
[im 24/110  lung]
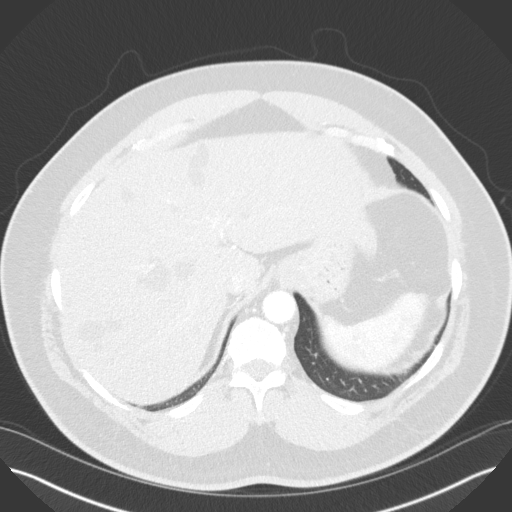
[im 32/110  soft-tissue]
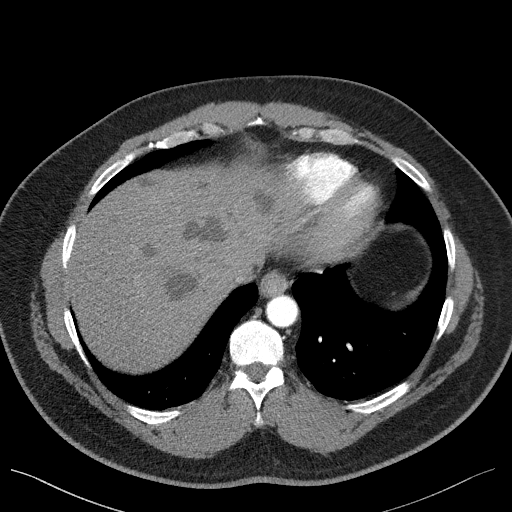
[im 39/110  lung]
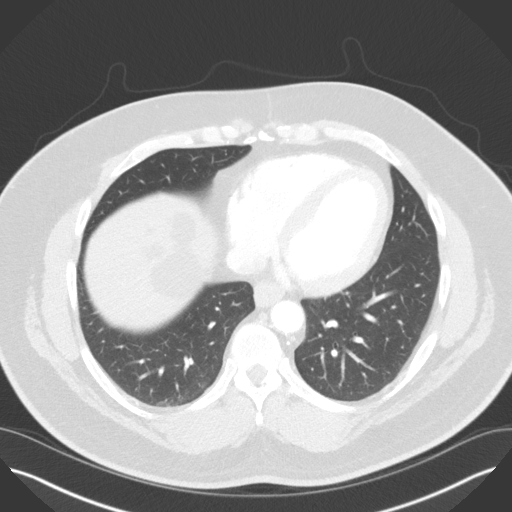
[im 47/110  soft-tissue]
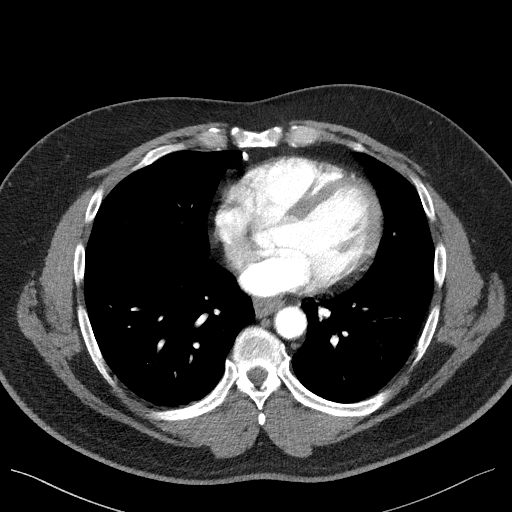
[im 55/110  lung]
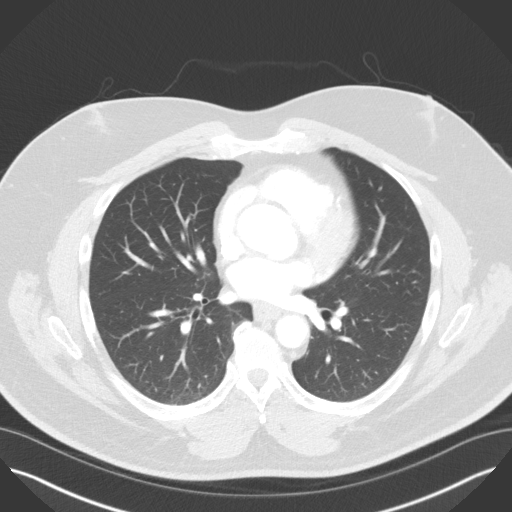
[im 63/110  soft-tissue]
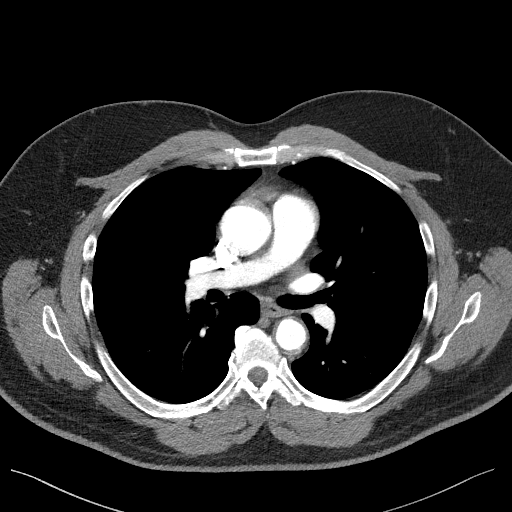
[im 71/110  lung]
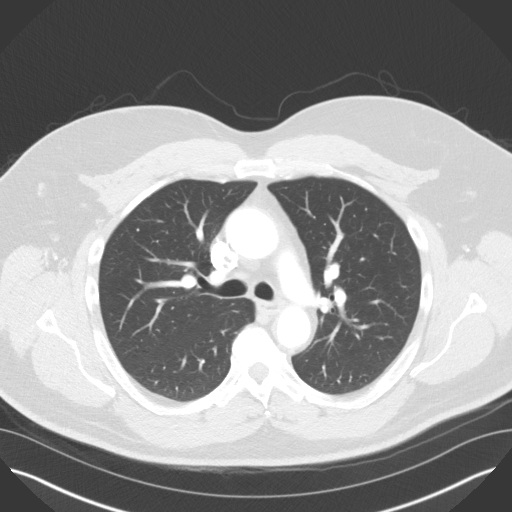
[im 78/110  soft-tissue]
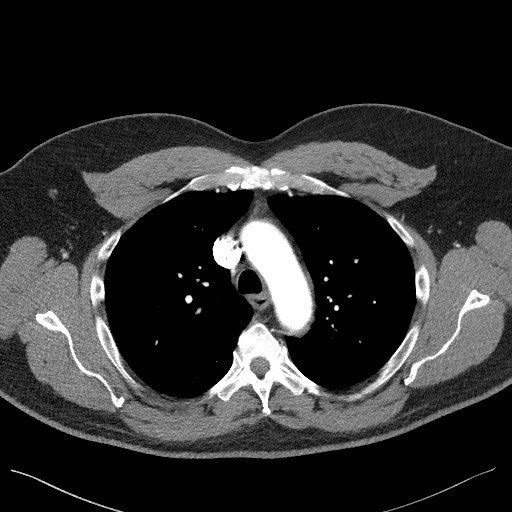
[im 86/110  lung]
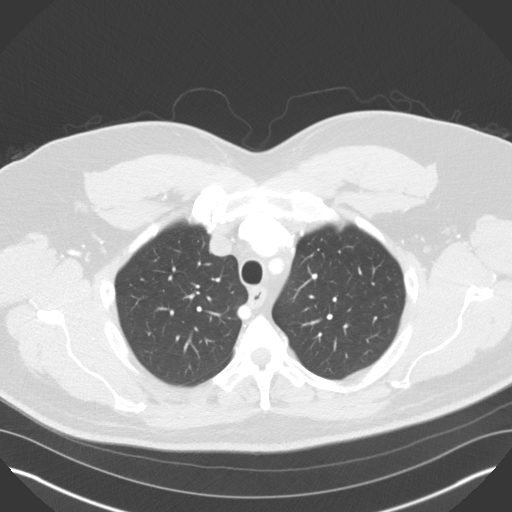
[im 94/110  soft-tissue]
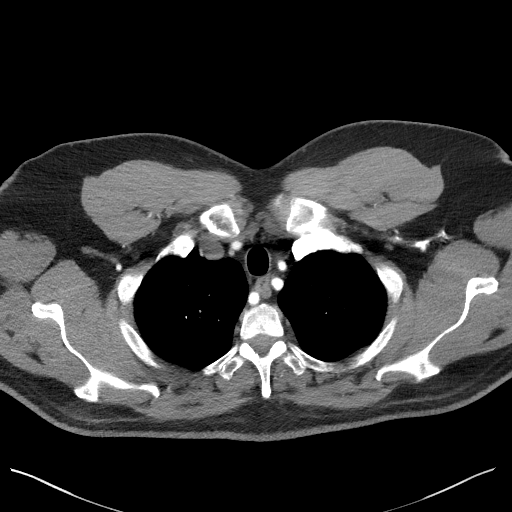
[im 102/110  lung]
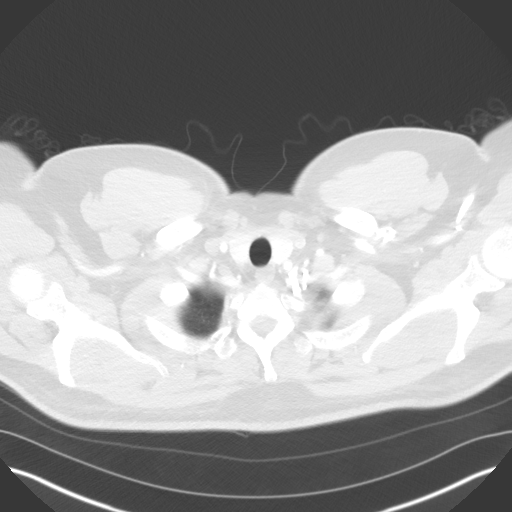

[Series 5: lung · axial · 0.78mm/px · z∈[-266,-218]mm · 2 of 110 slices shown]
[im 8/110  soft-tissue]
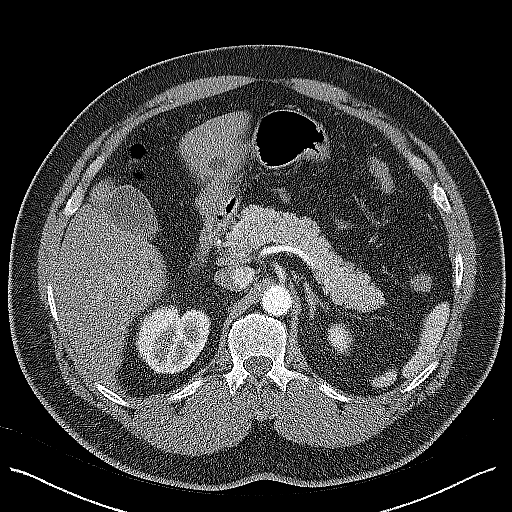
[im 24/110  soft-tissue]
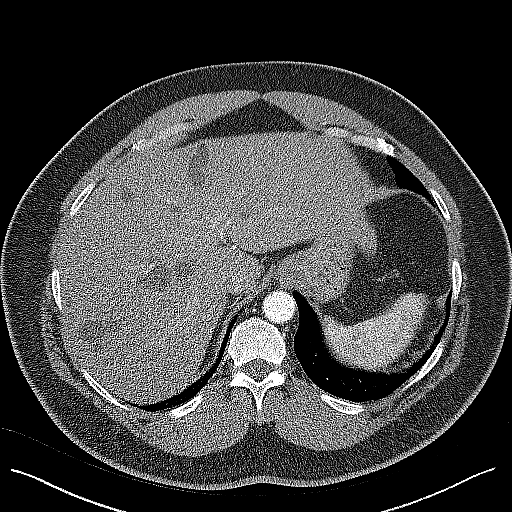

[Series 7: coronals · coronal · 0.65mm/px · 3 of 130 slices shown]
[im 33/130  soft-tissue]
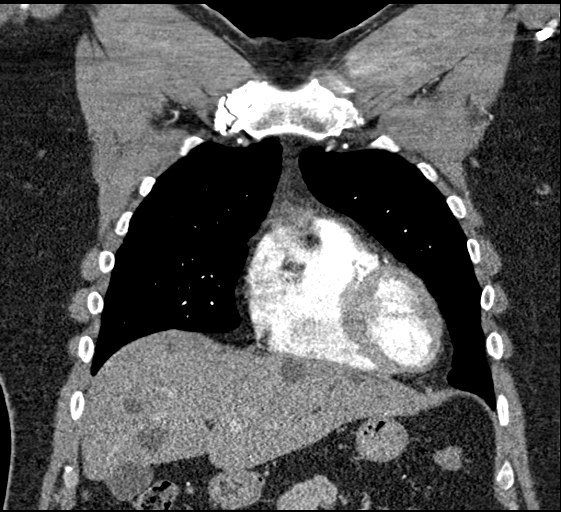
[im 65/130  soft-tissue]
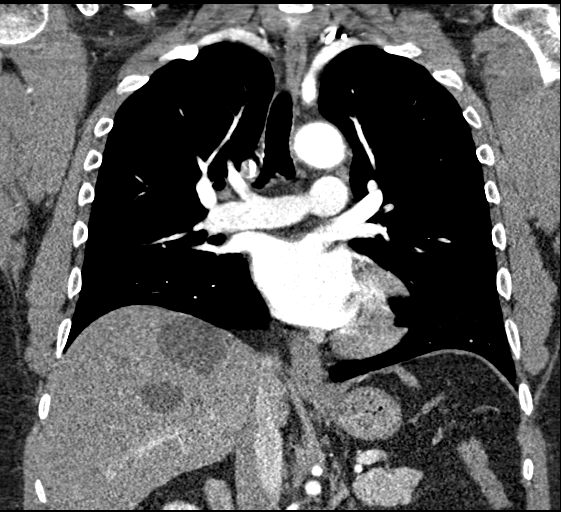
[im 97/130  soft-tissue]
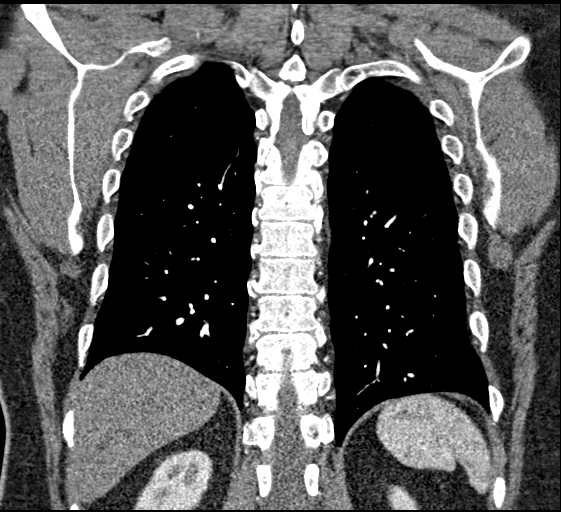

[18 of 46 positions shown; findings below may reference images not displayed]

FINDINGS: Cardiovascular:

Heart:

No cardiomegaly. No pericardial fluid/thickening. No significant
calcified coronary artery disease on this non gated study.

Aorta:

No significant aortic valve calcifications. Maximum estimated
diameter of the ascending aorta on this non gated study,
approximately 38 mm, not significantly changed from the prior.

No significant aortic arch atherosclerosis.

Aberrant origin of the right subclavian artery. No aneurysm at the
origin. Common origin of the common carotid arteries.

No significant atherosclerotic changes of the branch vessels.

Minimal atherosclerosis of the descending thoracic aorta.

Diameter at the hiatus 24 mm.

Pulmonary arteries:

Timing of the contrast bolus is not optimized for evaluation of
pulmonary artery filling defects. No filling defect within the left
atrium/left atrial appendage. Unremarkable diameter of the main
pulmonary artery.

Mediastinum/Nodes: No mediastinal adenopathy. Unremarkable
appearance of the thoracic esophagus.

Unremarkable appearance of the thoracic inlet.

Lungs/Pleura: Central airways are clear. No pleural effusion. No
confluent airspace disease.

No pneumothorax.

Upper Abdomen: No acute finding of the upper abdomen.
Redemonstration of multiple fluid density hepatic cystic structures
of varying sizes. There is similar size number and distribution,
none of which are completely characterized on the current CT though
most likely are benign.

Musculoskeletal: No acute displaced fracture. Degenerative changes
of the spine.

Review of the MIP images confirms the above findings.
IMPRESSION: Relatively unchanged size and configuration of the ascending aorta
on the current non gated CT angiogram, with the greatest estimated
diameter approximately 3.8 cm.

Additional ancillary findings as above.

## 2022-11-14 ENCOUNTER — Ambulatory Visit: Payer: Medicare Other | Admitting: Physician Assistant

## 2022-11-14 ENCOUNTER — Telehealth: Payer: Self-pay | Admitting: Family Medicine

## 2022-11-14 ENCOUNTER — Encounter: Payer: Self-pay | Admitting: Physician Assistant

## 2022-11-14 ENCOUNTER — Ambulatory Visit: Payer: Self-pay | Admitting: *Deleted

## 2022-11-14 VITALS — BP 130/79 | HR 68 | Ht 74.0 in | Wt 242.0 lb

## 2022-11-14 DIAGNOSIS — E1165 Type 2 diabetes mellitus with hyperglycemia: Secondary | ICD-10-CM | POA: Diagnosis not present

## 2022-11-14 DIAGNOSIS — M79605 Pain in left leg: Secondary | ICD-10-CM

## 2022-11-14 DIAGNOSIS — M79604 Pain in right leg: Secondary | ICD-10-CM

## 2022-11-14 DIAGNOSIS — M79671 Pain in right foot: Secondary | ICD-10-CM

## 2022-11-14 DIAGNOSIS — M79672 Pain in left foot: Secondary | ICD-10-CM

## 2022-11-14 DIAGNOSIS — I10 Essential (primary) hypertension: Secondary | ICD-10-CM

## 2022-11-14 DIAGNOSIS — L853 Xerosis cutis: Secondary | ICD-10-CM | POA: Diagnosis not present

## 2022-11-14 DIAGNOSIS — Z794 Long term (current) use of insulin: Secondary | ICD-10-CM

## 2022-11-14 DIAGNOSIS — K2950 Unspecified chronic gastritis without bleeding: Secondary | ICD-10-CM

## 2022-11-14 DIAGNOSIS — G8929 Other chronic pain: Secondary | ICD-10-CM

## 2022-11-14 NOTE — Telephone Encounter (Signed)
Spoke with patient . Verified name & DOB   Patient confirmed he is going to [facility] today.

## 2022-11-14 NOTE — Telephone Encounter (Signed)
Attempted to return his call.    Left a voicemail for him to call back so his symptoms could be discussed with a nurse.

## 2022-11-14 NOTE — Patient Instructions (Signed)
I started a referral for you to be seen by podiatry.  In the meantime I do encourage you to use frozen water bottle rolls, stretches for plantar fasciitis, supportive socks and supportive shoes.  Fleet feet is the name of the running shoe store.  For your dry feet, I do encourage you to exfoliate them and use a heavy emollient lotion.  Please let us know if there is any else we can do for you  Roney Jaffe, PA-C Physician Assistant [facility] [email or web address]   Plantar Fasciitis  Plantar fasciitis is a painful foot condition that affects the heel. It occurs when the band of tissue that connects the toes to the heel bone (plantar fascia) becomes irritated. This can happen as the result of exercising too much or doing other repetitive activities (overuse injury). Plantar fasciitis can cause mild irritation to severe pain that makes it difficult to walk or move. The pain is usually worse in the morning after sleeping, or after sitting or lying down for a period of time. Pain may also be worse after long periods of walking or standing. What are the causes? This condition may be caused by: Standing for long periods of time. Wearing shoes that do not have good arch support. Doing activities that put stress on joints (high-impact activities). This includes ballet and exercise that makes your heart beat faster (aerobic exercise), such as running. Being overweight. An abnormal way of walking (gait). Tight muscles in the back of your lower leg (calf). High arches in your feet or flat feet. Starting a new athletic activity. What are the signs or symptoms? The main symptom of this condition is heel pain. Pain may get worse after the following: Taking the first steps after a time of rest, especially in the morning after awakening, or after you have been sitting or lying down for a while. Long periods of standing still. Pain may decrease  after 30-45 minutes of activity, such as gentle walking. How is this diagnosed? This condition may be diagnosed based on your medical history, a physical exam, and your symptoms. Your health care provider will check for: A tender area on the bottom of your foot. A high arch in your foot or flat feet. Pain when you move your foot. Difficulty moving your foot. You may have imaging tests to confirm the diagnosis, such as: X-rays. Ultrasound. MRI. How is this treated? Treatment for plantar fasciitis depends on how severe your condition is. Treatment may include: Rest, ice, pressure (compression), and raising (elevating) the affected foot. This is called RICE therapy. Your health care provider may recommend RICE therapy along with over-the-counter pain medicines to manage your pain. Exercises to stretch your calves and your plantar fascia. A splint that holds your foot in a stretched, upward position while you sleep (night splint). Physical therapy to relieve symptoms and prevent problems in the future. Injections of steroid medicine (cortisone) to relieve pain and inflammation. Stimulating your plantar fascia with electrical impulses (extracorporeal shock wave therapy). This is usually the last treatment option before surgery. Surgery, if other treatments have not worked after 12 months. Follow these instructions at home: Managing pain, stiffness, and swelling  If directed, put ice on the painful area. To do this: Put ice in a plastic bag, or use a frozen bottle of water. Place a towel between your skin and the bag or bottle. Roll the bottom of your foot over the bag or bottle. Do this for 20 minutes, 2-3 times a  day. Wear athletic shoes that have air-sole or gel-sole cushions, or try soft shoe inserts that are designed for plantar fasciitis. Elevate your foot above the level of your heart while you are sitting or lying down. Activity Avoid activities that cause pain. Ask your health care  provider what activities are safe for you. Do physical therapy exercises and stretches as told by your health care provider. Try activities and forms of exercise that are easier on your joints (low impact). Examples include swimming, water aerobics, and biking. General instructions Take over-the-counter and prescription medicines only as told by your health care provider. Wear a night splint while sleeping, if told by your health care provider. Loosen the splint if your toes tingle, become numb, or turn cold and blue. Maintain a healthy weight, or work with your health care provider to lose weight as needed. Keep all follow-up visits. This is important. Contact a health care provider if you have: Symptoms that do not go away with home treatment. Pain that gets worse. Pain that affects your ability to move or do daily activities. Summary Plantar fasciitis is a painful foot condition that affects the heel. It occurs when the band of tissue that connects the toes to the heel bone (plantar fascia) becomes irritated. Heel pain is the main symptom of this condition. It may get worse after exercising too much or standing still for a long time. Treatment varies, but it usually starts with rest, ice, pressure (compression), and raising (elevating) the affected foot. This is called RICE therapy. Over-the-counter medicines can also be used to manage pain. This information is not intended to replace advice given to you by your health care provider. Make sure you discuss any questions you have with your health care provider. Document Revised: 09/29/2019 Document Reviewed: 09/29/2019 Elsevier Patient Education  2023 [employer].

## 2022-11-14 NOTE — Telephone Encounter (Signed)
Copied from CRM [id number]. Topic: Referral - Request for Referral >> Nov 14, 2022 12:56 PM De Blanch wrote: Has patient seen PCP for this complaint? No *If NO, is insurance requiring patient see PCP for this issue before PCP can refer them?  Referral for which specialty: podiatrist,gastroenterologist  Preferred provider/office:  Reason for referral:   Pt is calling to request a referral to see a podiatrist having pain at the bottom of his feet and to see a new gastroenterologist. The provider he is seeing will be out for a while. Requesting a referral be sent to [facility]. Elnoria Howard, MD [facility], [place]  Please advise.

## 2022-11-14 NOTE — Telephone Encounter (Signed)
  Chief Complaint: foot pain- patient requesting referral to podiatry  Symptoms: bilateral foot pain- ball of feet- left is worse Frequency: 2 weeks Pertinent Negatives: Patient denies swelling Disposition: [] ED /[] Urgent Care (no appt availability in office) / [] Appointment(In office/virtual)/ []  [facility]/ [] Home Care/ [] Refused Recommended Disposition /[x] [facility]/ []  Follow-up with PCP Additional Notes: Patient advised needs to be seen in the meantime for possible treatment for symptoms- even before referral because that may be some time.  Offered appointment tomorrow am- but patient has to have afternoon appointment- after 3:30- Will send message to see if he can be scheduled next week. Patient advised of location of mobile unit- he states he uses frequently and will try to go today.  Reason for Disposition . [1] MODERATE pain (e.g., interferes with normal activities, limping) AND [2] present > 3 days  Answer Assessment - Initial Assessment Questions 1. ONSET: "When did the pain start?"      Ongoing 2 weeks 2. LOCATION: "Where is the pain located?"       bottom of foot- ball of feet- both feet- left is worse 3. PAIN: "How bad is the pain?"    (Scale 1-10; or mild, moderate, severe)  - MILD (1-3): doesn't interfere with normal activities.   - MODERATE (4-7): interferes with normal activities (e.g., work or school) or awakens from sleep, limping.   - SEVERE (8-10): excruciating pain, unable to do any normal activities, unable to walk.      Mild/moderate- walking helps- but has throbbing pain when sits 4. WORK OR EXERCISE: "Has there been any recent work or exercise that involved this part of the body?"      none 5. CAUSE: "What do you think is causing the foot pain?"     No idea- patient thinks diabetes or circulation  6. OTHER SYMPTOMS: "Do you have any other symptoms?" (e.g., leg pain, rash, fever, numbness)     Ring around side of foot- present 1  year  Protocols used: Foot Pain-A-AH

## 2022-11-14 NOTE — Telephone Encounter (Signed)
Routing to PCP for review.

## 2022-11-14 NOTE — Progress Notes (Unsigned)
Established Patient Office Visit  Subjective   Patient ID: Jose Bishop, male    DOB: [birth year]  Age: 57 y.o. MRN: [id number]  Chief Complaint  Patient presents with   Referral    Podiatry     States that he has been experiencing in both feet, at the ball of his foot, across the bottom, and at the heel.  States that the left is worse than the right.  States that the pain has been ongoing for the past 2 weeks.  States the pain is worse in the morning when he first stands up, and hurts while he is walking.  States that he has been working on improving his overall health and is walking 2 to 3 miles a day for exercise.  States that he does wear a supportive shoe, does stretching, has been using ice on his shins with little relief.  States that he has also used ibuprofen with little relief.  States that he also has been experiencing dry peeling skin and brown discoloration on his feet, states that he has used [employer] moisturizing lotion without relief.    Request referral for vascular studies, states that he is concerned due to his history of hypertension and diabetes and recent loss of hair on his lower legs.      Past Medical History:  Diagnosis Date   AAA (abdominal aortic aneurysm) (HCC)    Anxiety    Bradycardia    Bradycardia    Difficulty sleeping    Essential hypertension    Family history of stroke    Headache    History of transient ischemic attack (TIA)    15 YRS AGO   Hyperlipidemia    Impotence of organic origin    Liver cyst    Memory loss    Osteoarthritis of acromioclavicular joint    Prostate cancer (HCC)    PVC (premature ventricular contraction)    Type 2 diabetes mellitus with hyperglycemia (HCC)    Social History   Socioeconomic History   Marital status: Married    Spouse name: Not on file   Number of children: 5   Years of education: Not on file   Highest education level: Associate degree: occupational, Scientist, product/process development, or vocational program   Occupational History   Occupation: part time  Tobacco Use   Smoking status: Never    Passive exposure: Never   Smokeless tobacco: Never  Vaping Use   Vaping Use: Never used  Substance and Sexual Activity   Alcohol use: Yes    Alcohol/week: 0.0 standard drinks of alcohol    Comment: OCCASIONAL   Drug use: No   Sexual activity: Yes    Partners: Female  Other Topics Concern   Not on file  Social History Narrative   Not on file   Social Determinants of Health   Financial Resource Strain: Low Risk  (12/01/2021)   Overall Financial Resource Strain (CARDIA)    Difficulty of Paying Living Expenses: Not very hard  Food Insecurity: No Food Insecurity (12/01/2021)   Hunger Vital Sign    Worried About Running Out of Food in the Last Year: Never true    Ran Out of Food in the Last Year: Never true  Transportation Needs: No Transportation Needs (12/01/2021)   PRAPARE - Administrator, Civil Service (Medical): No    Lack of Transportation (Non-Medical): No  Physical Activity: Insufficiently Active (12/01/2021)   Exercise Vital Sign    Days of Exercise per Week: 3  days    Minutes of Exercise per Session: 20 min  Stress: Stress Concern Present (12/01/2021)   [employer] of Occupational Health - Occupational Stress Questionnaire    Feeling of Stress : To some extent  Social Connections: Moderately Integrated (12/01/2021)   Social Connection and Isolation Panel [NHANES]    Frequency of Communication with Friends and Family: Once a week    Frequency of Social Gatherings with Friends and Family: Once a week    Attends Religious Services: More than 4 times per year    Active Member of [employer] or Organizations: Yes    Attends Engineer, structural: More than 4 times per year    Marital Status: Married  Catering manager Violence: Not At Risk (12/01/2021)   Humiliation, Afraid, Rape, and Kick questionnaire    Fear of Current or Ex-Partner: No    Emotionally Abused: No     Physically Abused: No    Sexually Abused: No   Family History  Problem Relation Age of Onset   Anuerysm Mother    Stroke Mother    Hypertension Father    Peripheral vascular disease Father    Stroke Father    Colon cancer Father        73's   Diabetes Sister    Heart murmur Sister    Heart murmur Son    Stomach cancer Neg Hx    Rectal cancer Neg Hx    Throat cancer Neg Hx    Esophageal cancer Neg Hx    Pancreatic cancer Neg Hx    Allergies  Allergen Reactions   Bee Venom Anaphylaxis   Dapagliflozin Pro-Metformin Er    Lipitor [Atorvastatin]     FATIGUED NAUSEA BRAIN FOG    Review of Systems  Constitutional: Negative.   HENT: Negative.    Eyes: Negative.   Respiratory:  Negative for shortness of breath.   Cardiovascular:  Negative for chest pain.  Gastrointestinal: Negative.   Genitourinary: Negative.   Musculoskeletal: Negative.   Skin: Negative.  Negative for itching.  Neurological: Negative.   Endo/Heme/Allergies: Negative.   Psychiatric/Behavioral: Negative.        Objective:     BP 130/79 (BP Location: Left Arm, Patient Position: Sitting, Cuff Size: Large)   Pulse 68   Ht 6\' 2"  (1.88 m)   Wt 242 lb (109.8 kg)   SpO2 98%   BMI 31.07 kg/m  BP Readings from Last 3 Encounters:  11/14/22 130/79  10/19/22 126/74  09/21/22 122/80   Wt Readings from Last 3 Encounters:  11/14/22 242 lb (109.8 kg)  10/19/22 247 lb 6 oz (112.2 kg)  09/21/22 249 lb (112.9 kg)      Physical Exam Vitals and nursing note reviewed.  Constitutional:      Appearance: Normal appearance.  HENT:     Head: Normocephalic and atraumatic.     Right Ear: External ear normal.     Left Ear: External ear normal.     Nose: Nose normal.     Mouth/Throat:     Mouth: Mucous membranes are moist.     Pharynx: Oropharynx is clear.  Eyes:     Extraocular Movements: Extraocular movements intact.     Conjunctiva/sclera: Conjunctivae normal.     Pupils: Pupils are equal, round, and  reactive to light.  Cardiovascular:     Rate and Rhythm: Normal rate and regular rhythm.     Pulses: Normal pulses.          Dorsalis pedis pulses  are 2+ on the right side and 2+ on the left side.       Posterior tibial pulses are 2+ on the right side and 2+ on the left side.     Heart sounds: Normal heart sounds.  Pulmonary:     Effort: Pulmonary effort is normal.     Breath sounds: Normal breath sounds.  Musculoskeletal:        General: Normal range of motion.     Cervical back: Normal range of motion and neck supple.       Feet:  Feet:     Right foot:     Skin integrity: Callus and dry skin present. No skin breakdown or erythema.     Toenail Condition: Fungal disease present.    Left foot:     Skin integrity: Callus and dry skin present. No skin breakdown or erythema.     Toenail Condition: Fungal disease present.    Comments: Dry peeling skin noted on those areas, line of darker skin noted near top line of peeling skin.  Does not appear to be tinea pedis Skin:    General: Skin is warm and dry.  Neurological:     General: No focal deficit present.     Mental Status: He is oriented to person, place, and time.  Psychiatric:        Mood and Affect: Mood normal.        Behavior: Behavior normal.        Thought Content: Thought content normal.        Judgment: Judgment normal.        Assessment & Plan:   Problem List Items Addressed This Visit       Cardiovascular and Mediastinum   Benign essential HTN   Relevant Orders   Ambulatory referral to Vascular Surgery     Endocrine   Type 2 diabetes mellitus with hyperglycemia, with long-term current use of insulin (HCC)   Relevant Orders   Ambulatory referral to Vascular Surgery   Other Visit Diagnoses     Foot pain, bilateral    -  Primary   Relevant Orders   Ambulatory referral to Podiatry   Leg pain, bilateral       Relevant Orders   Ambulatory referral to Vascular Surgery   Dry skin          1. Foot pain,  bilateral Plantar fasciitis versus neuropathy.  Patient education given on supportive care.  Red flags given for prompt reevaluation.  Patient does request follow-up with podiatry. - Ambulatory referral to Podiatry  2. Leg pain, bilateral  - Ambulatory referral to Vascular Surgery  3. Dry skin Patient education given on supportive care  4. Type 2 diabetes mellitus with hyperglycemia, with long-term current use of insulin (HCC)  - Ambulatory referral to Vascular Surgery  5. Benign essential HTN  - Ambulatory referral to Vascular Surgery   I have reviewed the patient's medical history (PMH, PSH, Social History, Family History, Medications, and allergies) , and have been updated if relevant. I spent 30 minutes reviewing chart and  face to face time with patient.    Return if symptoms worsen or fail to improve.    Kasandra Knudsen Mayers, PA-C

## 2022-11-14 NOTE — Telephone Encounter (Signed)
Message from [facility] sent at 11/14/2022  1:02 PM EDT  Summary: having pain on the bottom of his feet.   Pt is calling to request a referral to see a podiatrist. He stated that something is going on with circulation on his feet and legs, and he is having pain on the bottom of his feet. He has no hair on his legs. Stated, going on for about two weeks.   FYI to NT A referral request has been sent to the office.   Pt seeking clinical advice.          Call History   Type Contact Phone/Fax User  11/14/2022 12:57 PM EDT Phone (Incoming) Jose Bishop, Jose Bishop (Self) [phone] Judie Petit) McGill, Darlina Rumpf

## 2022-11-14 NOTE — Telephone Encounter (Signed)
2nd attempt to return his call.   Voicemail left to call back.

## 2022-11-15 ENCOUNTER — Encounter: Payer: Self-pay | Admitting: Physician Assistant

## 2022-11-15 NOTE — Telephone Encounter (Signed)
Referral has been placed. 

## 2022-11-15 NOTE — Addendum Note (Signed)
Addended by: Hoy Register on: 11/15/2022 05:23 PM   Modules accepted: Orders

## 2022-11-21 ENCOUNTER — Telehealth: Payer: Self-pay

## 2022-11-21 NOTE — Telephone Encounter (Signed)
Copied from CRM [id number]. Topic: General - Other >> Nov 21, 2022  4:40 PM Turkey B wrote: Reason for CRM: call from Dr Haywood Pao office, for referral, states they are unable to see pt because he was seen at [facility] p[facility] aprl [phone]

## 2022-11-22 ENCOUNTER — Telehealth: Payer: Self-pay | Admitting: Nurse Practitioner

## 2022-11-22 NOTE — Telephone Encounter (Signed)
Contacted pt & pt verbazlized understanding to complete lab test within the next week.

## 2022-11-22 NOTE — Telephone Encounter (Signed)
DD, pls contact patient and kindly remind him that he was due to return to our lab for a repeat lipase level. Pls have him complete this lab test within the next week. THX.

## 2022-11-23 ENCOUNTER — Other Ambulatory Visit (INDEPENDENT_AMBULATORY_CARE_PROVIDER_SITE_OTHER): Payer: Medicare Other

## 2022-11-23 DIAGNOSIS — E1165 Type 2 diabetes mellitus with hyperglycemia: Secondary | ICD-10-CM

## 2022-11-23 DIAGNOSIS — R1012 Left upper quadrant pain: Secondary | ICD-10-CM | POA: Diagnosis not present

## 2022-11-23 DIAGNOSIS — Z794 Long term (current) use of insulin: Secondary | ICD-10-CM

## 2022-11-23 LAB — LIPASE: Lipase: 37 U/L (ref 11.0–59.0)

## 2022-11-24 NOTE — Telephone Encounter (Signed)
Pt was called and informed of referral being denied and to call [facility] to schedule an appointment.

## 2022-11-27 ENCOUNTER — Ambulatory Visit: Payer: Medicare Other | Admitting: Podiatry

## 2022-11-27 ENCOUNTER — Ambulatory Visit (INDEPENDENT_AMBULATORY_CARE_PROVIDER_SITE_OTHER): Payer: Medicare Other

## 2022-11-27 DIAGNOSIS — M7742 Metatarsalgia, left foot: Secondary | ICD-10-CM

## 2022-11-27 DIAGNOSIS — M79672 Pain in left foot: Secondary | ICD-10-CM

## 2022-11-27 DIAGNOSIS — M79671 Pain in right foot: Secondary | ICD-10-CM

## 2022-11-27 DIAGNOSIS — M7741 Metatarsalgia, right foot: Secondary | ICD-10-CM | POA: Diagnosis not present

## 2022-11-27 MED ORDER — CLOTRIMAZOLE-BETAMETHASONE 1-0.05 % EX CREA: 1.0000 | TOPICAL_CREAM | Freq: Every day | CUTANEOUS | 2 refills | Status: DC

## 2022-11-27 NOTE — Progress Notes (Signed)
Chief Complaint  Patient presents with   Peripheral Neuropathy    Patient came in today for bilateral foot pain, forefoot and toes,  started month ago, pin needles, throbbing,  Diabetic A1c- 7.8 BG- 112,     HPI: 57 y.o. male PMHx T2DM, last A1c 7.8, presenting today as a new patient for evaluation of pain and tenderness associated to the bilateral feet ongoing about a month ago.  Patient is concerned for possible neuropathy.  Patient states that he experiences the majority of his pain in the mornings when he first gets out of bed.  After the first 1-2 minutes the pain resolves and he is able to function throughout the day without pain.  Presenting for further treatment and evaluation  Past Medical History:  Diagnosis Date   AAA (abdominal aortic aneurysm) (HCC)    Anxiety    Bradycardia    Bradycardia    Difficulty sleeping    Essential hypertension    Family history of stroke    Headache    History of transient ischemic attack (TIA)    15 YRS AGO   Hyperlipidemia    Impotence of organic origin    Liver cyst    Memory loss    Osteoarthritis of acromioclavicular joint    Prostate cancer (HCC)    PVC (premature ventricular contraction)    Type 2 diabetes mellitus with hyperglycemia (HCC)     Past Surgical History:  Procedure Laterality Date   HERNIA REPAIR     X2 ( 1 ING HERNIA / 1 UMBILICAL HERNIA )   LYMPHADENECTOMY Bilateral 01/04/2015   Procedure: BILATERAL LYMPHADENECTOMY;  Surgeon: Heloise Purpura, MD;  Location: [facility];  Service: Urology;  Laterality: Bilateral;   MASS EXCISION     L UPPER ARM   PENILE PROSTHESIS IMPLANT  03/18/2019   ROBOT ASSISTED LAPAROSCOPIC RADICAL PROSTATECTOMY N/A 01/04/2015   Procedure: ROBOTIC ASSISTED LAPAROSCOPIC RADICAL PROSTATECTOMY LEVEL 2;  Surgeon: Heloise Purpura, MD;  Location: [facility];  Service: Urology;  Laterality: N/A;   ROTATOR CUFF REPAIR Bilateral    SURGERY SCROTAL / TESTICULAR      Allergies  Allergen Reactions   Bee Venom  Anaphylaxis   Dapagliflozin Pro-Metformin Er    Lipitor [Atorvastatin]     FATIGUED NAUSEA BRAIN FOG     Physical Exam: General: The patient is alert and oriented x3 in no acute distress.  Dermatology: Skin is warm, dry and supple bilateral lower extremities.   Vascular: Palpable pedal pulses bilaterally. Capillary refill within normal limits.  No appreciable edema.  No erythema.  Neurological: Grossly intact via light touch  Musculoskeletal Exam: No pedal deformities noted.  Today there is no tenderness or pain throughout palpation of the forefoot.  The majority the patient's pain appears to be in the mornings and alleviates after the first 1-2 minutes  Radiographic Exam B/L feet 11/27/2022:  Normal osseous mineralization. Joint spaces preserved.  No fractures or osseous irregularities noted.  Plantar and posterior heel spur noted bilateral  Assessment/Plan of Care: 1.  Metatarsalgia bilateral 2.  Plantar fasciitis bilateral 3.  Diabetes mellitus; uncontrolled -Patient evaluated.  X-rays reviewed.  Stressed the importance of closely working with his PCP for diabetes management -Reassured the patient that I do not believe he is experiencing neuropathy to the feet.  This is generalized morning stiffness and pain first thing in the morning which alleviates after the first 1-2 minutes when getting out of bed.  He has no other pain or symptoms throughout the  day -Parsnip insoles were provided for the patient to offload pressure from the forefoot and alleviate pain -Advised against going barefoot around the house -Return to clinic as needed    Felecia Shelling, DPM [facility]  Dr. Felecia Shelling, DPM    [address] [place], [place] [address]                Office [phone]  Fax [phone]

## 2022-11-27 NOTE — Patient Instructions (Addendum)
OOFOS sandals and slides for around the house. Available on [employer]

## 2022-12-05 NOTE — Patient Instructions (Incomplete)
Mr. Huizar , Thank you for taking time to come for your Medicare Wellness Visit. I appreciate your ongoing commitment to your health goals. Please review the following plan we discussed and let me know if I can assist you in the future.   These are the goals we discussed:  Goals      Blood Pressure < 130/80     LDL CALC < 70        This is a list of the screening recommended for you and due dates:  Health Maintenance  Topic Date Due   Zoster (Shingles) Vaccine (1 of 2) Never done   COVID-19 Vaccine (3 - Pfizer risk series) 10/18/2019   Eye exam for diabetics  10/26/2022   Medicare Annual Wellness Visit  12/02/2022   Flu Shot  01/25/2023   Hemoglobin A1C  03/24/2023   Yearly kidney health urinalysis for diabetes  06/13/2023   Complete foot exam   06/13/2023   Yearly kidney function blood test for diabetes  10/19/2023   Colon Cancer Screening  10/26/2026   DTaP/Tdap/Td vaccine (2 - Td or Tdap) 02/23/2032   Hepatitis C Screening  Completed   HIV Screening  Completed   HPV Vaccine  Aged Out    Advanced directives: Information on Advanced Care Planning can be found at [facility] of [facility] Advance Health Care Directives Advance Health Care Directives ([email or web address] Please bring a copy of your health care power of attorney and living will to the office to be added to your chart at your convenience.  Conditions/risks identified: Aim for 30 minutes of exercise or brisk walking, 6-8 glasses of water, and 5 servings of fruits and vegetables each day.  Next appointment: Follow up in one year for your annual wellness visit   Preventive Care 40-64 Years, Male Preventive care refers to lifestyle choices and visits with your health care provider that can promote health and wellness. What does preventive care include? A yearly physical exam. This is also called an annual well check. Dental exams once or twice a year. Routine eye exams. Ask your health care provider how often you  should have your eyes checked. Personal lifestyle choices, including: Daily care of your teeth and gums. Regular physical activity. Eating a healthy diet. Avoiding tobacco and drug use. Limiting alcohol use. Practicing safe sex. Taking low-dose aspirin every day starting at age 16. What happens during an annual well check? The services and screenings done by your health care provider during your annual well check will depend on your age, overall health, lifestyle risk factors, and family history of disease. Counseling  Your health care provider may ask you questions about your: Alcohol use. Tobacco use. Drug use. Emotional well-being. Home and relationship well-being. Sexual activity. Eating habits. Work and work Astronomer. Screening  You may have the following tests or measurements: Height, weight, and BMI. Blood pressure. Lipid and cholesterol levels. These may be checked every 5 years, or more frequently if you are over 35 years old. Skin check. Lung cancer screening. You may have this screening every year starting at age 54 if you have a 30-pack-year history of smoking and currently smoke or have quit within the past 15 years. Fecal occult blood test (FOBT) of the stool. You may have this test every year starting at age 6. Flexible sigmoidoscopy or colonoscopy. You may have a sigmoidoscopy every 5 years or a colonoscopy every 10 years starting at age 87. Prostate cancer screening. Recommendations will vary depending on your family  history and other risks. Hepatitis C blood test. Hepatitis B blood test. Sexually transmitted disease (STD) testing. Diabetes screening. This is done by checking your blood sugar (glucose) after you have not eaten for a while (fasting). You may have this done every 1-3 years. Discuss your test results, treatment options, and if necessary, the need for more tests with your health care provider. Vaccines  Your health care provider may recommend  certain vaccines, such as: Influenza vaccine. This is recommended every year. Tetanus, diphtheria, and acellular pertussis (Tdap, Td) vaccine. You may need a Td booster every 10 years. Zoster vaccine. You may need this after age 76. Pneumococcal 13-valent conjugate (PCV13) vaccine. You may need this if you have certain conditions and have not been vaccinated. Pneumococcal polysaccharide (PPSV23) vaccine. You may need one or two doses if you smoke cigarettes or if you have certain conditions. Talk to your health care provider about which screenings and vaccines you need and how often you need them. This information is not intended to replace advice given to you by your health care provider. Make sure you discuss any questions you have with your health care provider. Document Released: 07/09/2015 Document Revised: 03/01/2016 Document Reviewed: 04/13/2015 Elsevier Interactive Patient Education  2017 [employer].  Fall Prevention in the Home Falls can cause injuries. They can happen to people of all ages. There are many things you can do to make your home safe and to help prevent falls. What can I do on the outside of my home? Regularly fix the edges of walkways and driveways and fix any cracks. Remove anything that might make you trip as you walk through a door, such as a raised step or threshold. Trim any bushes or trees on the path to your home. Use bright outdoor lighting. Clear any walking paths of anything that might make someone trip, such as rocks or tools. Regularly check to see if handrails are loose or broken. Make sure that both sides of any steps have handrails. Any raised decks and porches should have guardrails on the edges. Have any leaves, snow, or ice cleared regularly. Use sand or salt on walking paths during winter. Clean up any spills in your garage right away. This includes oil or grease spills. What can I do in the bathroom? Use night lights. Install grab bars by the  toilet and in the tub and shower. Do not use towel bars as grab bars. Use non-skid mats or decals in the tub or shower. If you need to sit down in the shower, use a plastic, non-slip stool. Keep the floor dry. Clean up any water that spills on the floor as soon as it happens. Remove soap buildup in the tub or shower regularly. Attach bath mats securely with double-sided non-slip rug tape. Do not have throw rugs and other things on the floor that can make you trip. What can I do in the bedroom? Use night lights. Make sure that you have a light by your bed that is easy to reach. Do not use any sheets or blankets that are too big for your bed. They should not hang down onto the floor. Have a firm chair that has side arms. You can use this for support while you get dressed. Do not have throw rugs and other things on the floor that can make you trip. What can I do in the kitchen? Clean up any spills right away. Avoid walking on wet floors. Keep items that you use a lot in  easy-to-reach places. If you need to reach something above you, use a strong step stool that has a grab bar. Keep electrical cords out of the way. Do not use floor polish or wax that makes floors slippery. If you must use wax, use non-skid floor wax. Do not have throw rugs and other things on the floor that can make you trip. What can I do with my stairs? Do not leave any items on the stairs. Make sure that there are handrails on both sides of the stairs and use them. Fix handrails that are broken or loose. Make sure that handrails are as long as the stairways. Check any carpeting to make sure that it is firmly attached to the stairs. Fix any carpet that is loose or worn. Avoid having throw rugs at the top or bottom of the stairs. If you do have throw rugs, attach them to the floor with carpet tape. Make sure that you have a light switch at the top of the stairs and the bottom of the stairs. If you do not have them, ask someone  to add them for you. What else can I do to help prevent falls? Wear shoes that: Do not have high heels. Have rubber bottoms. Are comfortable and fit you well. Are closed at the toe. Do not wear sandals. If you use a stepladder: Make sure that it is fully opened. Do not climb a closed stepladder. Make sure that both sides of the stepladder are locked into place. Ask someone to hold it for you, if possible. Clearly mark and make sure that you can see: Any grab bars or handrails. First and last steps. Where the edge of each step is. Use tools that help you move around (mobility aids) if they are needed. These include: Canes. Walkers. Scooters. Crutches. Turn on the lights when you go into a dark area. Replace any light bulbs as soon as they burn out. Set up your furniture so you have a clear path. Avoid moving your furniture around. If any of your floors are uneven, fix them. If there are any pets around you, be aware of where they are. Review your medicines with your doctor. Some medicines can make you feel dizzy. This can increase your chance of falling. Ask your doctor what other things that you can do to help prevent falls. This information is not intended to replace advice given to you by your health care provider. Make sure you discuss any questions you have with your health care provider. Document Released: 04/08/2009 Document Revised: 11/18/2015 Document Reviewed: 07/17/2014 Elsevier Interactive Patient Education  2017 [employer].

## 2022-12-05 NOTE — Progress Notes (Unsigned)
Subjective:   Jose Bishop is a 57 y.o. male who presents for Medicare Annual/Subsequent preventive examination.  Review of Systems    ***       Objective:    There were no vitals filed for this visit. There is no height or weight on file to calculate BMI.     07/31/2022   11:07 AM 12/01/2021   12:15 PM 04/30/2019    2:24 PM 01/26/2016    9:42 AM 01/04/2015    4:23 PM 01/04/2015    9:52 AM 12/30/2014    2:19 PM  Advanced Directives  Does Patient Have a Medical Advance Directive? Yes Yes No No No  No  Type of Advance Directive  Living will       Does patient want to make changes to medical advance directive? No - Patient declined        Would patient like information on creating a medical advance directive?   No - Patient declined No - patient declined information No - patient declined information No - patient declined information No - patient declined information    Current Medications (verified) Outpatient Encounter Medications as of 12/06/2022  Medication Sig   APPLE CIDER VINEGAR PO Take by mouth.   BLACK CURRANT SEED OIL PO Take by mouth.   cetirizine (ZYRTEC) 10 MG tablet Take 1 tablet (10 mg total) by mouth daily.   chlorthalidone (HYGROTON) 25 MG tablet TAKE 1 TABLET(25 MG) BY MOUTH DAILY   CINNAMON PO Take by mouth.   clotrimazole-betamethasone (LOTRISONE) cream Apply 1 Application topically daily.   Continuous Blood Gluc Sensor (FREESTYLE LIBRE 3 SENSOR) MISC 1 each by Does not apply route every 14 (fourteen) days.   Evolocumab with Infusor (REPATHA PUSHTRONEX SYSTEM) 420 MG/3.5ML SOCT Inject 420 mg into the skin every 30 (thirty) days.   ezetimibe (ZETIA) 10 MG tablet TAKE 1 TABLET(10 MG) BY MOUTH DAILY   fluticasone (FLONASE) 50 MCG/ACT nasal spray Place 2 sprays into both nostrils daily.   gabapentin (NEURONTIN) 300 MG capsule TAKE 1 CAPSULE(300 MG) BY MOUTH AT BEDTIME   Ginger, Zingiber officinalis, (GINGER PO) Take by mouth.   glucose blood (ONETOUCH VERIO) test  strip 1 each by Other route daily. And lancets 1/day   ibuprofen (ADVIL) 600 MG tablet Take 1 tablet (600 mg total) by mouth every 8 (eight) hours as needed (pain).   Insulin Pen Needle 32G X 4 MM MISC Use 1x a day   latanoprost (XALATAN) 0.005 % ophthalmic solution Place 1 drop into both eyes at bedtime.   lisinopril (ZESTRIL) 20 MG tablet TAKE 1 TABLET(30 MG) BY MOUTH DAILY   metFORMIN (GLUCOPHAGE) 500 MG tablet Take by mouth 2 (two) times daily with a meal. Take 1 tablet in the morning and 1 tablet at noon, and 2 tablets at bedtime   omeprazole (PRILOSEC) 40 MG capsule Take 1 capsule (40 mg total) by mouth in the morning.   OVER THE COUNTER MEDICATION BEET SUPPLEMENT   TURMERIC PO Take by mouth.   No facility-administered encounter medications on file as of 12/06/2022.    Allergies (verified) Bee venom, Dapagliflozin pro-metformin er, and Lipitor [atorvastatin]   History: Past Medical History:  Diagnosis Date   AAA (abdominal aortic aneurysm) (HCC)    Anxiety    Bradycardia    Bradycardia    Difficulty sleeping    Essential hypertension    Family history of stroke    Headache    History of transient ischemic attack (TIA)  15 YRS AGO   Hyperlipidemia    Impotence of organic origin    Liver cyst    Memory loss    Osteoarthritis of acromioclavicular joint    Prostate cancer (HCC)    PVC (premature ventricular contraction)    Type 2 diabetes mellitus with hyperglycemia (HCC)    Past Surgical History:  Procedure Laterality Date   HERNIA REPAIR     X2 ( 1 ING HERNIA / 1 UMBILICAL HERNIA )   LYMPHADENECTOMY Bilateral 01/04/2015   Procedure: BILATERAL LYMPHADENECTOMY;  Surgeon: Heloise Purpura, MD;  Location: [facility];  Service: Urology;  Laterality: Bilateral;   MASS EXCISION     L UPPER ARM   PENILE PROSTHESIS IMPLANT  03/18/2019   ROBOT ASSISTED LAPAROSCOPIC RADICAL PROSTATECTOMY N/A 01/04/2015   Procedure: ROBOTIC ASSISTED LAPAROSCOPIC RADICAL PROSTATECTOMY LEVEL 2;   Surgeon: Heloise Purpura, MD;  Location: [facility];  Service: Urology;  Laterality: N/A;   ROTATOR CUFF REPAIR Bilateral    SURGERY SCROTAL / TESTICULAR     Family History  Problem Relation Age of Onset   Anuerysm Mother    Stroke Mother    Hypertension Father    Peripheral vascular disease Father    Stroke Father    Colon cancer Father        7's   Diabetes Sister    Heart murmur Sister    Heart murmur Son    Stomach cancer Neg Hx    Rectal cancer Neg Hx    Throat cancer Neg Hx    Esophageal cancer Neg Hx    Pancreatic cancer Neg Hx    Social History   Socioeconomic History   Marital status: Married    Spouse name: Not on file   Number of children: 5   Years of education: Not on file   Highest education level: Associate degree: occupational, Scientist, product/process development, or vocational program  Occupational History   Occupation: part time  Tobacco Use   Smoking status: Never    Passive exposure: Never   Smokeless tobacco: Never  Vaping Use   Vaping Use: Never used  Substance and Sexual Activity   Alcohol use: Yes    Alcohol/week: 0.0 standard drinks of alcohol    Comment: OCCASIONAL   Drug use: No   Sexual activity: Yes    Partners: Female  Other Topics Concern   Not on file  Social History Narrative   Not on file   Social Determinants of Health   Financial Resource Strain: Low Risk  (12/01/2021)   Overall Financial Resource Strain (CARDIA)    Difficulty of Paying Living Expenses: Not very hard  Food Insecurity: No Food Insecurity (12/01/2021)   Hunger Vital Sign    Worried About Running Out of Food in the Last Year: Never true    Ran Out of Food in the Last Year: Never true  Transportation Needs: No Transportation Needs (12/01/2021)   PRAPARE - Administrator, Civil Service (Medical): No    Lack of Transportation (Non-Medical): No  Physical Activity: Insufficiently Active (12/01/2021)   Exercise Vital Sign    Days of Exercise per Week: 3 days    Minutes of Exercise per  Session: 20 min  Stress: Stress Concern Present (12/01/2021)   [employer] of Occupational Health - Occupational Stress Questionnaire    Feeling of Stress : To some extent  Social Connections: Moderately Integrated (12/01/2021)   Social Connection and Isolation Panel [NHANES]    Frequency of Communication with Friends and Family:  Once a week    Frequency of Social Gatherings with Friends and Family: Once a week    Attends Religious Services: More than 4 times per year    Active Member of [employer] or Organizations: Yes    Attends Engineer, structural: More than 4 times per year    Marital Status: Married    Tobacco Counseling Counseling given: Not Answered   Clinical Intake:                 Diabetic?Yes   Nutrition Risk Assessment:  Has the patient had any N/V/D within the last 2 months?  {YES/NO:21197} Does the patient have any non-healing wounds?  {YES/NO:21197} Has the patient had any unintentional weight loss or weight gain?  {YES/NO:21197}  Diabetes:  Is the patient diabetic?  {YES/NO:21197} If diabetic, was a CBG obtained today?  {YES/NO:21197} Did the patient bring in their glucometer from home?  {YES/NO:21197} How often do you monitor your CBG's? ***.   Financial Strains and Diabetes Management:  Are you having any financial strains with the device, your supplies or your medication? {YES/NO:21197}.  Does the patient want to be seen by Chronic Care Management for management of their diabetes?  {YES/NO:21197} Would the patient like to be referred to a Nutritionist or for Diabetic Management?  {YES/NO:21197}  Diabetic Exams:  {Diabetic Eye Exam:2101801} Diabetic Foot Exam: Completed 06/12/22          Activities of Daily Living     No data to display          Patient Care Team: Hoy Register, MD as PCP - General (Family Medicine) Runell Gess, MD as PCP - Cardiology (Cardiology)  Indicate any recent Medical Services you may  have received from other than [facility] providers in the past year (date may be approximate).     Assessment:   This is a routine wellness examination for Jose Bishop.  Hearing/Vision screen No results found.  Dietary issues and exercise activities discussed:     Goals Addressed   None    Depression Screen    11/14/2022    4:42 PM 09/11/2022    4:34 PM 07/31/2022   11:06 AM 06/12/2022    2:57 PM 06/01/2022   11:22 AM 03/08/2022   11:13 AM 02/22/2022    9:41 AM  PHQ 2/9 Scores  PHQ - 2 Score 3 0 2 2 2  2   PHQ- 9 Score 14 0  10 9  7      Information is confidential and restricted. Go to Review Flowsheets to unlock data.    Fall Risk    11/14/2022    4:45 PM 09/11/2022    4:34 PM 07/31/2022   11:06 AM 06/28/2022    9:06 AM 06/21/2022   11:38 AM  Fall Risk   Falls in the past year? 0 0 0 0 0  Number falls in past yr: 0 0   0  Injury with Fall? 0 0   0  Risk for fall due to : No Fall Risks   Impaired balance/gait;Orthopedic patient No Fall Risks  Follow up Falls evaluation completed   Falls evaluation completed     FALL RISK PREVENTION PERTAINING TO THE HOME:  Any stairs in or around the home? {YES/NO:21197} If so, are there any without handrails? {YES/NO:21197} Home free of loose throw rugs in walkways, pet beds, electrical cords, etc? {YES/NO:21197} Adequate lighting in your home to reduce risk of falls? {YES/NO:21197}  ASSISTIVE DEVICES UTILIZED TO PREVENT FALLS:  Life alert? {YES/NO:21197} Use of a cane, walker or w/c? {YES/NO:21197} Grab bars in the bathroom? {YES/NO:21197} Shower chair or bench in shower? {YES/NO:21197} Elevated toilet seat or a handicapped toilet? {YES/NO:21197}  TIMED UP AND GO:  Was the test performed? No . Telephonic visit   Cognitive Function:        12/01/2021   12:19 PM  6CIT Screen  What Year? 0 points  What month? 0 points  What time? 0 points  Count back from 20 0 points  Months in reverse 0 points  Repeat phrase 0 points  Total Score  0 points    Immunizations Immunization History  Administered Date(s) Administered   PFIZER(Purple Top)SARS-COV-2 Vaccination 08/30/2019, 09/20/2019   PNEUMOCOCCAL CONJUGATE-20 05/25/2021   Pneumococcal Polysaccharide-23 01/06/2020   Tdap 02/22/2022    TDAP status: Up to date  Pneumococcal vaccine status: Up to date  Covid-19 vaccine status: Information provided on how to obtain vaccines.   Qualifies for Shingles Vaccine? Yes   Zostavax completed No   Shingrix Completed?: No.    Education has been provided regarding the importance of this vaccine. Patient has been advised to call insurance company to determine out of pocket expense if they have not yet received this vaccine. Advised may also receive vaccine at local pharmacy or Health Dept. Verbalized acceptance and understanding.  Screening Tests Health Maintenance  Topic Date Due   Zoster Vaccines- Shingrix (1 of 2) Never done   COVID-19 Vaccine (3 - Pfizer risk series) 10/18/2019   OPHTHALMOLOGY EXAM  10/26/2022   Medicare Annual Wellness (AWV)  12/02/2022   INFLUENZA VACCINE  01/25/2023   HEMOGLOBIN A1C  03/24/2023   Diabetic kidney evaluation - Urine ACR  06/13/2023   FOOT EXAM  06/13/2023   Diabetic kidney evaluation - eGFR measurement  10/19/2023   Colonoscopy  10/26/2026   DTaP/Tdap/Td (2 - Td or Tdap) 02/23/2032   Hepatitis C Screening  Completed   HIV Screening  Completed   HPV VACCINES  Aged Out    Health Maintenance  Health Maintenance Due  Topic Date Due   Zoster Vaccines- Shingrix (1 of 2) Never done   COVID-19 Vaccine (3 - Pfizer risk series) 10/18/2019   OPHTHALMOLOGY EXAM  10/26/2022   Medicare Annual Wellness (AWV)  12/02/2022    Colorectal cancer screening: Type of screening: Colonoscopy. Completed 10/25/21. Repeat every 5 years  Lung Cancer Screening: (Low Dose CT Chest recommended if Age 47-80 years, 30 pack-year currently smoking OR have quit w/in 15years.) does not qualify.   Lung Cancer  Screening Referral: n/a  Additional Screening:  Hepatitis C Screening: does qualify; Completed 01/06/20  Vision Screening: Recommended annual ophthalmology exams for early detection of glaucoma and other disorders of the eye. Is the patient up to date with their annual eye exam?  {YES/NO:21197} Who is the provider or what is the name of the office in which the patient attends annual eye exams? *** If pt is not established with a provider, would they like to be referred to a provider to establish care? {YES/NO:21197}.   Dental Screening: Recommended annual dental exams for proper oral hygiene  Community Resource Referral / Chronic Care Management: CRR required this visit?  {YES/NO:21197}  CCM required this visit?  {YES/NO:21197}     Plan:     I have personally reviewed and noted the following in the patient's chart:   Medical and social history Use of alcohol, tobacco or illicit drugs  Current medications and supplements including opioid prescriptions. {Opioid Prescriptions:[phone]}  Functional ability and status Nutritional status Physical activity Advanced directives List of other physicians Hospitalizations, surgeries, and ER visits in previous 12 months Vitals Screenings to include cognitive, depression, and falls Referrals and appointments  In addition, I have reviewed and discussed with patient certain preventive protocols, quality metrics, and best practice recommendations. A written personalized care plan for preventive services as well as general preventive health recommendations were provided to patient.     Durwin Nora, [place]   08/09[birth year]   Due to this being a virtual visit, the after visit summary with patients personalized plan was offered to patient via mail or my-chart. ***Patient declined at this time./ Patient would like to access on my-chart/ per request, patient was mailed a copy of AVS./ Patient preferred to pick up at office at next  visit  Nurse Notes: ***

## 2022-12-06 ENCOUNTER — Ambulatory Visit: Payer: Medicare Other | Attending: Family Medicine

## 2022-12-06 VITALS — Ht 74.0 in | Wt 242.0 lb

## 2022-12-06 DIAGNOSIS — Z0000 Encounter for general adult medical examination without abnormal findings: Secondary | ICD-10-CM | POA: Diagnosis not present

## 2022-12-20 ENCOUNTER — Other Ambulatory Visit: Payer: Self-pay

## 2022-12-20 ENCOUNTER — Telehealth: Payer: Self-pay | Admitting: Family Medicine

## 2022-12-20 NOTE — Telephone Encounter (Signed)
Are their any options for this patient.

## 2022-12-20 NOTE — Telephone Encounter (Signed)
Noted  

## 2022-12-20 NOTE — Telephone Encounter (Signed)
Copied from CRM [id number]. Topic: General - Other >> Dec 20, 2022  2:09 PM Carrielelia G wrote: OZEMPIC, and [place] are too expensive, do you have samples available until I can get out of the "doughnut hole" as the insurance rep calls it Please advise

## 2022-12-22 ENCOUNTER — Other Ambulatory Visit: Payer: Self-pay | Admitting: Podiatry

## 2022-12-22 DIAGNOSIS — M79671 Pain in right foot: Secondary | ICD-10-CM

## 2022-12-22 DIAGNOSIS — M7741 Metatarsalgia, right foot: Secondary | ICD-10-CM

## 2022-12-22 NOTE — Telephone Encounter (Signed)
Pt was called and informed that he needs to call his endocrinologist.

## 2022-12-27 ENCOUNTER — Encounter: Payer: Self-pay | Admitting: Internal Medicine

## 2022-12-27 ENCOUNTER — Ambulatory Visit: Payer: Medicare Other | Admitting: Internal Medicine

## 2022-12-27 VITALS — BP 120/70 | HR 62 | Ht 74.0 in | Wt 249.4 lb

## 2022-12-27 DIAGNOSIS — Z7984 Long term (current) use of oral hypoglycemic drugs: Secondary | ICD-10-CM

## 2022-12-27 DIAGNOSIS — E1159 Type 2 diabetes mellitus with other circulatory complications: Secondary | ICD-10-CM | POA: Diagnosis not present

## 2022-12-27 DIAGNOSIS — I10 Essential (primary) hypertension: Secondary | ICD-10-CM | POA: Diagnosis not present

## 2022-12-27 DIAGNOSIS — E1165 Type 2 diabetes mellitus with hyperglycemia: Secondary | ICD-10-CM

## 2022-12-27 DIAGNOSIS — E782 Mixed hyperlipidemia: Secondary | ICD-10-CM | POA: Diagnosis not present

## 2022-12-27 DIAGNOSIS — Z7985 Long-term (current) use of injectable non-insulin antidiabetic drugs: Secondary | ICD-10-CM

## 2022-12-27 DIAGNOSIS — E119 Type 2 diabetes mellitus without complications: Secondary | ICD-10-CM

## 2022-12-27 LAB — LIPID PANEL
Cholesterol: 229 mg/dL — ABNORMAL HIGH (ref 0–200)
HDL: 59.4 mg/dL (ref >39.00)
NonHDL: 170.09
Total CHOL/HDL Ratio: 4
Triglycerides: 226 mg/dL — ABNORMAL HIGH (ref 0.0–149.0)
VLDL: 45.2 mg/dL — ABNORMAL HIGH (ref 0.0–40.0)

## 2022-12-27 LAB — AMB RESULTS CONSOLE CBG: Glucose: 177

## 2022-12-27 LAB — MICROALBUMIN / CREATININE URINE RATIO
Creatinine,U: 189.6 mg/dL
Microalb Creat Ratio: 0.4 mg/g (ref 0.0–30.0)
Microalb, Ur: <0.7 mg/dL (ref 0.0–1.9)

## 2022-12-27 LAB — HEMOGLOBIN A1C: Hemoglobin A1C: 7.6

## 2022-12-27 LAB — LDL CHOLESTEROL, DIRECT: Direct LDL: 143 mg/dL

## 2022-12-27 NOTE — Patient Instructions (Addendum)
Please continue: - Metformin ER [phone] mg daily  Increase: - Lantus 10-12 units at night  If sugars are still high after this, try to look at your meals and make changes there. If sugars are still high after this, let me know.  Please return in 3-4 months.

## 2022-12-27 NOTE — Progress Notes (Addendum)
Patient ID: TYLEEK WHILES, male   DOB: [birth year], 57 y.o.   MRN: [id number]  HPI: KRISTIAN BATDORF is a 57 y.o.-year-old male, returning for follow-up for DM2, dx in 2020, non-insulin-dependent, uncontrolled, with complications (aortic atherosclerosis, history of TIA, AAA, CKD). Pt. previously saw Dr. Everardo All, but last visit with me was 3 mo ago.  Interim history: He denies increased urination, blurry vision, chest pain. Before last visit, he started a CGM, and stopped Lantus, Metformin and Glimepiride.  He was able to start Jardiance and Ozempic.  As of now, however, he tells me that he is in the donut hole and he is not able to afford the 2 medications.  Therefore, he restarted Lantus and metformin.  Reviewed HbA1c: Lab Results  Component Value Date   HGBA1C 7.8 (A) 09/21/2022   HGBA1C 10.4 (A) 06/01/2022   HGBA1C 8.9 (A) 02/22/2022   HGBA1C 9.6 (A) 07/19/2021   HGBA1C 9.2 (A) 04/18/2021   HGBA1C 9.5 (H) 01/28/2021   HGBA1C 9.6 (A) 10/27/2020   HGBA1C 7.9 (H) 04/14/2020   HGBA1C 7.8 (A) 01/06/2020   HGBA1C 7.9 (H) 10/06/2019   At last visit he was on: - Metformin ER 500 mg 4x a day >> nausea/dry heaving - Glimepiride 2 mg in a.m. and before dinner - Ozempic 1 mg weekly - prev. off 2/2 doughnut hole (2 mg weekly caused nausea, could not eat meals, only ate snacks) He was on Jardiance for 1 mo >> stopped 2/2 coverage.  I advised him to change to: - Jardiance 25 mg before b'fast - restarted 06/2022 -  >> stopped before last visit - Ozempic 0.5 mg weekly - restarted 06/2022 - >> stopped before last visit  He ran out Jardiance and Ozempic 1 mo ago: Now on: - Metformin ER 500 - 500 - 1000 mg at bedtime - Lantus 8 units at bedtime  Pt checks his sugars 4x a day:  Previously:   Lowest sugar was 117 >> 60s >> 90; ? hypoglycemia awareness.  Highest sugar was 300s - steroid inj in back in 2023 >> 200 >> 300. He was on insulin briefly then.  Glucometer: One Touch  Pt's meals  are: - Breakfast (2:30-3 am): apple + banana + sunflower seeds, nuts, celery and carrots - Lunch: wraps, keto bread sandwich  - Dinner: meat, starch, veggies; chips >> fish, steamed veggies - Snacks: trailmix  - + CKD, last BUN/creatinine:  Lab Results  Component Value Date   BUN 22 10/19/2022   BUN 17 06/21/2022   CREATININE 1.15 10/19/2022   CREATININE 1.18 06/21/2022  He is on Lisinopril 30 mg daily.  -+ HL; last set of lipids: Lab Results  Component Value Date   CHOL 150 12/21/2021   HDL 59 12/21/2021   LDLCALC 71 12/21/2021   LDLDIRECT 145 (H) 01/06/2020   TRIG 113 12/21/2021   CHOLHDL 2.5 12/21/2021  He Is on Zetia and Repatha.  - last eye exam was on 10/26/2022. No DR. DR. Dione Booze. + cataract.  - no numbness and tingling in his feet.  Last foot exam was done by Dr. Logan Bores: 11/27/2022: No neuropathy  He also has a history of back pain-disabled due to this, prostate cancer, HTN. He had reconstructive surgery on both shoulders.   ROS: + see HPI  Past Medical History:  Diagnosis Date   AAA (abdominal aortic aneurysm) (HCC)    Anxiety    Bradycardia    Bradycardia    Difficulty sleeping    Essential hypertension  Family history of stroke    Headache    History of transient ischemic attack (TIA)    15 YRS AGO   Hyperlipidemia    Impotence of organic origin    Liver cyst    Memory loss    Osteoarthritis of acromioclavicular joint    Prostate cancer (HCC)    PVC (premature ventricular contraction)    Type 2 diabetes mellitus with hyperglycemia (HCC)    Past Surgical History:  Procedure Laterality Date   HERNIA REPAIR     X2 ( 1 ING HERNIA / 1 UMBILICAL HERNIA )   LYMPHADENECTOMY Bilateral 01/04/2015   Procedure: BILATERAL LYMPHADENECTOMY;  Surgeon: Heloise Purpura, MD;  Location: [facility];  Service: Urology;  Laterality: Bilateral;   MASS EXCISION     L UPPER ARM   PENILE PROSTHESIS IMPLANT  03/18/2019   ROBOT ASSISTED LAPAROSCOPIC RADICAL PROSTATECTOMY N/A  01/04/2015   Procedure: ROBOTIC ASSISTED LAPAROSCOPIC RADICAL PROSTATECTOMY LEVEL 2;  Surgeon: Heloise Purpura, MD;  Location: [facility];  Service: Urology;  Laterality: N/A;   ROTATOR CUFF REPAIR Bilateral    SURGERY SCROTAL / TESTICULAR     Social History   Socioeconomic History   Marital status: Married    Spouse name: Not on file   Number of children: 5   Years of education: Not on file   Highest education level: Associate degree: occupational, Scientist, product/process development, or vocational program  Occupational History   Occupation: part time  Tobacco Use   Smoking status: Never    Passive exposure: Never   Smokeless tobacco: Never  Vaping Use   Vaping Use: Never used  Substance and Sexual Activity   Alcohol use: Yes    Alcohol/week: 0.0 standard drinks of alcohol    Comment: OCCASIONAL   Drug use: No   Sexual activity: Yes    Partners: Female  Other Topics Concern   Not on file  Social History Narrative   Not on file   Social Determinants of Health   Financial Resource Strain: Low Risk  (12/06/2022)   Overall Financial Resource Strain (CARDIA)    Difficulty of Paying Living Expenses: Not hard at all  Food Insecurity: No Food Insecurity (12/06/2022)   Hunger Vital Sign    Worried About Running Out of Food in the Last Year: Never true    Ran Out of Food in the Last Year: Never true  Transportation Needs: No Transportation Needs (12/06/2022)   PRAPARE - Administrator, Civil Service (Medical): No    Lack of Transportation (Non-Medical): No  Physical Activity: Insufficiently Active (12/06/2022)   Exercise Vital Sign    Days of Exercise per Week: 3 days    Minutes of Exercise per Session: 30 min  Stress: No Stress Concern Present (12/06/2022)   [employer] of Occupational Health - Occupational Stress Questionnaire    Feeling of Stress : Only a little  Social Connections: Socially Integrated (12/06/2022)   Social Connection and Isolation Panel [NHANES]    Frequency of  Communication with Friends and Family: Twice a week    Frequency of Social Gatherings with Friends and Family: Twice a week    Attends Religious Services: More than 4 times per year    Active Member of [employer] or Organizations: Yes    Attends Banker Meetings: More than 4 times per year    Marital Status: Married  Catering manager Violence: Not At Risk (12/06/2022)   Humiliation, Afraid, Rape, and Kick questionnaire    Fear of  Current or Ex-Partner: No    Emotionally Abused: No    Physically Abused: No    Sexually Abused: No   Current Outpatient Medications on File Prior to Visit  Medication Sig Dispense Refill   APPLE CIDER VINEGAR PO Take by mouth.     BLACK CURRANT SEED OIL PO Take by mouth.     cetirizine (ZYRTEC) 10 MG tablet Take 1 tablet (10 mg total) by mouth daily. 30 tablet 11   chlorthalidone (HYGROTON) 25 MG tablet TAKE 1 TABLET(25 MG) BY MOUTH DAILY 90 tablet 1   CINNAMON PO Take by mouth.     clotrimazole-betamethasone (LOTRISONE) cream Apply 1 Application topically daily. 45 g 2   Continuous Blood Gluc Sensor (FREESTYLE LIBRE 3 SENSOR) MISC 1 each by Does not apply route every 14 (fourteen) days. 6 each 3   Evolocumab with Infusor (REPATHA PUSHTRONEX SYSTEM) 420 MG/3.5ML SOCT Inject 420 mg into the skin every 30 (thirty) days. 3.6 mL 11   ezetimibe (ZETIA) 10 MG tablet TAKE 1 TABLET(10 MG) BY MOUTH DAILY 90 tablet 1   fluticasone (FLONASE) 50 MCG/ACT nasal spray Place 2 sprays into both nostrils daily. 16 g 6   gabapentin (NEURONTIN) 300 MG capsule TAKE 1 CAPSULE(300 MG) BY MOUTH AT BEDTIME 30 capsule 3   Ginger, Zingiber officinalis, (GINGER PO) Take by mouth.     glucose blood (ONETOUCH VERIO) test strip 1 each by Other route daily. And lancets 1/day 100 each 3   ibuprofen (ADVIL) 600 MG tablet Take 1 tablet (600 mg total) by mouth every 8 (eight) hours as needed (pain). 15 tablet 0   Insulin Pen Needle 32G X 4 MM MISC Use 1x a day 100 each 3   latanoprost  (XALATAN) 0.005 % ophthalmic solution Place 1 drop into both eyes at bedtime.     lisinopril (ZESTRIL) 20 MG tablet TAKE 1 TABLET(30 MG) BY MOUTH DAILY 90 tablet 1   metFORMIN (GLUCOPHAGE) 500 MG tablet Take by mouth 2 (two) times daily with a meal. Take 1 tablet in the morning and 1 tablet at noon, and 2 tablets at bedtime     omeprazole (PRILOSEC) 40 MG capsule Take 1 capsule (40 mg total) by mouth in the morning. 30 capsule 1   OVER THE COUNTER MEDICATION BEET SUPPLEMENT     TURMERIC PO Take by mouth.     No current facility-administered medications on file prior to visit.   Allergies  Allergen Reactions   Bee Venom Anaphylaxis   Dapagliflozin Pro-Metformin Er    Lipitor [Atorvastatin]     FATIGUED NAUSEA BRAIN FOG   Family History  Problem Relation Age of Onset   Anuerysm Mother    Stroke Mother    Hypertension Father    Peripheral vascular disease Father    Stroke Father    Colon cancer Father        15's   Diabetes Sister    Heart murmur Sister    Heart murmur Son    Stomach cancer Neg Hx    Rectal cancer Neg Hx    Throat cancer Neg Hx    Esophageal cancer Neg Hx    Pancreatic cancer Neg Hx    PE: There were no vitals taken for this visit. Wt Readings from Last 3 Encounters:  12/06/22 242 lb (109.8 kg)  11/14/22 242 lb (109.8 kg)  10/19/22 247 lb 6 oz (112.2 kg)   Constitutional: overweight, in NAD Eyes:  EOMI, no exophthalmos ENT: no neck masses, no  cervical lymphadenopathy Cardiovascular: RRR, No MRG Respiratory: CTA B Musculoskeletal: no deformities Skin:no rashes Neurological: no tremor with outstretched hands  ASSESSMENT: 1. DM2, non-insulin-dependent, uncontrolled, with complications - Aortic ATC - per CTA 06/30/2022 - AAA - TIA - CKD  2. HL  PLAN:  1. Patient with longstanding, uncontrolled, type 2 diabetes, previously on oral antidiabetic regimen with SGLT2 inhibitor and also weekly GLP-1 receptor agonist, with improved control at last visit,  when HbA1c returned 7.8%, decreased from 10.4%.  At that time, he also lost 13 pounds since the previous visit, after addition of Jardiance at the end of 06/2022.  He did stop metformin due to GI symptoms when starting Jardiance.  Reviewing his blood sugars at that time, they were spectacularly improved, mostly fluctuating within the target range.  We continued the same regimen.  He was having some dry mouth with Jardiance, but overall, he wanted to continue with the same dose.  Discussed about staying well-hydrated. -At this visit, however, he tells me that unfortunately he is not able to afford Ozempic and Jardiance in the donut hole.  Therefore, he restarted metformin and Lantus. CGM interpretation: -At today's visit, we reviewed his CGM downloads: It appears that 81% of values are in target range (goal >70%), while 19% are higher than 180 (goal <25%), and 0% are lower than 70 (goal <4%).  The calculated average blood sugar is 155.  The projected HbA1c for the next 3 months (GMI) is 7.0%. -Reviewing the CGM trends, sugars appear to be more fluctuating than before, with still the majority of the values within the target range, but with significantly higher blood sugars after lunch.  After the initial postlunch hyperglycemic peak, sugars improved and they increase again after dinner and he does have some elevated blood sugars during the night, improving gradually after 12 AM.  He is not sure why the sugars increase fairly abruptly after 2 to 3 PM, but he does mention that he takes a nap in the afternoon and that is when he wakes up.  No coffee, sweet drinks, or a meal around that time.  It is possible that the sensor time may be off . The sensor just fell off this morning and he will replace it.  -She splits the metformin dose throughout the day due to GI symptoms.  However, for now, he mentions that he is able to continue with it.  He does take Lantus 8 units at night and, since sugars are fluctuating in the  upper interval of the target range, I advised him to increase Lantus by 2 to 4 units.  We also discussed about dietary changes, which she already started to institute.  I did advise him that if the sugars remain higher afterwards, to let me know, in that case, we may need to start him on a sulfonylurea or meglitinide.  I am hoping that next year we could resume Jardiance and Ozempic. -At today's visit, his CBG is 177 (he had grilled chicken for lunch ~1.5 hors ago) -will try to check his type 1 diabetes labs now: Orders Placed This Encounter  Procedures   Glucose, fasting   C-peptide   IA-2 Antibody   ZNT8 Antibodies   Glutamic acid decarboxylase auto abs   Lipid panel   Microalbumin / creatinine urine ratio  - I suggested to:  Patient Instructions  Please continue: - Metformin ER [phone] mg daily  Increase: - Lantus 10-12 units at night  If sugars are still high after this, try  to look at your meals and make changes there. If sugars are still high after this, let me know.  Please return in 3-4 months.  - we checked his HbA1c: 7.6% (lower) - advised to check sugars at different times of the day - 4x a day, rotating check times - advised for yearly eye exams >> he is UTD - return to clinic in 3-4 months  2. HL -Reviewed latest lipid panel from 11/2021: LDL higher than 55 due to cardiovascular disease, otherwise fractions at goal: Lab Results  Component Value Date   CHOL 150 12/21/2021   HDL 59 12/21/2021   LDLCALC 71 12/21/2021   LDLDIRECT 145 (H) 01/06/2020   TRIG 113 12/21/2021   CHOLHDL 2.5 12/21/2021  -He continues on Zetia 10 mg daily and Repatha 420 mg every 4 weeks without side effects -He is due for another lipid panel -will check today  Component     Latest Ref Rng 12/27/2022  Triglycerides     0.0 - 149.0 mg/dL 409.8 (H)   HDL Cholesterol     >39.00 mg/dL 11.91   Total CHOL/HDL Ratio 4   Direct LDL     mg/dL 478.2   Cholesterol     0 - 200 mg/dL 956 (H)    VLDL     0.0 - 40.0 mg/dL 21.3 (H)   NonHDL 086.57   LDL is much worse, triglycerides elevated, HDL at goal.  I am not sure why his LDL increased from 71-143, as he is on Repatha and Zetia.  I will check with the patient.  Component     Latest Ref Rng 12/27/2022  MICROALB/CREAT RATIO     0.0 - 30.0 mg/g 0.4   Microalb, Ur     0.0 - 1.9 mg/dL <8.4   Creatinine,U     mg/dL 696.2   C-Peptide     9.52 - 3.85 ng/mL 2.84   IA-2 Antibody     <5.4 U/mL <5.4   Glutamic Acid Decarb Ab     <5 IU/mL <5   Normal ACR. Good insulin production. Antipancreatic antibodies are not elevated. Znt8 antibody was not drawn, unfortunately.  Carlus Pavlov, MD PhD [facility] Endocr[facility]

## 2022-12-28 LAB — BASIC METABOLIC PANEL
BUN/Creatinine Ratio: 13 (ref 9–20)
BUN: 17 mg/dL (ref 6–24)
CO2: 22 mmol/L (ref 20–29)
Calcium: 9.6 mg/dL (ref 8.7–10.2)
Chloride: 99 mmol/L (ref 96–106)
Creatinine, Ser: 1.29 mg/dL — ABNORMAL HIGH (ref 0.76–1.27)
Glucose: 137 mg/dL — ABNORMAL HIGH (ref 70–99)
Potassium: 4.3 mmol/L (ref 3.5–5.2)
Sodium: 137 mmol/L (ref 134–144)
eGFR: 65 mL/min/{1.73_m2} (ref >59)

## 2022-12-29 ENCOUNTER — Telehealth: Payer: Self-pay

## 2022-12-29 LAB — GLUTAMIC ACID DECARBOXYLASE AUTO ABS: Glutamic Acid Decarb Ab: <5 IU/mL (ref <5)

## 2022-12-29 NOTE — Telephone Encounter (Signed)
Copied from CRM [id number]. Topic: General - Other >> Dec 29, 2022  9:00 AM Dominique A wrote: Reason for CRM: Pt is calling to see why Dr. Alvis Lemmings sent in a referral for [facility]. Pt states that he last seen his PCP in March and he is already a pt of [facility] and went to them in April and is wanting to know why a referral was sent to them when he already goes to them. Pt states that he was called by [employer] today stating that they received a referral for him. Pt would like to speak with his PCP and want to know if the referrals are backed up. Please call pt back.

## 2023-01-01 ENCOUNTER — Other Ambulatory Visit: Payer: Self-pay | Admitting: Nurse Practitioner

## 2023-01-02 ENCOUNTER — Telehealth: Payer: Self-pay | Admitting: Pharmacist

## 2023-01-02 MED ORDER — REPATHA SURECLICK 140 MG/ML ~~LOC~~ SOAJ: 140.0000 mg | SUBCUTANEOUS | 3 refills | Status: DC

## 2023-01-02 NOTE — Telephone Encounter (Signed)
Called pt to change him from Repatha 420mg  monthly to 140mg  twice monthly since monthly formulation has been discontinued. Reviewed updated injection instructions with the 140mg  dose. Advised him he'll need to let his pharmacy know to link his [employer] grant to the updated rx.

## 2023-01-03 LAB — C-PEPTIDE: C-Peptide: 2.84 ng/mL (ref 0.80–3.85)

## 2023-01-03 LAB — IA-2 ANTIBODY: IA-2 Antibody: <5.4 U/mL (ref <5.4)

## 2023-01-03 LAB — GLUCOSE, FASTING: Glucose, Bld: 137 mg/dL — ABNORMAL HIGH (ref 65–99)

## 2023-01-05 ENCOUNTER — Ambulatory Visit: Payer: Medicare Other | Admitting: Internal Medicine

## 2023-01-26 ENCOUNTER — Ambulatory Visit
Admission: EM | Admit: 2023-01-26 | Discharge: 2023-01-26 | Disposition: A | Discharge status: Home / Self Care | Payer: Medicare Other | Attending: Internal Medicine | Admitting: Internal Medicine

## 2023-01-26 DIAGNOSIS — S161XXA Strain of muscle, fascia and tendon at neck level, initial encounter: Secondary | ICD-10-CM

## 2023-01-26 DIAGNOSIS — S39012A Strain of muscle, fascia and tendon of lower back, initial encounter: Secondary | ICD-10-CM

## 2023-01-26 MED ORDER — METHOCARBAMOL 500 MG PO TABS: 500.0000 mg | ORAL_TABLET | Freq: Two times a day (BID) | ORAL | 0 refills | Status: DC
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
## 2023-02-08 ENCOUNTER — Other Ambulatory Visit: Payer: Self-pay | Admitting: Physician Assistant

## 2023-02-08 ENCOUNTER — Ambulatory Visit: Payer: Medicare Other | Admitting: Family Medicine

## 2023-02-08 DIAGNOSIS — E782 Mixed hyperlipidemia: Secondary | ICD-10-CM

## 2023-02-27 DIAGNOSIS — M5451 Vertebrogenic low back pain: Secondary | ICD-10-CM | POA: Diagnosis not present

## 2023-02-27 DIAGNOSIS — M5459 Other low back pain: Secondary | ICD-10-CM | POA: Diagnosis not present

## 2023-03-14 ENCOUNTER — Ambulatory Visit: Payer: Medicare Other | Admitting: Family Medicine

## 2023-03-14 DIAGNOSIS — M5451 Vertebrogenic low back pain: Secondary | ICD-10-CM | POA: Diagnosis not present

## 2023-03-21 DIAGNOSIS — M5451 Vertebrogenic low back pain: Secondary | ICD-10-CM | POA: Diagnosis not present

## 2023-03-21 DIAGNOSIS — M5136 Other intervertebral disc degeneration, lumbar region: Secondary | ICD-10-CM | POA: Diagnosis not present

## 2023-04-12 DIAGNOSIS — M5416 Radiculopathy, lumbar region: Secondary | ICD-10-CM | POA: Diagnosis not present

## 2023-04-25 DIAGNOSIS — H40053 Ocular hypertension, bilateral: Secondary | ICD-10-CM | POA: Diagnosis not present

## 2023-04-25 DIAGNOSIS — E119 Type 2 diabetes mellitus without complications: Secondary | ICD-10-CM | POA: Diagnosis not present

## 2023-04-25 DIAGNOSIS — H25813 Combined forms of age-related cataract, bilateral: Secondary | ICD-10-CM | POA: Diagnosis not present

## 2023-04-25 LAB — HM DIABETES EYE EXAM

## 2023-05-04 ENCOUNTER — Encounter: Payer: Self-pay | Admitting: Internal Medicine

## 2023-05-04 ENCOUNTER — Ambulatory Visit: Payer: Medicare Other | Admitting: Internal Medicine

## 2023-05-04 VITALS — BP 108/70 | HR 53 | Resp 20 | Ht 74.0 in | Wt 249.4 lb

## 2023-05-04 DIAGNOSIS — E1165 Type 2 diabetes mellitus with hyperglycemia: Secondary | ICD-10-CM | POA: Diagnosis not present

## 2023-05-04 DIAGNOSIS — E1159 Type 2 diabetes mellitus with other circulatory complications: Secondary | ICD-10-CM

## 2023-05-04 DIAGNOSIS — E782 Mixed hyperlipidemia: Secondary | ICD-10-CM | POA: Diagnosis not present

## 2023-05-04 DIAGNOSIS — Z7984 Long term (current) use of oral hypoglycemic drugs: Secondary | ICD-10-CM

## 2023-05-04 LAB — POCT GLYCOSYLATED HEMOGLOBIN (HGB A1C): Hemoglobin A1C: 7.7 % — AB (ref 4.0–5.6)

## 2023-05-04 MED ORDER — FREESTYLE LIBRE 3 SENSOR MISC: 1.0000 | 3 refills | Status: DC

## 2023-05-04 NOTE — Patient Instructions (Addendum)
Please continue: - Metformin ER [id number] mg daily  Stop Lantus.  Please return in 3-4 months.

## 2023-05-04 NOTE — Addendum Note (Signed)
Addended by: Tera Partridge on: 05/04/2023 02:07 PM   Modules accepted: Orders

## 2023-05-04 NOTE — Progress Notes (Signed)
Patient ID: Jose Bishop, male   DOB: 15-Feb-1966, 57 y.o.   MRN: [id number]  HPI: Jose Bishop is a 57 y.o.-year-old male, returning for follow-up for DM2, dx in 2020, non-insulin-dependent, uncontrolled, with complications (aortic atherosclerosis, history of TIA, AAA, CKD). Pt. previously saw Dr. Everardo All, but last visit with me was 4 mo ago.  Interim history: He denies increased urination, nausea, chest pain. He has blurry vision - needs cataract Sx. He is taking Sour Sop  - tea, sea moss. He changed his diet since last OV. He reduced carbs - he tries to have up to 30g/day.  Reviewed HbA1c: Lab Results  Component Value Date   HGBA1C 7.6 12/27/2022   HGBA1C 7.8 (A) 09/21/2022   HGBA1C 10.4 (A) 06/01/2022   HGBA1C 8.9 (A) 02/22/2022   HGBA1C 9.6 (A) 07/19/2021   HGBA1C 9.2 (A) 04/18/2021   HGBA1C 9.5 (H) 01/28/2021   HGBA1C 9.6 (A) 10/27/2020   HGBA1C 7.9 (H) 04/14/2020   HGBA1C 7.8 (A) 01/06/2020   Previously on: - Metformin ER 500 mg 4x a day >> nausea/dry heaving - Glimepiride 2 mg in a.m. and before dinner - Ozempic 1 mg weekly - prev. off 2/2 doughnut hole (2 mg weekly caused nausea, could not eat meals, only ate snacks) He was on Jardiance for 1 mo >> stopped 2/2 coverage.  We changed to: - Jardiance 25 mg before b'fast - restarted 06/2022 -  >> stopped - Ozempic 0.5 mg weekly - restarted 06/2022 - >> stopped  He ran out Jardiance and Ozempic 1 mo prior to our appointment in 12/2022.  Now on: - Metformin ER 500 - 500 - 1000 mg at bedtime - Lantus 8 >> 10-12 units at bedtime - "whenever I take it" - occasionally.  Pt checks his sugars 4x a day - last few days: - am: 113- 142, 166 - 2h after b'fast: - lunch: n/c >> 111 - 2h after lunch: 119, 218, 244, 247, 290 (w/o Lantus) - dinner: 101 - 2h after dinner: 209 (spaghetti) - bedtime: n/c   Previously:  Previously:   Lowest sugar was  60s >> 90 >> 113; ? hypoglycemia awareness.  Highest sugar was 200 >> 300  >> 290. He was on insulin briefly then.  Glucometer: One Touch  Pt's meals are: - Breakfast (2:30-3 am): apple + banana + sunflower seeds, nuts, celery and carrots >> carrots, radishes, celery - Lunch: wraps, keto bread sandwich  - Dinner: meat, starch, veggies; chips >> fish, steamed veggies - Snacks: trailmix  - + CKD, last BUN/creatinine:  Lab Results  Component Value Date   BUN 17 12/27/2022   BUN 22 10/19/2022   CREATININE 1.29 (H) 12/27/2022   CREATININE 1.15 10/19/2022   Lab Results  Component Value Date   MICRALBCREAT 0.4 12/27/2022   MICRALBCREAT <4 06/12/2022   MICRALBCREAT 3 01/06/2020  He is on Lisinopril 30 mg daily.  -+ HL; last set of lipids: Lab Results  Component Value Date   CHOL 229 (H) 12/27/2022   HDL 59.40 12/27/2022   LDLCALC 71 12/21/2021   LDLDIRECT 143.0 12/27/2022   TRIG 226.0 (H) 12/27/2022   CHOLHDL 4 12/27/2022  He Is on Zetia and Repatha.  - last eye exam was on 10/26/2022. No DR. DR. Dione Booze. + cataract.  - no numbness and tingling in his feet.  Last foot exam was done by Dr. Logan Bores: 11/27/2022: No neuropathy  He also has a history of back pain-disabled due to this, prostate cancer, HTN.  He had reconstructive surgery on both shoulders.   ROS: + see HPI  Past Medical History:  Diagnosis Date   AAA (abdominal aortic aneurysm) (HCC)    Anxiety    Bradycardia    Bradycardia    Difficulty sleeping    Essential hypertension    Family history of stroke    Headache    History of transient ischemic attack (TIA)    15 YRS AGO   Hyperlipidemia    Impotence of organic origin    Liver cyst    Memory loss    Osteoarthritis of acromioclavicular joint    Prostate cancer (HCC)    PVC (premature ventricular contraction)    Type 2 diabetes mellitus with hyperglycemia (HCC)    Past Surgical History:  Procedure Laterality Date   HERNIA REPAIR     X2 ( 1 ING HERNIA / 1 UMBILICAL HERNIA )   LYMPHADENECTOMY Bilateral 01/04/2015   Procedure:  BILATERAL LYMPHADENECTOMY;  Surgeon: Heloise Purpura, MD;  Location: [facility];  Service: Urology;  Laterality: Bilateral;   MASS EXCISION     L UPPER ARM   PENILE PROSTHESIS IMPLANT  03/18/2019   ROBOT ASSISTED LAPAROSCOPIC RADICAL PROSTATECTOMY N/A 01/04/2015   Procedure: ROBOTIC ASSISTED LAPAROSCOPIC RADICAL PROSTATECTOMY LEVEL 2;  Surgeon: Heloise Purpura, MD;  Location: [facility];  Service: Urology;  Laterality: N/A;   ROTATOR CUFF REPAIR Bilateral    SURGERY SCROTAL / TESTICULAR     Social History   Socioeconomic History   Marital status: Married    Spouse name: Not on file   Number of children: 5   Years of education: Not on file   Highest education level: Associate degree: occupational, Scientist, product/process development, or vocational program  Occupational History   Occupation: part time  Tobacco Use   Smoking status: Never    Passive exposure: Never   Smokeless tobacco: Never  Vaping Use   Vaping status: Never Used  Substance and Sexual Activity   Alcohol use: Yes    Alcohol/week: 0.0 standard drinks of alcohol    Comment: OCCASIONAL   Drug use: No   Sexual activity: Yes    Partners: Female  Other Topics Concern   Not on file  Social History Narrative   Not on file   Social Determinants of Health   Financial Resource Strain: Low Risk  (12/06/2022)   Overall Financial Resource Strain (CARDIA)    Difficulty of Paying Living Expenses: Not hard at all  Food Insecurity: No Food Insecurity (12/06/2022)   Hunger Vital Sign    Worried About Running Out of Food in the Last Year: Never true    Ran Out of Food in the Last Year: Never true  Transportation Needs: No Transportation Needs (12/06/2022)   PRAPARE - Administrator, Civil Service (Medical): No    Lack of Transportation (Non-Medical): No  Physical Activity: Insufficiently Active (12/06/2022)   Exercise Vital Sign    Days of Exercise per Week: 3 days    Minutes of Exercise per Session: 30 min  Stress: No Stress Concern Present  (12/06/2022)   [employer] of Occupational Health - Occupational Stress Questionnaire    Feeling of Stress : Only a little  Social Connections: Socially Integrated (12/06/2022)   Social Connection and Isolation Panel [NHANES]    Frequency of Communication with Friends and Family: Twice a week    Frequency of Social Gatherings with Friends and Family: Twice a week    Attends Religious Services: More than 4 times per  year    Active Member of Clubs or Organizations: Yes    Attends Banker Meetings: More than 4 times per year    Marital Status: Married  Catering manager Violence: Not At Risk (12/06/2022)   Humiliation, Afraid, Rape, and Kick questionnaire    Fear of Current or Ex-Partner: No    Emotionally Abused: No    Physically Abused: No    Sexually Abused: No   Current Outpatient Medications on File Prior to Visit  Medication Sig Dispense Refill   APPLE CIDER VINEGAR PO Take by mouth.     BLACK CURRANT SEED OIL PO Take by mouth.     cetirizine (ZYRTEC) 10 MG tablet Take 1 tablet (10 mg total) by mouth daily. 30 tablet 11   chlorthalidone (HYGROTON) 25 MG tablet TAKE 1 TABLET(25 MG) BY MOUTH DAILY 90 tablet 1   CINNAMON PO Take by mouth.     clotrimazole-betamethasone (LOTRISONE) cream Apply 1 Application topically daily. 45 g 2   Continuous Blood Gluc Sensor (FREESTYLE LIBRE 3 SENSOR) MISC 1 each by Does not apply route every 14 (fourteen) days. 6 each 3   Evolocumab (REPATHA SURECLICK) 140 MG/ML SOAJ Inject 140 mg into the skin every 14 (fourteen) days. 6 mL 3   ezetimibe (ZETIA) 10 MG tablet TAKE 1 TABLET(10 MG) BY MOUTH DAILY 90 tablet 0   fluticasone (FLONASE) 50 MCG/ACT nasal spray Place 2 sprays into both nostrils daily. 16 g 6   gabapentin (NEURONTIN) 300 MG capsule TAKE 1 CAPSULE(300 MG) BY MOUTH AT BEDTIME 30 capsule 3   Ginger, Zingiber officinalis, (GINGER PO) Take by mouth.     glucose blood (ONETOUCH VERIO) test strip 1 each by Other route daily. And  lancets 1/day 100 each 3   ibuprofen (ADVIL) 600 MG tablet Take 1 tablet (600 mg total) by mouth every 8 (eight) hours as needed (pain). 15 tablet 0   Insulin Pen Needle 32G X 4 MM MISC Use 1x a day 100 each 3   latanoprost (XALATAN) 0.005 % ophthalmic solution Place 1 drop into both eyes at bedtime.     lisinopril (ZESTRIL) 20 MG tablet TAKE 1 TABLET(30 MG) BY MOUTH DAILY 90 tablet 1   metFORMIN (GLUCOPHAGE) 500 MG tablet Take by mouth 2 (two) times daily with a meal. Take 1 tablet in the morning and 1 tablet at noon, and 2 tablets at bedtime     methocarbamol (ROBAXIN) 500 MG tablet Take 1 tablet (500 mg total) by mouth 2 (two) times daily. 20 tablet 0   omeprazole (PRILOSEC) 40 MG capsule Take 1 capsule (40 mg total) by mouth in the morning. 30 capsule 1   OVER THE COUNTER MEDICATION BEET SUPPLEMENT     TURMERIC PO Take by mouth.     No current facility-administered medications on file prior to visit.   Allergies  Allergen Reactions   Bee Venom Anaphylaxis   Dapagliflozin Pro-Metformin Er    Lipitor [Atorvastatin]     FATIGUED NAUSEA BRAIN FOG   Family History  Problem Relation Age of Onset   Anuerysm Mother    Stroke Mother    Hypertension Father    Peripheral vascular disease Father    Stroke Father    Colon cancer Father        31's   Diabetes Sister    Heart murmur Sister    Heart murmur Son    Stomach cancer Neg Hx    Rectal cancer Neg Hx    Throat  cancer Neg Hx    Esophageal cancer Neg Hx    Pancreatic cancer Neg Hx    PE: There were no vitals taken for this visit. Wt Readings from Last 3 Encounters:  12/27/22 249 lb 6.4 oz (113.1 kg)  12/06/22 242 lb (109.8 kg)  11/14/22 242 lb (109.8 kg)   Constitutional: overweight, in NAD Eyes:  EOMI, no exophthalmos ENT: no neck masses, no cervical lymphadenopathy Cardiovascular: RRR, No MRG Respiratory: CTA B Musculoskeletal: no deformities Skin:no rashes Neurological: no tremor with outstretched  hands  ASSESSMENT: 1. DM2, non-insulin-dependent, uncontrolled, with complications - Aortic ATC - per CTA 06/30/2022 - AAA - TIA - CKD  Component     Latest Ref Rng 12/27/2022  C-Peptide     0.80 - 3.85 ng/mL 2.84   IA-2 Antibody     <5.4 U/mL <5.4   Glutamic Acid Decarb Ab     <5 IU/mL <5   Znt8 antibody was not drawn, unfortunately.  2. HL  PLAN:  1. Patient with longstanding, uncontrolled, type 2 diabetes, on oral medication with metformin and previously on SGLT2 inhibitor and weekly GLP-1 receptor agonist, with improved control, however, off the latter 2 medications due to price.  We had to restart metformin and at last visit also Lantus due to high blood sugars.  I also recommended dietary changes.  We checked it for type 1 diabetes and the investigation was negative. CGM interpretation: -At today's visit, we reviewed his CGM downloads: It appears that 78% of values are in target range (goal >70%), while 22% are higher than 180 (goal <25%), and 0% are lower than 70 (goal <4%).  The calculated average blood sugar is 155.  The projected HbA1c for the next 3 months (GMI) is 7.0%. -Reviewing the CGM trends, sugars appear to have been quite high in August and late September.  However, they have improved in the last few weeks.  Reviewing his CGM tracings from the beginning of September, his predicted HbA1c was 7.0% and sugars are mostly fluctuating within the upper half of the target range with occasional hyperglycemic spikes after meals.  As of now he is taking inconsistently with his meter and sugars appear to be slightly lower.  We do need to have him go back to the CGM.  He was not able to obtain the [place] 3 recently due to [employer].  We did not have a sample to give him but I gave him a sample of Dexcom sensor.  I also gave him a printed prescription for the libre 3 CGM to see if he can get it from another pharmacy.  If not, I can try to either send a prescription for the Libre 2  or Dexcom G7 to his pharmacy. -Since her sugars are improved despite the fact that he is taking Lantus very consistently, and he is continuing to work on his diet, for now, per his preference, I advised him to stop Lantus.  Will continue only with the metformin for now.  I advised him that during the holidays, if the sugars increase, to let me know. - I suggested to:  Patient Instructions  Please continue: - Metformin ER [phone] mg daily  Stop Lantus.  Please return in 3-4 months.  - we checked his HbA1c: 7.7% (slightly higher) - advised to check sugars at different times of the day - 4x a day, rotating check times - advised for yearly eye exams >> he is UTD - return to clinic in 3-4 months  2. HL -At last check, LDL increased significantly, from 71-143.  Triglycerides are also elevated.  Our target for his LDL is actually 55 or less due to history of cardiovascular disease: Lab Results  Component Value Date   CHOL 229 (H) 12/27/2022   HDL 59.40 12/27/2022   LDLCALC 71 12/21/2021   LDLDIRECT 143.0 12/27/2022   TRIG 226.0 (H) 12/27/2022   CHOLHDL 4 12/27/2022  -On Zetia 10 mg daily and Repatha 420 mg every 4 weeks without side effects. -Continues to improve his diet, only having dietary indiscretions occasionally  Carlus Pavlov, MD PhD [facility]

## 2023-05-05 DIAGNOSIS — M47816 Spondylosis without myelopathy or radiculopathy, lumbar region: Secondary | ICD-10-CM | POA: Diagnosis not present

## 2023-05-22 DIAGNOSIS — M25551 Pain in right hip: Secondary | ICD-10-CM | POA: Diagnosis not present

## 2023-05-22 DIAGNOSIS — M25552 Pain in left hip: Secondary | ICD-10-CM | POA: Diagnosis not present

## 2023-05-28 ENCOUNTER — Ambulatory Visit: Payer: Medicare Other | Attending: Family Medicine | Admitting: Family Medicine

## 2023-05-28 ENCOUNTER — Encounter: Payer: Self-pay | Admitting: Family Medicine

## 2023-05-28 VITALS — BP 157/85 | HR 64 | Ht 74.0 in | Wt 254.0 lb

## 2023-05-28 DIAGNOSIS — E1169 Type 2 diabetes mellitus with other specified complication: Secondary | ICD-10-CM | POA: Diagnosis not present

## 2023-05-28 DIAGNOSIS — M25552 Pain in left hip: Secondary | ICD-10-CM | POA: Diagnosis not present

## 2023-05-28 DIAGNOSIS — M25551 Pain in right hip: Secondary | ICD-10-CM | POA: Diagnosis not present

## 2023-05-28 DIAGNOSIS — Z8546 Personal history of malignant neoplasm of prostate: Secondary | ICD-10-CM

## 2023-05-28 DIAGNOSIS — M545 Low back pain, unspecified: Secondary | ICD-10-CM

## 2023-05-28 DIAGNOSIS — Z7984 Long term (current) use of oral hypoglycemic drugs: Secondary | ICD-10-CM | POA: Diagnosis not present

## 2023-05-28 DIAGNOSIS — I152 Hypertension secondary to endocrine disorders: Secondary | ICD-10-CM | POA: Diagnosis not present

## 2023-05-28 DIAGNOSIS — E785 Hyperlipidemia, unspecified: Secondary | ICD-10-CM | POA: Diagnosis not present

## 2023-05-28 DIAGNOSIS — E1159 Type 2 diabetes mellitus with other circulatory complications: Secondary | ICD-10-CM | POA: Diagnosis not present

## 2023-05-28 DIAGNOSIS — G720 Drug-induced myopathy: Secondary | ICD-10-CM | POA: Diagnosis not present

## 2023-05-28 MED ORDER — EZETIMIBE 10 MG PO TABS: ORAL_TABLET | ORAL | 0 refills | Status: DC

## 2023-05-28 MED ORDER — CHLORTHALIDONE 25 MG PO TABS: ORAL_TABLET | ORAL | 1 refills | Status: DC

## 2023-05-28 MED ORDER — LISINOPRIL 20 MG PO TABS: ORAL_TABLET | ORAL | 1 refills | Status: DC

## 2023-05-28 NOTE — Patient Instructions (Signed)
Managing Your Hypertension Hypertension, also called high blood pressure, is when the force of the blood pressing against the walls of the arteries is too strong. Arteries are blood vessels that carry blood from your heart throughout your body. Hypertension forces the heart to work harder to pump blood and may cause the arteries to become narrow or stiff. Understanding blood pressure readings A blood pressure reading includes a higher number over a lower number: The first, or top, number is called the systolic pressure. It is a measure of the pressure in your arteries as your heart beats. The second, or bottom number, is called the diastolic pressure. It is a measure of the pressure in your arteries as the heart relaxes. For most people, a normal blood pressure is below 120/80. Your personal target blood pressure may vary depending on your medical conditions, your age, and other factors. Blood pressure is classified into four stages. Based on your blood pressure reading, your health care provider may use the following stages to determine what type of treatment you need, if any. Systolic pressure and diastolic pressure are measured in a unit called millimeters of mercury (mmHg). Normal Systolic pressure: below 120. Diastolic pressure: below 80. Elevated Systolic pressure: 120-129. Diastolic pressure: below 80. Hypertension stage 1 Systolic pressure: 130-139. Diastolic pressure: 80-89. Hypertension stage 2 Systolic pressure: 140 or above. Diastolic pressure: 90 or above. How can this condition affect me? Managing your hypertension is very important. Over time, hypertension can damage the arteries and decrease blood flow to parts of the body, including the brain, heart, and kidneys. Having untreated or uncontrolled hypertension can lead to: A heart attack. A stroke. A weakened blood vessel (aneurysm). Heart failure. Kidney damage. Eye damage. Memory and concentration problems. Vascular  dementia. What actions can I take to manage this condition? Hypertension can be managed by making lifestyle changes and possibly by taking medicines. Your health care provider will help you make a plan to bring your blood pressure within a normal range. You may be referred for counseling on a healthy diet and physical activity. Nutrition  Eat a diet that is high in fiber and potassium, and low in salt (sodium), added sugar, and fat. An example eating plan is called the DASH diet. DASH stands for Dietary Approaches to Stop Hypertension. To eat this way: Eat plenty of fresh fruits and vegetables. Try to fill one-half of your plate at each meal with fruits and vegetables. Eat whole grains, such as whole-wheat pasta, brown rice, or whole-grain bread. Fill about one-fourth of your plate with whole grains. Eat low-fat dairy products. Avoid fatty cuts of meat, processed or cured meats, and poultry with skin. Fill about one-fourth of your plate with lean proteins such as fish, chicken without skin, beans, eggs, and tofu. Avoid pre-made and processed foods. These tend to be higher in sodium, added sugar, and fat. Reduce your daily sodium intake. Many people with hypertension should eat less than 1,500 mg of sodium a day. Lifestyle  Work with your health care provider to maintain a healthy body weight or to lose weight. Ask what an ideal weight is for you. Get at least 30 minutes of exercise that causes your heart to beat faster (aerobic exercise) most days of the week. Activities may include walking, swimming, or biking. Include exercise to strengthen your muscles (resistance exercise), such as weight lifting, as part of your weekly exercise routine. Try to do these types of exercises for 30 minutes at least 3 days a week. Do   not use any products that contain nicotine or tobacco. These products include cigarettes, chewing tobacco, and vaping devices, such as e-cigarettes. If you need help quitting, ask your  health care provider. Control any long-term (chronic) conditions you have, such as high cholesterol or diabetes. Identify your sources of stress and find ways to manage stress. This may include meditation, deep breathing, or making time for fun activities. Alcohol use Do not drink alcohol if: Your health care provider tells you not to drink. You are pregnant, may be pregnant, or are planning to become pregnant. If you drink alcohol: Limit how much you have to: 0-1 drink a day for women. 0-2 drinks a day for men. Know how much alcohol is in your drink. In the [place] one drink equals one 12 oz bottle of beer (355 mL), one 5 oz glass of wine (148 mL), or one 1 oz glass of hard liquor (44 mL). Medicines Your health care provider may prescribe medicine if lifestyle changes are not enough to get your blood pressure under control and if: Your systolic blood pressure is 130 or higher. Your diastolic blood pressure is 80 or higher. Take medicines only as told by your health care provider. Follow the directions carefully. Blood pressure medicines must be taken as told by your health care provider. The medicine does not work as well when you skip doses. Skipping doses also puts you at risk for problems. Monitoring Before you monitor your blood pressure: Do not smoke, drink caffeinated beverages, or exercise within 30 minutes before taking a measurement. Use the bathroom and empty your bladder (urinate). Sit quietly for at least 5 minutes before taking measurements. Monitor your blood pressure at home as told by your health care provider. To do this: Sit with your back straight and supported. Place your feet flat on the floor. Do not cross your legs. Support your arm on a flat surface, such as a table. Make sure your upper arm is at heart level. Each time you measure, take two or three readings one minute apart and record the results. You may also need to have your blood pressure checked regularly by  your health care provider. General information Talk with your health care provider about your diet, exercise habits, and other lifestyle factors that may be contributing to hypertension. Review all the medicines you take with your health care provider because there may be side effects or interactions. Keep all follow-up visits. Your health care provider can help you create and adjust your plan for managing your high blood pressure. Where to find more information National Heart, Lung, and Blood Institute: www.nhlbi.nih.gov American Heart Association: www.heart.org Contact a health care provider if: You think you are having a reaction to medicines you have taken. You have repeated (recurrent) headaches. You feel dizzy. You have swelling in your ankles. You have trouble with your vision. Get help right away if: You develop a severe headache or confusion. You have unusual weakness or numbness, or you feel faint. You have severe pain in your chest or abdomen. You vomit repeatedly. You have trouble breathing. These symptoms may be an emergency. Get help right away. Call 911. Do not wait to see if the symptoms will go away. Do not drive yourself to the hospital. Summary Hypertension is when the force of blood pumping through your arteries is too strong. If this condition is not controlled, it may put you at risk for serious complications. Your personal target blood pressure may vary depending on your medical conditions,   your age, and other factors. For most people, a normal blood pressure is less than 120/80. Hypertension is managed by lifestyle changes, medicines, or both. Lifestyle changes to help manage hypertension include losing weight, eating a healthy, low-sodium diet, exercising more, stopping smoking, and limiting alcohol. This information is not intended to replace advice given to you by your health care provider. Make sure you discuss any questions you have with your health care  provider. Document Revised: 02/24/2021 Document Reviewed: 02/24/2021 Elsevier Patient Education  2024 [employer]  
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
## 2023-05-29 LAB — PSA, TOTAL AND FREE
PSA, Free: <0.02 ng/mL
Prostate Specific Ag, Serum: <0.1 ng/mL (ref 0.0–4.0)

## 2023-05-29 LAB — CMP14+EGFR
ALT: 30 [IU]/L (ref 0–44)
AST: 27 [IU]/L (ref 0–40)
Albumin: 3.9 g/dL (ref 3.8–4.9)
Alkaline Phosphatase: 85 [IU]/L (ref 44–121)
BUN/Creatinine Ratio: 13 (ref 9–20)
BUN: 13 mg/dL (ref 6–24)
Bilirubin Total: 0.3 mg/dL (ref 0.0–1.2)
CO2: 23 mmol/L (ref 20–29)
Calcium: 9.9 mg/dL (ref 8.7–10.2)
Chloride: 100 mmol/L (ref 96–106)
Creatinine, Ser: 1.02 mg/dL (ref 0.76–1.27)
Globulin, Total: 3 g/dL (ref 1.5–4.5)
Glucose: 189 mg/dL — ABNORMAL HIGH (ref 70–99)
Potassium: 4.6 mmol/L (ref 3.5–5.2)
Sodium: 137 mmol/L (ref 134–144)
Total Protein: 6.9 g/dL (ref 6.0–8.5)
eGFR: 86 mL/min/{1.73_m2} (ref >59)

## 2023-07-08 DIAGNOSIS — M1611 Unilateral primary osteoarthritis, right hip: Secondary | ICD-10-CM | POA: Insufficient documentation

## 2023-08-07 ENCOUNTER — Ambulatory Visit: Payer: Medicare Other | Admitting: Physician Assistant

## 2023-08-07 ENCOUNTER — Encounter: Payer: Self-pay | Admitting: Physician Assistant

## 2023-08-07 VITALS — BP 147/77 | HR 50 | Temp 98.7°F | Ht 75.0 in | Wt 250.0 lb

## 2023-08-07 DIAGNOSIS — J0100 Acute maxillary sinusitis, unspecified: Secondary | ICD-10-CM

## 2023-08-07 DIAGNOSIS — I10 Essential (primary) hypertension: Secondary | ICD-10-CM | POA: Diagnosis not present

## 2023-08-07 MED ORDER — CETIRIZINE HCL 10 MG PO TABS
10.0000 mg | ORAL_TABLET | Freq: Every day | ORAL | 11 refills | Status: AC
Start: 2023-08-07 — End: ?

## 2023-08-07 MED ORDER — FLUTICASONE PROPIONATE 50 MCG/ACT NA SUSP
2.0000 | Freq: Every day | NASAL | 6 refills | Status: AC
Start: 2023-08-07 — End: ?

## 2023-08-07 NOTE — Patient Instructions (Addendum)
 VISIT SUMMARY:  During today's visit, we discussed your recent onset of excessive sinus drainage and saliva production, as well as your ongoing management of diabetes and hypertension. We reviewed your symptoms and current medications, and made some adjustments to help manage your conditions more effectively.  YOUR PLAN:  -SINUSITIS: Sinusitis is an inflammation of the sinuses that can cause increased drainage and difficulty swallowing. We have prescribed a nasal steroid to help reduce inflammation and drainage, and resent your Zyrtec prescription for daily use to manage your symptoms. Please monitor for any worsening symptoms such as increased facial pain or discolored nasal discharge.   -HYPERTENSION: Hypertension, or high blood pressure, is a condition where the force of the blood against your artery walls is too high. Your home readings have been 132/70s, which is within a manageable range. Please continue with your current management and monitor your blood pressure at home.  Roney Jaffe, PA-C Physician Assistant [facility] [email or web address]  Sinus Infection, Adult A sinus infection, also called sinusitis, is inflammation of your sinuses. Sinuses are hollow spaces in the bones around your face. Your sinuses are located: Around your eyes. In the middle of your forehead. Behind your nose. In your cheekbones. Mucus normally drains out of your sinuses. When your nasal tissues become inflamed or swollen, mucus can become trapped or blocked. This allows bacteria, viruses, and fungi to grow, which leads to infection. Most infections of the sinuses are caused by a virus. A sinus infection can develop quickly. It can last for up to 4 weeks (acute) or for more than 12 weeks (chronic). A sinus infection often develops after a cold. What are the causes? This condition is caused by anything that creates swelling in the sinuses or stops  mucus from draining. This includes: Allergies. Asthma. Infection from bacteria or viruses. Deformities or blockages in your nose or sinuses. Abnormal growths in the nose (nasal polyps). Pollutants, such as chemicals or irritants in the air. Infection from fungi. This is rare. What increases the risk? You are more likely to develop this condition if you: Have a weak body defense system (immune system). Do a lot of swimming or diving. Overuse nasal sprays. Smoke. What are the signs or symptoms? The main symptoms of this condition are pain and a feeling of pressure around the affected sinuses. Other symptoms include: Stuffy nose or congestion that makes it difficult to breathe through your nose. Thick yellow or greenish drainage from your nose. Tenderness, swelling, and warmth over the affected sinuses. A cough that may get worse at night. Decreased sense of smell and taste. Extra mucus that collects in the throat or the back of the nose (postnasal drip) causing a sore throat or bad breath. Tiredness (fatigue). Fever. How is this diagnosed? This condition is diagnosed based on: Your symptoms. Your medical history. A physical exam. Tests to find out if your condition is acute or chronic. This may include: Checking your nose for nasal polyps. Viewing your sinuses using a device that has a light (endoscope). Testing for allergies or bacteria. Imaging tests, such as an MRI or CT scan. In rare cases, a bone biopsy may be done to rule out more serious types of fungal sinus disease. How is this treated? Treatment for a sinus infection depends on the cause and whether your condition is chronic or acute. If caused by a virus, your symptoms should go away on their own within 10 days. You may be given medicines to relieve symptoms. They include:  Medicines that shrink swollen nasal passages (decongestants). A spray that eases inflammation of the nostrils (topical intranasal  corticosteroids). Rinses that help get rid of thick mucus in your nose (nasal saline washes). Medicines that treat allergies (antihistamines). Over-the-counter pain relievers. If caused by bacteria, your health care provider may recommend waiting to see if your symptoms improve. Most bacterial infections will get better without antibiotic medicine. You may be given antibiotics if you have: A severe infection. A weak immune system. If caused by narrow nasal passages or nasal polyps, surgery may be needed. Follow these instructions at home: Medicines Take, use, or apply over-the-counter and prescription medicines only as told by your health care provider. These may include nasal sprays. If you were prescribed an antibiotic medicine, take it as told by your health care provider. Do not stop taking the antibiotic even if you start to feel better. Hydrate and humidify  Drink enough fluid to keep your urine pale yellow. Staying hydrated will help to thin your mucus. Use a cool mist humidifier to keep the humidity level in your home above 50%. Inhale steam for 10-15 minutes, 3-4 times a day, or as told by your health care provider. You can do this in the bathroom while a hot shower is running. Limit your exposure to cool or dry air. Rest Rest as much as possible. Sleep with your head raised (elevated). Make sure you get enough sleep each night. General instructions  Apply a warm, moist washcloth to your face 3-4 times a day or as told by your health care provider. This will help with discomfort. Use nasal saline washes as often as told by your health care provider. Wash your hands often with soap and water to reduce your exposure to germs. If soap and water are not available, use hand sanitizer. Do not smoke. Avoid being around people who are smoking (secondhand smoke). Keep all follow-up visits. This is important. Contact a health care provider if: You have a fever. Your symptoms get  worse. Your symptoms do not improve within 10 days. Get help right away if: You have a severe headache. You have persistent vomiting. You have severe pain or swelling around your face or eyes. You have vision problems. You develop confusion. Your neck is stiff. You have trouble breathing. These symptoms may be an emergency. Get help right away. Call 911. Do not wait to see if the symptoms will go away. Do not drive yourself to the hospital. Summary A sinus infection is soreness and inflammation of your sinuses. Sinuses are hollow spaces in the bones around your face. This condition is caused by nasal tissues that become inflamed or swollen. The swelling traps or blocks the flow of mucus. This allows bacteria, viruses, and fungi to grow, which leads to infection. If you were prescribed an antibiotic medicine, take it as told by your health care provider. Do not stop taking the antibiotic even if you start to feel better. Keep all follow-up visits. This is important. This information is not intended to replace advice given to you by your health care provider. Make sure you discuss any questions you have with your health care provider. Document Revised: 05/17/2021 Document Reviewed: 05/17/2021 Elsevier Patient Education  2024 [employer].

## 2023-08-07 NOTE — Progress Notes (Signed)
Established Patient Office Visit  Subjective   Patient ID: Jose Bishop, male    DOB: November 25, 1965  Age: 58 y.o. MRN: [id number]  Chief Complaint  Patient presents with   Sinus Problem    Drainage    hypersalivation   Discussed the use of AI scribe software for clinical note transcription with the patient, who gave verbal consent to proceed.  History of Present Illness         The patient presents with recent onset of excessive sinus drainage and saliva production. He describes the drainage as being so severe that he had to sleep with a towel on his pillow to manage it. The drainage is described as yellowish-white. He also reports difficulty swallowing due to the excessive saliva. He has tried various home remedies including gargling with warm salt water, peroxide, and Listerine, and taking an over-the-counter sinus tablet, but these have provided only temporary relief. He denies any facial pain, fever, chills, body aches, or recent exposure to sick individuals.  States that he has been checking his BP at home with readings at the upper end of normal.   Past Medical History:  Diagnosis Date   AAA (abdominal aortic aneurysm) (HCC)    Anxiety    Bradycardia    Bradycardia    Difficulty sleeping    Essential hypertension    Family history of stroke    Headache    History of transient ischemic attack (TIA)    15 YRS AGO   Hyperlipidemia    Impotence of organic origin    Liver cyst    Memory loss    Osteoarthritis of acromioclavicular joint    Prostate cancer (HCC)    PVC (premature ventricular contraction)    Type 2 diabetes mellitus with hyperglycemia (HCC)    Social History   Socioeconomic History   Marital status: Married    Spouse name: Not on file   Number of children: 5   Years of education: Not on file   Highest education level: Associate degree: occupational, Scientist, product/process development, or vocational program  Occupational History   Occupation: part time  Tobacco Use    Smoking status: Never    Passive exposure: Never   Smokeless tobacco: Never  Vaping Use   Vaping status: Never Used  Substance and Sexual Activity   Alcohol use: Yes    Alcohol/week: 0.0 standard drinks of alcohol    Comment: OCCASIONAL   Drug use: No   Sexual activity: Yes    Partners: Female  Other Topics Concern   Not on file  Social History Narrative   Not on file   Social Drivers of Health   Financial Resource Strain: Low Risk  (12/06/2022)   Overall Financial Resource Strain (CARDIA)    Difficulty of Paying Living Expenses: Not hard at all  Food Insecurity: No Food Insecurity (12/06/2022)   Hunger Vital Sign    Worried About Running Out of Food in the Last Year: Never true    Ran Out of Food in the Last Year: Never true  Transportation Needs: No Transportation Needs (12/06/2022)   PRAPARE - Administrator, Civil Service (Medical): No    Lack of Transportation (Non-Medical): No  Physical Activity: Insufficiently Active (12/06/2022)   Exercise Vital Sign    Days of Exercise per Week: 3 days    Minutes of Exercise per Session: 30 min  Stress: No Stress Concern Present (12/06/2022)   [employer] of Occupational Health - Occupational Stress Questionnaire  Feeling of Stress : Only a little  Social Connections: Socially Integrated (12/06/2022)   Social Connection and Isolation Panel [NHANES]    Frequency of Communication with Friends and Family: Twice a week    Frequency of Social Gatherings with Friends and Family: Twice a week    Attends Religious Services: More than 4 times per year    Active Member of [employer] or Organizations: Yes    Attends Engineer, structural: More than 4 times per year    Marital Status: Married  Catering manager Violence: Not At Risk (12/06/2022)   Humiliation, Afraid, Rape, and Kick questionnaire    Fear of Current or Ex-Partner: No    Emotionally Abused: No    Physically Abused: No    Sexually Abused: No   Family  History  Problem Relation Age of Onset   Anuerysm Mother    Stroke Mother    Hypertension Father    Peripheral vascular disease Father    Stroke Father    Colon cancer Father        45's   Diabetes Sister    Heart murmur Sister    Heart murmur Son    Stomach cancer Neg Hx    Rectal cancer Neg Hx    Throat cancer Neg Hx    Esophageal cancer Neg Hx    Pancreatic cancer Neg Hx    Allergies  Allergen Reactions   Bee Venom Anaphylaxis   Dapagliflozin Pro-Metformin Er    Lipitor [Atorvastatin]     FATIGUED NAUSEA BRAIN FOG    Review of Systems  Constitutional:  Negative for chills and fever.  HENT:  Positive for congestion. Negative for sinus pain and sore throat.   Eyes: Negative.   Respiratory:  Negative for cough and shortness of breath.   Cardiovascular:  Negative for chest pain.  Gastrointestinal:  Negative for abdominal pain, nausea and vomiting.  Genitourinary: Negative.   Musculoskeletal:  Negative for myalgias.  Skin: Negative.   Neurological:  Negative for headaches.  Endo/Heme/Allergies: Negative.   Psychiatric/Behavioral: Negative.        Objective:     BP (!) 147/77 (BP Location: Left Arm, Patient Position: Sitting, Cuff Size: Large)   Pulse (!) 50   Temp 98.7 F (37.1 C)   Ht 6\' 3"  (1.905 m)   Wt 250 lb (113.4 kg)   SpO2 98%   BMI 31.25 kg/m  BP Readings from Last 3 Encounters:  08/07/23 (!) 147/77  05/28/23 (!) 157/85  05/04/23 108/70   Wt Readings from Last 3 Encounters:  08/07/23 250 lb (113.4 kg)  05/28/23 254 lb (115.2 kg)  05/04/23 249 lb 6.4 oz (113.1 kg)    Physical Exam Vitals and nursing note reviewed.  Constitutional:      Appearance: Normal appearance.  HENT:     Head: Normocephalic and atraumatic.     Salivary Glands: Right salivary gland is not diffusely enlarged or tender. Left salivary gland is not diffusely enlarged or tender.     Right Ear: External ear normal.     Left Ear: External ear normal.     Nose: Nose normal.      Right Turbinates: Enlarged and swollen.     Left Turbinates: Enlarged and swollen.     Right Sinus: No maxillary sinus tenderness or frontal sinus tenderness.     Left Sinus: No maxillary sinus tenderness or frontal sinus tenderness.     Mouth/Throat:     Mouth: Mucous membranes are moist.  Pharynx: Oropharynx is clear. No posterior oropharyngeal erythema.  Eyes:     Extraocular Movements: Extraocular movements intact.     Conjunctiva/sclera: Conjunctivae normal.     Pupils: Pupils are equal, round, and reactive to light.  Cardiovascular:     Rate and Rhythm: Normal rate and regular rhythm.     Pulses: Normal pulses.     Heart sounds: Normal heart sounds.  Pulmonary:     Effort: Pulmonary effort is normal.     Breath sounds: Normal breath sounds.  Musculoskeletal:        General: Normal range of motion.     Cervical back: Normal range of motion and neck supple.  Skin:    General: Skin is warm and dry.  Neurological:     General: No focal deficit present.     Mental Status: He is alert and oriented to person, place, and time.  Psychiatric:        Mood and Affect: Mood normal.        Behavior: Behavior normal.        Thought Content: Thought content normal.        Judgment: Judgment normal.        Assessment & Plan:   Problem List Items Addressed This Visit       Cardiovascular and Mediastinum   Elevated blood pressure reading with diagnosis of hypertension - Primary     Respiratory   Acute non-recurrent maxillary sinusitis   Relevant Medications   fluticasone (FLONASE) 50 MCG/ACT nasal spray   cetirizine (ZYRTEC) 10 MG tablet  1. Acute non-recurrent maxillary sinusitis Trial flonase , zyrtec  patient education given on supportive care.  Red flags given for prompt reevaluation. - fluticasone (FLONASE) 50 MCG/ACT nasal spray; Place 2 sprays into both nostrils daily.  Dispense: 16 g; Refill: 6 - cetirizine (ZYRTEC) 10 MG tablet; Take 1 tablet (10 mg total) by  mouth daily.  Dispense: 30 tablet; Refill: 11  2. Elevated blood pressure reading with diagnosis of hypertension (Primary) Patient encouraged to continue checking blood pressure at home, keeping a written log and having available for all office visits.   I have reviewed the patient's medical history (PMH, PSH, Social History, Family History, Medications, and allergies) , and have been updated if relevant. I spent 30 minutes reviewing chart and  face to face time with patient.     Return if symptoms worsen or fail to improve.    Kasandra Knudsen Mayers, PA-C

## 2023-08-15 ENCOUNTER — Other Ambulatory Visit: Payer: Self-pay | Admitting: Urology

## 2023-08-15 DIAGNOSIS — N503 Cyst of epididymis: Secondary | ICD-10-CM

## 2023-08-31 ENCOUNTER — Ambulatory Visit
Admission: RE | Admit: 2023-08-31 | Discharge: 2023-08-31 | Disposition: A | Discharge status: Home / Self Care | Source: Ambulatory Visit | Attending: Urology | Admitting: Urology

## 2023-08-31 DIAGNOSIS — N503 Cyst of epididymis: Secondary | ICD-10-CM

## 2023-09-13 ENCOUNTER — Encounter: Payer: Self-pay | Admitting: Internal Medicine

## 2023-09-13 ENCOUNTER — Ambulatory Visit: Payer: Medicare Other | Admitting: Internal Medicine

## 2023-09-13 VITALS — BP 120/86 | HR 74 | Ht 75.0 in | Wt 252.6 lb

## 2023-09-13 DIAGNOSIS — Z7984 Long term (current) use of oral hypoglycemic drugs: Secondary | ICD-10-CM

## 2023-09-13 DIAGNOSIS — E782 Mixed hyperlipidemia: Secondary | ICD-10-CM | POA: Diagnosis not present

## 2023-09-13 DIAGNOSIS — E1159 Type 2 diabetes mellitus with other circulatory complications: Secondary | ICD-10-CM

## 2023-09-13 DIAGNOSIS — E1165 Type 2 diabetes mellitus with hyperglycemia: Secondary | ICD-10-CM

## 2023-09-13 LAB — POCT GLYCOSYLATED HEMOGLOBIN (HGB A1C): Hemoglobin A1C: 9.3 % — AB (ref 4.0–5.6)

## 2023-09-13 MED ORDER — EMPAGLIFLOZIN 25 MG PO TABS: 25.0000 mg | ORAL_TABLET | Freq: Every day | ORAL | 3 refills | Status: DC

## 2023-09-13 MED ORDER — ACCU-CHEK SOFTCLIX LANCETS MISC: 3 refills | Status: AC

## 2023-09-13 MED ORDER — FREESTYLE LIBRE 3 SENSOR MISC: 1.0000 | 3 refills | Status: DC

## 2023-09-13 MED ORDER — GLUCOSE BLOOD VI STRP: ORAL_STRIP | 3 refills | Status: AC

## 2023-09-13 MED ORDER — ACCU-CHEK GUIDE W/DEVICE KIT: PACK | 0 refills | Status: AC

## 2023-09-13 MED ORDER — LANTUS SOLOSTAR 100 UNIT/ML ~~LOC~~ SOPN: 20.0000 [IU] | PEN_INJECTOR | Freq: Every day | SUBCUTANEOUS | 99 refills | Status: DC

## 2023-09-13 MED ORDER — METFORMIN HCL 500 MG PO TABS: 500.0000 mg | ORAL_TABLET | Freq: Four times a day (QID) | ORAL | 3 refills | Status: DC

## 2023-09-13 MED ORDER — INSULIN PEN NEEDLE 32G X 4 MM MISC: 3 refills | Status: AC

## 2023-09-13 NOTE — Patient Instructions (Addendum)
 Please continue: - Metformin ER 500 mg 4x daily  Increase: - Lantus 20 units daily  Start back on: - Jardiance 25 mg in am  Please return in 3 months.

## 2023-09-13 NOTE — Progress Notes (Signed)
 Patient ID: Jose Bishop, male   DOB: [birth year], 58 y.o.   MRN: [id number]  HPI: Jose Bishop is a 58 y.o.-year-old male, returning for follow-up for DM2, dx in 2020, insulin-dependent, uncontrolled, with complications (aortic atherosclerosis, history of TIA, AAA, CKD). Pt. previously saw Dr. Everardo All, but last visit with me was 4 mo ago.  Interim history: He denies increased urination, nausea, chest pain.  He changed his diet before last OV. He reduced carbs.  He continues with this and try not to eat between meals.  Reviewed HbA1c: Lab Results  Component Value Date   HGBA1C 7.7 (A) 05/04/2023   HGBA1C 7.6 12/27/2022   HGBA1C 7.8 (A) 09/21/2022   HGBA1C 10.4 (A) 06/01/2022   HGBA1C 8.9 (A) 02/22/2022   HGBA1C 9.6 (A) 07/19/2021   HGBA1C 9.2 (A) 04/18/2021   HGBA1C 9.5 (H) 01/28/2021   HGBA1C 9.6 (A) 10/27/2020   HGBA1C 7.9 (H) 04/14/2020   Previously on: - Metformin ER 500 mg 4x a day >> nausea/dry heaving - Glimepiride 2 mg in a.m. and before dinner - Ozempic 1 mg weekly - prev. off 2/2 doughnut hole (2 mg weekly caused nausea, could not eat meals, only ate snacks) He was on Jardiance for 1 mo >> stopped 2/2 coverage.  We changed to: - Jardiance 25 mg before b'fast - restarted 06/2022 -  >> stopped - Ozempic 0.5 mg weekly - restarted 06/2022 - >> stopped  He ran out Jardiance and Ozempic 1 mo prior to our appointment in 12/2022.  Now on: - Metformin ER 500 - 500 - 1000 mg at bedtime - Lantus 8 >> 10-12 units at bedtime - "whenever I take it" - occasionally >> off >> he restarted it 06/2023: 15 units daily  Pt checks his sugars 4x a day: - am: 135-190 - 2h after b'fast: n/c - lunch: 160-190 - 2h after lunch: n/c - dinner: 135-160 - 2h after dinner: 190 - bedtime: n/c    Previously: - am: 113- 142, 166 - 2h after b'fast: - lunch: n/c >> 111 - 2h after lunch: 119, 218, 244, 247, 290 (w/o Lantus) - dinner: 101 - 2h after dinner: 209 (spaghetti) - bedtime:  n/c  Previously:   Lowest sugar was  60s >> 90 >> 113 >> 60s; ? hypoglycemia awareness.  Highest sugar was 200 >> 300 >> 290 >> 200s. He was on insulin briefly then.  Glucometer: One Touch  Pt's meals are: - Breakfast (2:30-3 am): apple + banana + sunflower seeds, nuts, celery and carrots >> carrots, radishes, celery - Lunch: wraps, keto bread sandwich  - Dinner: meat, starch, veggies; chips >> fish, steamed veggies - Snacks: trailmix  - + CKD, last BUN/creatinine:  Lab Results  Component Value Date   BUN 13 05/28/2023   BUN 17 12/27/2022   CREATININE 1.02 05/28/2023   CREATININE 1.29 (H) 12/27/2022   Lab Results  Component Value Date   MICRALBCREAT 0.4 12/27/2022   MICRALBCREAT <4 06/12/2022   MICRALBCREAT 3 01/06/2020  He is on Lisinopril 30 mg daily.  -+ HL; last set of lipids: Lab Results  Component Value Date   CHOL 229 (H) 12/27/2022   HDL 59.40 12/27/2022   LDLCALC 71 12/21/2021   LDLDIRECT 143.0 12/27/2022   TRIG 226.0 (H) 12/27/2022   CHOLHDL 4 12/27/2022  He Is on Zetia and Repatha.  - last eye exam was on 04/25/2023. No DR. DR. Dione Booze. + cataract.  - no numbness and tingling in his feet.  Last  foot exam was done by Dr. Logan Bores: 11/27/2022: No neuropathy  He also has a history of back pain-disabled due to this, prostate cancer, HTN. He had reconstructive surgery on both shoulders.   ROS: + see HPI  Past Medical History:  Diagnosis Date   AAA (abdominal aortic aneurysm) (HCC)    Anxiety    Bradycardia    Bradycardia    Difficulty sleeping    Essential hypertension    Family history of stroke    Headache    History of transient ischemic attack (TIA)    15 YRS AGO   Hyperlipidemia    Impotence of organic origin    Liver cyst    Memory loss    Osteoarthritis of acromioclavicular joint    Prostate cancer (HCC)    PVC (premature ventricular contraction)    Type 2 diabetes mellitus with hyperglycemia (HCC)    Past Surgical History:  Procedure  Laterality Date   HERNIA REPAIR     X2 ( 1 ING HERNIA / 1 UMBILICAL HERNIA )   LYMPHADENECTOMY Bilateral 01/04/2015   Procedure: BILATERAL LYMPHADENECTOMY;  Surgeon: Heloise Purpura, MD;  Location: [facility];  Service: Urology;  Laterality: Bilateral;   MASS EXCISION     L UPPER ARM   PENILE PROSTHESIS IMPLANT  03/18/2019   ROBOT ASSISTED LAPAROSCOPIC RADICAL PROSTATECTOMY N/A 01/04/2015   Procedure: ROBOTIC ASSISTED LAPAROSCOPIC RADICAL PROSTATECTOMY LEVEL 2;  Surgeon: Heloise Purpura, MD;  Location: [facility];  Service: Urology;  Laterality: N/A;   ROTATOR CUFF REPAIR Bilateral    SURGERY SCROTAL / TESTICULAR     Social History   Socioeconomic History   Marital status: Married    Spouse name: Not on file   Number of children: 5   Years of education: Not on file   Highest education level: Associate degree: occupational, Scientist, product/process development, or vocational program  Occupational History   Occupation: part time  Tobacco Use   Smoking status: Never    Passive exposure: Never   Smokeless tobacco: Never  Vaping Use   Vaping status: Never Used  Substance and Sexual Activity   Alcohol use: Yes    Alcohol/week: 0.0 standard drinks of alcohol    Comment: OCCASIONAL   Drug use: No   Sexual activity: Yes    Partners: Female  Other Topics Concern   Not on file  Social History Narrative   Not on file   Social Drivers of Health   Financial Resource Strain: Low Risk  (12/06/2022)   Overall Financial Resource Strain (CARDIA)    Difficulty of Paying Living Expenses: Not hard at all  Food Insecurity: No Food Insecurity (12/06/2022)   Hunger Vital Sign    Worried About Running Out of Food in the Last Year: Never true    Ran Out of Food in the Last Year: Never true  Transportation Needs: No Transportation Needs (12/06/2022)   PRAPARE - Administrator, Civil Service (Medical): No    Lack of Transportation (Non-Medical): No  Physical Activity: Insufficiently Active (12/06/2022)   Exercise Vital Sign     Days of Exercise per Week: 3 days    Minutes of Exercise per Session: 30 min  Stress: No Stress Concern Present (12/06/2022)   [employer] of Occupational Health - Occupational Stress Questionnaire    Feeling of Stress : Only a little  Social Connections: Socially Integrated (12/06/2022)   Social Connection and Isolation Panel [NHANES]    Frequency of Communication with Friends and Family: Twice a week  Frequency of Social Gatherings with Friends and Family: Twice a week    Attends Religious Services: More than 4 times per year    Active Member of [employer] or Organizations: Yes    Attends Engineer, structural: More than 4 times per year    Marital Status: Married  Catering manager Violence: Not At Risk (12/06/2022)   Humiliation, Afraid, Rape, and Kick questionnaire    Fear of Current or Ex-Partner: No    Emotionally Abused: No    Physically Abused: No    Sexually Abused: No   Current Outpatient Medications on File Prior to Visit  Medication Sig Dispense Refill   APPLE CIDER VINEGAR PO Take by mouth.     BLACK CURRANT SEED OIL PO Take by mouth.     cetirizine (ZYRTEC) 10 MG tablet Take 1 tablet (10 mg total) by mouth daily. 30 tablet 11   chlorthalidone (HYGROTON) 25 MG tablet TAKE 1 TABLET(25 MG) BY MOUTH DAILY 90 tablet 1   CINNAMON PO Take by mouth.     clotrimazole-betamethasone (LOTRISONE) cream Apply 1 Application topically daily. 45 g 2   Continuous Glucose Sensor (FREESTYLE LIBRE 3 SENSOR) MISC 1 each by Does not apply route every 14 (fourteen) days. 6 each 3   Evolocumab (REPATHA SURECLICK) 140 MG/ML SOAJ Inject 140 mg into the skin every 14 (fourteen) days. 6 mL 3   ezetimibe (ZETIA) 10 MG tablet TAKE 1 TABLET(10 MG) BY MOUTH DAILY 90 tablet 0   fluticasone (FLONASE) 50 MCG/ACT nasal spray Place 2 sprays into both nostrils daily. 16 g 6   gabapentin (NEURONTIN) 300 MG capsule TAKE 1 CAPSULE(300 MG) BY MOUTH AT BEDTIME 30 capsule 3   Ginger, Zingiber  officinalis, (GINGER PO) Take by mouth.     glucose blood (ONETOUCH VERIO) test strip 1 each by Other route daily. And lancets 1/day 100 each 3   ibuprofen (ADVIL) 600 MG tablet Take 1 tablet (600 mg total) by mouth every 8 (eight) hours as needed (pain). 15 tablet 0   Insulin Pen Needle 32G X 4 MM MISC Use 1x a day 100 each 3   latanoprost (XALATAN) 0.005 % ophthalmic solution Place 1 drop into both eyes at bedtime.     lisinopril (ZESTRIL) 20 MG tablet TAKE 1 TABLET(30 MG) BY MOUTH DAILY 90 tablet 1   metFORMIN (GLUCOPHAGE) 500 MG tablet Take by mouth 2 (two) times daily with a meal. Take 1 tablet in the morning and 1 tablet at noon, and 2 tablets at bedtime     methocarbamol (ROBAXIN) 500 MG tablet Take 1 tablet (500 mg total) by mouth 2 (two) times daily. 20 tablet 0   omeprazole (PRILOSEC) 40 MG capsule Take 1 capsule (40 mg total) by mouth in the morning. 30 capsule 1   OVER THE COUNTER MEDICATION BEET SUPPLEMENT     TURMERIC PO Take by mouth.     No current facility-administered medications on file prior to visit.   Allergies  Allergen Reactions   Bee Venom Anaphylaxis   Dapagliflozin Pro-Metformin Er    Lipitor [Atorvastatin]     FATIGUED NAUSEA BRAIN FOG   Family History  Problem Relation Age of Onset   Anuerysm Mother    Stroke Mother    Hypertension Father    Peripheral vascular disease Father    Stroke Father    Colon cancer Father        81's   Diabetes Sister    Heart murmur Sister  Heart murmur Son    Stomach cancer Neg Hx    Rectal cancer Neg Hx    Throat cancer Neg Hx    Esophageal cancer Neg Hx    Pancreatic cancer Neg Hx    PE: BP 120/86   Pulse 74   Ht 6\' 3"  (1.905 m)   Wt 252 lb 9.6 oz (114.6 kg)   SpO2 97%   BMI 31.57 kg/m  Wt Readings from Last 3 Encounters:  09/13/23 252 lb 9.6 oz (114.6 kg)  08/07/23 250 lb (113.4 kg)  05/28/23 254 lb (115.2 kg)   Constitutional: overweight, in NAD Eyes:  EOMI, no exophthalmos ENT: no neck masses, no  cervical lymphadenopathy Cardiovascular: RRR, No MRG Respiratory: CTA B Musculoskeletal: no deformities Skin:no rashes Neurological: no tremor with outstretched hands  ASSESSMENT: 1. DM2, non-insulin-dependent, uncontrolled, with complications - Aortic ATC - per CTA 06/30/2022 - AAA - TIA - CKD  Component     Latest Ref Rng 12/27/2022  C-Peptide     0.80 - 3.85 ng/mL 2.84   IA-2 Antibody     <5.4 U/mL <5.4   Glutamic Acid Decarb Ab     <5 IU/mL <5   Znt8 antibody was not drawn, unfortunately.  2. HL  PLAN:  1. Patient with STEMI, uncontrolled, type 2 diabetes, on oral antidiabetic regimen with metformin only.  He was previously on SGLT2 inhibitor, sulfonylurea, weekly GLP-1 receptor agonist and even long-acting insulin but was able to come off, initially due to price but then due to good control.  We checked him for type 1 diabetes and investigation was negative. -At last visit, he was only on metformin and sugars were improved in the few weeks prior to the appointment.  He was off the sensor due to Sport and exercise psychologist.  He was taking Lantus erratically and I advised him to stop and let me know if the sugars increased. CGM interpretation: -At today's visit, we reviewed his CGM downloads from approximately 1 month ago: It appears that 67% of values are in target range (goal >70%), while 33% are higher than 180 (goal <25%), and 0% are lower than 70 (goal <4%).  The calculated average blood sugar is 168.  The projected HbA1c for the next 3 months (GMI) is 7.3%. -Reviewing the CGM trends, sugars appear fluctuating around the upper limit of the target range, between approximately 160 and 200.  He ran out of sensors as they kept coming off so for the last month he did not have the sensor on.  When checking manually, sugars are consistent with the blood sugars checked by CGM, or slightly lower.  However, he does mention that he started back on Lantus approximately 2 months ago as sugars are  staying high.  Unfortunately, he cannot afford Ozempic and Jardiance for now, but he would want me to send Jardiance to the pharmacy to see if it is affordable.  Will do so now.  I will also advised him to increase the dose of insulin and continue to increase until sugars fluctuate around 130 mg/dL target. - I suggested to:  Patient Instructions  Please continue: - Metformin ER 500 mg 4x daily  Increase: - Lantus 20 units daily  Start back on: - Jardiance 25 mg in am  Please return in 3 months.  - we checked his HbA1c: 9.3% (much higher than expected from CGM and sugars at home) - advised to check sugars at different times of the day - 4x a day, rotating check times -  advised for yearly eye exams >> he is UTD - return to clinic in 3-4 months  2. HL Most recent lipid panel shows an increased LDL, double from before, triglycerides were also elevated.  Our target for his LDL is less than 55 due to cardiovascular disease. Lab Results  Component Value Date   CHOL 229 (H) 12/27/2022   HDL 59.40 12/27/2022   LDLCALC 71 12/21/2021   LDLDIRECT 143.0 12/27/2022   TRIG 226.0 (H) 12/27/2022   CHOLHDL 4 12/27/2022  -He is on Zetia 10 mg daily and Repatha 420 mg every 4 weeks without side effects  Carlus Pavlov, MD PhD [facility]

## 2023-09-14 NOTE — Addendum Note (Signed)
 Addended by: Pollie Meyer on: 09/14/2023 09:17 AM   Modules accepted: Orders

## 2023-11-10 ENCOUNTER — Other Ambulatory Visit: Payer: Self-pay

## 2023-11-10 ENCOUNTER — Emergency Department (HOSPITAL_COMMUNITY)

## 2023-11-10 ENCOUNTER — Emergency Department (HOSPITAL_COMMUNITY)
Admission: EM | Admit: 2023-11-10 | Discharge: 2023-11-10 | Disposition: A | Discharge status: Home / Self Care | Attending: Emergency Medicine | Admitting: Emergency Medicine

## 2023-11-10 ENCOUNTER — Encounter (HOSPITAL_COMMUNITY): Payer: Self-pay | Admitting: *Deleted

## 2023-11-10 DIAGNOSIS — T6591XA Toxic effect of unspecified substance, accidental (unintentional), initial encounter: Secondary | ICD-10-CM | POA: Diagnosis present

## 2023-11-10 DIAGNOSIS — E119 Type 2 diabetes mellitus without complications: Secondary | ICD-10-CM | POA: Diagnosis not present

## 2023-11-10 DIAGNOSIS — Z79899 Other long term (current) drug therapy: Secondary | ICD-10-CM | POA: Diagnosis not present

## 2023-11-10 DIAGNOSIS — Z794 Long term (current) use of insulin: Secondary | ICD-10-CM | POA: Insufficient documentation

## 2023-11-10 DIAGNOSIS — Z7984 Long term (current) use of oral hypoglycemic drugs: Secondary | ICD-10-CM | POA: Insufficient documentation

## 2023-11-10 DIAGNOSIS — I10 Essential (primary) hypertension: Secondary | ICD-10-CM | POA: Diagnosis not present

## 2023-11-10 LAB — COMPREHENSIVE METABOLIC PANEL WITH GFR
ALT: 28 U/L (ref 0–44)
AST: 27 U/L (ref 15–41)
Albumin: 3.4 g/dL — ABNORMAL LOW (ref 3.5–5.0)
Alkaline Phosphatase: 48 U/L (ref 38–126)
Anion gap: 6 (ref 5–15)
BUN: 20 mg/dL (ref 6–20)
CO2: 23 mmol/L (ref 22–32)
Calcium: 8.4 mg/dL — ABNORMAL LOW (ref 8.9–10.3)
Chloride: 106 mmol/L (ref 98–111)
Creatinine, Ser: 1.12 mg/dL (ref 0.61–1.24)
GFR, Estimated: >60 mL/min (ref >60)
Glucose, Bld: 209 mg/dL — ABNORMAL HIGH (ref 70–99)
Potassium: 3.6 mmol/L (ref 3.5–5.1)
Sodium: 135 mmol/L (ref 135–145)
Total Bilirubin: 0.7 mg/dL (ref 0.0–1.2)
Total Protein: 6.7 g/dL (ref 6.5–8.1)

## 2023-11-10 LAB — CBC
HCT: 43.7 % (ref 39.0–52.0)
Hemoglobin: 14.3 g/dL (ref 13.0–17.0)
MCH: 30.7 pg (ref 26.0–34.0)
MCHC: 32.7 g/dL (ref 30.0–36.0)
MCV: 93.8 fL (ref 80.0–100.0)
Platelets: 287 10*3/uL (ref 150–400)
RBC: 4.66 MIL/uL (ref 4.22–5.81)
RDW: 12.9 % (ref 11.5–15.5)
WBC: 5.9 10*3/uL (ref 4.0–10.5)
nRBC: 0 % (ref 0.0–0.2)

## 2023-11-10 LAB — TROPONIN I (HIGH SENSITIVITY): Troponin I (High Sensitivity): 6 ng/L (ref <18)

## 2023-11-10 MED ORDER — SODIUM CHLORIDE 0.9 % IV BOLUS
1000.0000 mL | Freq: Once | INTRAVENOUS | Status: AC
  Administered 2023-11-10: 1000 mL via INTRAVENOUS

## 2023-11-10 MED ORDER — CYCLOBENZAPRINE HCL 10 MG PO TABS: 5.0000 mg | ORAL_TABLET | Freq: Every day | ORAL | 0 refills | Status: AC

## 2023-11-10 MED ORDER — PROCHLORPERAZINE EDISYLATE 10 MG/2ML IJ SOLN
10.0000 mg | Freq: Once | INTRAMUSCULAR | Status: AC
  Administered 2023-11-10: 10 mg via INTRAVENOUS
  Filled 2023-11-10: qty 2

## 2023-11-10 NOTE — ED Triage Notes (Signed)
 Pt arrives from home via [facility], called out for bradycardia. Pt c/o weakness when EMS got there, has been nauseated for 2 days. "Couldn't move arms and legs" 12 lead unremarkable. En route hr 50-60's, vitals 118/60, 95%, 145 cbg. [facility].

## 2023-11-10 NOTE — ED Provider Notes (Signed)
 [place] EMERGENCY DEPARTMENT AT [facility] Provider Note  CSN: [id number] Arrival date & time: 11/10/23 0254  Chief Complaint(s) Ingestion and Weakness  HPI Jose Bishop is a 58 y.o. male here with generalized malaise.  Reports associated nausea.  States that he had an edible for the first time earlier this evening.  He began to feel "weird" and "not myself" after eating the edible.  Symptoms were worse after midnight.  Told his wife that he was not feeling well prompting the visit.  No recent fevers or infections.  No cough or congestion.  No shortness of breath.    The history is provided by the patient and the spouse.    Past Medical History Past Medical History:  Diagnosis Date   AAA (abdominal aortic aneurysm) (HCC)    Anxiety    Bradycardia    Bradycardia    Difficulty sleeping    Essential hypertension    Family history of stroke    Headache    History of transient ischemic attack (TIA)    15 YRS AGO   Hyperlipidemia    Impotence of organic origin    Liver cyst    Memory loss    Osteoarthritis of acromioclavicular joint    Prostate cancer (HCC)    PVC (premature ventricular contraction)    Type 2 diabetes mellitus with hyperglycemia (HCC)    Patient Active Problem List   Diagnosis Date Noted   Statin myopathy 06/12/2022   Sciatic nerve pain, left 05/17/2022   Recurrent infection of skin 05/17/2022   GAD (generalized anxiety disorder) 03/08/2022   Acute non-recurrent maxillary sinusitis 05/04/2021   Elevated blood pressure reading with diagnosis of hypertension 10/28/2020   Low back pain 09/27/2020   Neck pain 09/27/2020   DDD (degenerative disc disease), cervical 04/17/2020   Degeneration of lumbar intervertebral disc 04/17/2020   Type 2 diabetes mellitus with hyperglycemia, with long-term current use of insulin  (HCC) 10/06/2019   Benign essential HTN 10/06/2019   Thoracic aortic aneurysm (HCC) 09/16/2019   ED (erectile dysfunction) of  organic origin 01/06/2019   History of repair of rotator cuff 04/16/2018   Hyperlipidemia 04/18/2017   Atypical chest pain 01/24/2017   History of prostate cancer 01/04/2015   History of recurrent TIAs 10/02/2014   Family history of cerebral aneurysm 10/02/2014   Home Medication(s) Prior to Admission medications   Medication Sig Start Date End Date Taking? Authorizing Provider  chlorthalidone  (HYGROTON ) 25 MG tablet TAKE 1 TABLET(25 MG) BY MOUTH DAILY 05/28/23  Yes Newlin, Enobong, MD  diclofenac (VOLTAREN) 75 MG EC tablet Take 1 tablet by mouth 2 (two) times daily with a meal. 07/05/23  Yes [provider]  gabapentin  (NEURONTIN ) 300 MG capsule TAKE 1 CAPSULE(300 MG) BY MOUTH AT BEDTIME 06/01/22  Yes McClung, Angela M, PA-C  lisinopril  (ZESTRIL ) 20 MG tablet TAKE 1 TABLET(30 MG) BY MOUTH DAILY 05/28/23  Yes Newlin, Lavelle Posey, MD  metFORMIN  (GLUCOPHAGE ) 500 MG tablet Take 1 tablet (500 mg total) by mouth 4 (four) times daily. Take 1 tablet in the morning and 1 tablet at noon, and 2 tablets at bedtime 09/13/23  Yes Emilie Harden, MD  ofloxacin (OCUFLOX) 0.3 % ophthalmic solution Place 1 drop into the left eye 4 (four) times daily. 05/16/23  Yes [provider]  Accu-Chek Softclix Lancets lancets Use as instructed 1x a day 09/13/23   Emilie Harden, MD  APPLE CIDER VINEGAR PO Take by mouth.    [provider]  BLACK CURRANT SEED  OIL PO Take by mouth.    [provider]  Blood Glucose Monitoring Suppl (ACCU-CHEK GUIDE) w/Device KIT Use as advised 09/13/23   Emilie Harden, MD  cetirizine  (ZYRTEC ) 10 MG tablet Take 1 tablet (10 mg total) by mouth daily. 08/07/23   Mayers, Cari S, PA-C  CINNAMON PO Take by mouth.    [provider]  clotrimazole -betamethasone  (LOTRISONE ) cream Apply 1 Application topically daily. 11/27/22   Dot Gazella, DPM  Continuous Glucose Sensor (FREESTYLE LIBRE 3 SENSOR) MISC 1 each by Does not apply route every 14 (fourteen)  days. 09/13/23   Emilie Harden, MD  empagliflozin  (JARDIANCE ) 25 MG TABS tablet Take 1 tablet (25 mg total) by mouth daily before breakfast. 09/13/23   Emilie Harden, MD  Evolocumab  (REPATHA  SURECLICK) 140 MG/ML SOAJ Inject 140 mg into the skin every 14 (fourteen) days. Patient not taking: Reported on 09/13/2023 01/02/23   Avanell Leigh, MD  ezetimibe  (ZETIA ) 10 MG tablet TAKE 1 TABLET(10 MG) BY MOUTH DAILY 05/28/23   Newlin, Enobong, MD  fluticasone  (FLONASE ) 50 MCG/ACT nasal spray Place 2 sprays into both nostrils daily. 08/07/23   Mayers, Cari S, PA-C  Ginger, Zingiber officinalis, (GINGER PO) Take by mouth.    [provider]  glucose blood test strip Use as instructed 1x a day 09/13/23   Emilie Harden, MD  ibuprofen  (ADVIL ) 600 MG tablet Take 1 tablet (600 mg total) by mouth every 8 (eight) hours as needed (pain). 06/22/22   Ann Keto, MD  insulin  glargine (LANTUS  SOLOSTAR) 100 UNIT/ML Solostar Pen Inject 20 Units into the skin daily. 09/13/23   Emilie Harden, MD  Insulin  Pen Needle 32G X 4 MM MISC Use 1x a day 09/13/23   Emilie Harden, MD  latanoprost  (XALATAN ) 0.005 % ophthalmic solution Place 1 drop into both eyes at bedtime.    [provider]  methocarbamol  (ROBAXIN ) 500 MG tablet Take 1 tablet (500 mg total) by mouth 2 (two) times daily. 01/26/23   Starlene Eaton, FNP  omeprazole  (PRILOSEC) 40 MG capsule Take 1 capsule (40 mg total) by mouth in the morning. 08/11/22   Vernestine Gondola, PA-C  OVER THE COUNTER MEDICATION BEET SUPPLEMENT    [provider]  TURMERIC PO Take by mouth.    [provider]                                                                                                                                    Allergies Bee venom, Dapagliflozin pro-metformin  er, and Lipitor [atorvastatin ]  Review of Systems Review of Systems As noted in HPI  Physical Exam Vital Signs  I have reviewed the triage vital  signs BP 133/72   Pulse (!) 50   Temp (!) 96.5 F (35.8 C) (Oral)   Resp 14   SpO2 98%   Physical Exam Vitals reviewed.  Constitutional:      General: He  is not in acute distress.    Appearance: He is well-developed. He is not diaphoretic.  HENT:     Head: Normocephalic and atraumatic.     Right Ear: External ear normal.     Left Ear: External ear normal.     Nose: Nose normal.     Mouth/Throat:     Mouth: Mucous membranes are moist.  Eyes:     General: No scleral icterus.    Conjunctiva/sclera: Conjunctivae normal.  Neck:     Trachea: Phonation normal.  Cardiovascular:     Rate and Rhythm: Regular rhythm. Bradycardia present.  Pulmonary:     Effort: Pulmonary effort is normal. No respiratory distress.     Breath sounds: No stridor.  Abdominal:     General: There is no distension.  Musculoskeletal:        General: Normal range of motion.     Cervical back: Normal range of motion.  Neurological:     Mental Status: He is oriented to person, place, and time. He is lethargic.  Psychiatric:        Behavior: Behavior normal.     ED Results and Treatments Labs (all labs ordered are listed, but only abnormal results are displayed) Labs Reviewed  COMPREHENSIVE METABOLIC PANEL WITH GFR - Abnormal; Notable for the following components:      Result Value   Glucose, Bld 209 (*)    Calcium  8.4 (*)    Albumin 3.4 (*)    All other components within normal limits  CBC  TROPONIN I (HIGH SENSITIVITY)                                                                                                                         EKG  EKG Interpretation Date/Time:  Saturday Nov 10 2023 03:08:26 EDT Ventricular Rate:  65 PR Interval:  198 QRS Duration:  89 QT Interval:  444 QTC Calculation: 462 R Axis:   26  Text Interpretation: Sinus rhythm Confirmed by Townsend Freud [phone]) on 11/10/2023 3:24:09 AM       Radiology DG Chest 2 View Result Date: 11/10/2023 CLINICAL DATA:   Bradycardia and chest discomfort, weakness and nausea. EXAM: CHEST - 2 VIEW COMPARISON:  PA Lat chest 06/22/2022 FINDINGS: Limited due to respiratory motion. A beginning subtle infiltrate would be missed. The heart is slightly enlarged. There is central vascular prominence without appreciable edema. No focal dense pneumonia is seen. No substantial pleural effusion. The mediastinum is normally outlined. Thoracic cage is intact. IMPRESSION: 1. Limited due to respiratory motion. A beginning subtle infiltrate would be missed. 2. Mild cardiomegaly with central vascular prominence 3. No appreciable edema or dense infiltrates. Electronically Signed   By: Denman Fischer M.D.   On: 11/10/2023 04:31    Medications Ordered in ED Medications  prochlorperazine (COMPAZINE) injection 10 mg (10 mg Intravenous Given 11/10/23 0341)  sodium chloride  0.9 % bolus 1,000 mL (1,000 mLs Intravenous New Bag/Given 11/10/23 0342)   Procedures Procedures  (  including critical care time) Medical Decision Making / ED Course   Medical Decision Making Amount and/or Complexity of Data Reviewed Labs: ordered. Decision-making details documented in ED Course. Radiology: ordered and independent interpretation performed. Decision-making details documented in ED Course. ECG/medicine tests: ordered and independent interpretation performed. Decision-making details documented in ED Course.  Risk Prescription drug management.    Generalized malaise following delta 8 edible ingestion. EKG without acute ischemic changes or evidence of pericarditis.  No dysrhythmias.  Sinus bradycardia.  Chest x-ray without evidence of pneumonia, pneumothorax, pulmonary edema or pleural effusions.  Troponin negative.  CBC without leukocytosis or anemia.  Metabolic panel without significant electrolyte derangements or renal sufficiency.  Patient provided with IV fluids.  Allowed to metabolize and able to ambulate without complication.    Final Clinical  Impression(s) / ED Diagnoses Final diagnoses:  Ingestion of substance, accidental or unintentional, initial encounter   The patient appears reasonably screened and/or stabilized for discharge and I doubt any other medical condition or other [facility] requiring further screening, evaluation, or treatment in the ED at this time. I have discussed the findings, Dx and Tx plan with the patient/family who expressed understanding and agree(s) with the plan. Discharge instructions discussed at length. The patient/family was given strict return precautions who verbalized understanding of the instructions. No further questions at time of discharge.  Disposition: Discharge  Condition: Good  ED Discharge Orders     None        Follow Up: Joaquin Mulberry, MD [address] [place] [address] [place] [address] [phone]  Call      This chart was dictated using voice recognition software.  Despite best efforts to proofread,  errors can occur which can change the documentation meaning.    Lindle Rhea, MD 11/10/23 [phone]

## 2023-11-10 NOTE — ED Triage Notes (Addendum)
 Pt says that he has been nauseated. He says that today he has had more bowel movements than normal, and they have been "dark". Took diclofenac for pain tonight and then took an edible. He says that his arms and legs feel heavy and his hands feel numb. Pt says that he has been having back pain, but pain was worse tonight- with spasms, and he has had increased fatigue and chest pressure this week

## 2023-11-27 ENCOUNTER — Ambulatory Visit
Admission: EM | Admit: 2023-11-27 | Discharge: 2023-11-27 | Disposition: A | Discharge status: Home / Self Care | Attending: Family Medicine | Admitting: Family Medicine

## 2023-11-27 ENCOUNTER — Telehealth: Payer: Self-pay | Admitting: Family Medicine

## 2023-11-27 DIAGNOSIS — R1310 Dysphagia, unspecified: Secondary | ICD-10-CM

## 2023-11-27 NOTE — Telephone Encounter (Signed)
 Copied from CRM [id number]. Topic: Referral - Request for Referral >> Nov 27, 2023  9:53 AM Alpha Arts wrote:  Did the patient discuss referral with their provider in the last year? Yes (If No - schedule appointment) (If Yes - send message)  Appointment offered? No  Type of order/referral and detailed reason for visit: ENT  Preference of office, provider, location: [facility] area  If referral order, have you been seen by this specialty before? No (If Yes, this issue or another issue? When? Where?  Can we respond through MyChart? Yes

## 2023-11-27 NOTE — Telephone Encounter (Signed)
 Noted

## 2023-11-27 NOTE — ED Provider Notes (Signed)
 [facility] - URGENT CARE CENTER  Note:  This document was prepared using Conservation officer, historic buildings and may include unintentional dictation errors.  MRN: [id number] DOB: 1965/08/13  Subjective:   Jose Bishop is a 58 y.o. male presenting for 2-week history of persistent intermittent dysphagia, sensation of food getting lodged in his esophagus.  No fever, chest pain, nausea, vomiting, diarrhea, constipation, abdominal pain.  Has a history of esophageal dilatation but has not seen his gastroenterologist for just over a year.  No current facility-administered medications for this encounter.  Current Outpatient Medications:    Accu-Chek Softclix Lancets lancets, Use as instructed 1x a day, Disp: 100 each, Rfl: 3   APPLE CIDER VINEGAR PO, Take by mouth., Disp: , Rfl:    BLACK CURRANT SEED OIL PO, Take by mouth., Disp: , Rfl:    Blood Glucose Monitoring Suppl (ACCU-CHEK GUIDE) w/Device KIT, Use as advised, Disp: 1 kit, Rfl: 0   cetirizine  (ZYRTEC ) 10 MG tablet, Take 1 tablet (10 mg total) by mouth daily., Disp: 30 tablet, Rfl: 11   chlorthalidone  (HYGROTON ) 25 MG tablet, TAKE 1 TABLET(25 MG) BY MOUTH DAILY, Disp: 90 tablet, Rfl: 1   CINNAMON PO, Take by mouth., Disp: , Rfl:    clotrimazole -betamethasone  (LOTRISONE ) cream, Apply 1 Application topically daily., Disp: 45 g, Rfl: 2   Continuous Glucose Sensor (FREESTYLE LIBRE 3 SENSOR) MISC, 1 each by Does not apply route every 14 (fourteen) days., Disp: 6 each, Rfl: 3   diclofenac (VOLTAREN) 75 MG EC tablet, Take 1 tablet by mouth 2 (two) times daily with a meal., Disp: , Rfl:    empagliflozin  (JARDIANCE ) 25 MG TABS tablet, Take 1 tablet (25 mg total) by mouth daily before breakfast., Disp: 90 tablet, Rfl: 3   Evolocumab  (REPATHA  SURECLICK) 140 MG/ML SOAJ, Inject 140 mg into the skin every 14 (fourteen) days. (Patient not taking: Reported on 09/13/2023), Disp: 6 mL, Rfl: 3   ezetimibe  (ZETIA ) 10 MG tablet, TAKE 1 TABLET(10 MG) BY MOUTH  DAILY, Disp: 90 tablet, Rfl: 0   fluticasone  (FLONASE ) 50 MCG/ACT nasal spray, Place 2 sprays into both nostrils daily., Disp: 16 g, Rfl: 6   gabapentin  (NEURONTIN ) 300 MG capsule, TAKE 1 CAPSULE(300 MG) BY MOUTH AT BEDTIME, Disp: 30 capsule, Rfl: 3   Ginger, Zingiber officinalis, (GINGER PO), Take by mouth., Disp: , Rfl:    glucose blood test strip, Use as instructed 1x a day, Disp: 100 each, Rfl: 3   ibuprofen  (ADVIL ) 600 MG tablet, Take 1 tablet (600 mg total) by mouth every 8 (eight) hours as needed (pain)., Disp: 15 tablet, Rfl: 0   insulin  glargine (LANTUS  SOLOSTAR) 100 UNIT/ML Solostar Pen, Inject 20 Units into the skin daily., Disp: 15 mL, Rfl: PRN   Insulin  Pen Needle 32G X 4 MM MISC, Use 1x a day, Disp: 100 each, Rfl: 3   latanoprost  (XALATAN ) 0.005 % ophthalmic solution, Place 1 drop into both eyes at bedtime., Disp: , Rfl:    lisinopril  (ZESTRIL ) 20 MG tablet, TAKE 1 TABLET(30 MG) BY MOUTH DAILY, Disp: 90 tablet, Rfl: 1   metFORMIN  (GLUCOPHAGE ) 500 MG tablet, Take 1 tablet (500 mg total) by mouth 4 (four) times daily. Take 1 tablet in the morning and 1 tablet at noon, and 2 tablets at bedtime, Disp: 360 tablet, Rfl: 3   ofloxacin (OCUFLOX) 0.3 % ophthalmic solution, Place 1 drop into the left eye 4 (four) times daily., Disp: , Rfl:    omeprazole  (PRILOSEC) 40 MG capsule, Take 1  capsule (40 mg total) by mouth in the morning., Disp: 30 capsule, Rfl: 1   OVER THE COUNTER MEDICATION, BEET SUPPLEMENT, Disp: , Rfl:    TURMERIC PO, Take by mouth., Disp: , Rfl:    Allergies  Allergen Reactions   Bee Venom Anaphylaxis   Dapagliflozin Pro-Metformin  Er    Lipitor [Atorvastatin ]     FATIGUED NAUSEA BRAIN FOG    Past Medical History:  Diagnosis Date   AAA (abdominal aortic aneurysm) (HCC)    Anxiety    Bradycardia    Bradycardia    Difficulty sleeping    Essential hypertension    Family history of stroke    Headache    History of transient ischemic attack (TIA)    15 YRS AGO    Hyperlipidemia    Impotence of organic origin    Liver cyst    Memory loss    Osteoarthritis of acromioclavicular joint    Prostate cancer (HCC)    PVC (premature ventricular contraction)    Type 2 diabetes mellitus with hyperglycemia (HCC)      Past Surgical History:  Procedure Laterality Date   HERNIA REPAIR     X2 ( 1 ING HERNIA / 1 UMBILICAL HERNIA )   LYMPHADENECTOMY Bilateral 01/04/2015   Procedure: BILATERAL LYMPHADENECTOMY;  Surgeon: Florencio Hunting, MD;  Location: [facility];  Service: Urology;  Laterality: Bilateral;   MASS EXCISION     L UPPER ARM   PENILE PROSTHESIS IMPLANT  03/18/2019   ROBOT ASSISTED LAPAROSCOPIC RADICAL PROSTATECTOMY N/A 01/04/2015   Procedure: ROBOTIC ASSISTED LAPAROSCOPIC RADICAL PROSTATECTOMY LEVEL 2;  Surgeon: Florencio Hunting, MD;  Location: [facility];  Service: Urology;  Laterality: N/A;   ROTATOR CUFF REPAIR Bilateral    SURGERY SCROTAL / TESTICULAR      Family History  Problem Relation Age of Onset   Anuerysm Mother    Stroke Mother    Hypertension Father    Peripheral vascular disease Father    Stroke Father    Colon cancer Father        10's   Diabetes Sister    Heart murmur Sister    Heart murmur Son    Stomach cancer Neg Hx    Rectal cancer Neg Hx    Throat cancer Neg Hx    Esophageal cancer Neg Hx    Pancreatic cancer Neg Hx     Social History   Tobacco Use   Smoking status: Never    Passive exposure: Never   Smokeless tobacco: Never  Vaping Use   Vaping status: Never Used  Substance Use Topics   Alcohol use: Not Currently   Drug use: No    ROS   Objective:   Vitals: BP (!) 163/82 (BP Location: Right Arm)   Pulse (!) 53   Temp 98.5 F (36.9 C) (Oral)   Resp 16   SpO2 97%   Physical Exam Constitutional:      General: He is not in acute distress.    Appearance: Normal appearance. He is well-developed and normal weight. He is not ill-appearing, toxic-appearing or diaphoretic.  HENT:     Head: Normocephalic and  atraumatic.     Right Ear: External ear normal.     Left Ear: External ear normal.     Nose: Nose normal.     Mouth/Throat:     Pharynx: No pharyngeal swelling, oropharyngeal exudate, posterior oropharyngeal erythema or uvula swelling.     Tonsils: No tonsillar exudate or tonsillar abscesses. 0 on the  right. 0 on the left.  Eyes:     General: No scleral icterus.       Right eye: No discharge.        Left eye: No discharge.     Extraocular Movements: Extraocular movements intact.  Cardiovascular:     Rate and Rhythm: Normal rate.  Pulmonary:     Effort: Pulmonary effort is normal.  Musculoskeletal:     Cervical back: Normal range of motion.  Neurological:     Mental Status: He is alert and oriented to person, place, and time.  Psychiatric:        Mood and Affect: Mood normal.        Behavior: Behavior normal.        Thought Content: Thought content normal.        Judgment: Judgment normal.     Assessment and Plan :   PDMP not reviewed this encounter.  1. Dysphagia, unspecified type    Airway is patent, no signs of compromise, epiglottitis, tonsillar abscess, retropharyngeal abscess, pharyngitis.  Recommended follow-up with his GI specialist.     Adolph Hoop, PA-C 11/27/23 1454

## 2023-11-27 NOTE — ED Triage Notes (Signed)
 Pt c/o difficulty swallowing and feeling like food gets stuck x 1-2 weeks- reports hx of having esophagus stretched-NAD-steady gait

## 2023-11-27 NOTE — Telephone Encounter (Signed)
 Copied from CRM [id number]. Topic: Referral - Request for Referral >> Nov 27, 2023  9:53 AM Alpha Arts wrote:  Did the patient discuss referral with their provider in the last year? Yes (If No - schedule appointment) (If Yes - send message)  Appointment offered? No  Type of order/referral and detailed reason for visit: ENT  Preference of office, provider, location: [facility] area  If referral order, have you been seen by this specialty before? No (If Yes, this issue or another issue? When? Where?  Can we respond through MyChart? Yes  >> Nov 27, 2023 12:19 PM Felizardo Hotter wrote: Pt called to cancel request for ENT referral.

## 2023-11-27 NOTE — Discharge Instructions (Signed)
 Please follow up with your GI specialist as soon as possible. If you develop throat swelling, difficulty breathing then please go to the emergency room.

## 2023-11-29 NOTE — Progress Notes (Unsigned)
 Chief Complaint: Primary GI MD:  HPI:  *** is a  ***  who was referred to me by Joaquin Mulberry, MD for a complaint of *** .     Discussed the use of AI scribe software for clinical note transcription with the patient, who gave verbal consent to proceed.  History of Present Illness      PREVIOUS GI WORKUP   EGD 10/25/2021: - Mild, non-specific distal gastritis. Biopsied to check for H. pylori.  - One benign-appearing, intrinsic stenosis was found at the gastroesophageal junction (thin Schatzki's type ring). Dilation with an 18-19-20 mm TTS balloon dilator was performed to 20 mm.  - Small hiatal hernia.  - The examination was otherwise normal. - ANTRAL MUCOSA WITH CHANGES OF REACTIVE GASTROPATHY. - NO HELICOBACTER PYLORI IDENTIFIED.   Colonoscopy 10/25/2021: - Diverticulosis in the left colon. - The examination was otherwise normal on direct and retroflexion views.  - No polyps or cancers. - Recall colonoscopy 5 years   CTAP w constrast 09/2022 unremarkable    Past Medical History:  Diagnosis Date   AAA (abdominal aortic aneurysm) (HCC)    Anxiety    Bradycardia    Bradycardia    Difficulty sleeping    Essential hypertension    Family history of stroke    Headache    History of transient ischemic attack (TIA)    15 YRS AGO   Hyperlipidemia    Impotence of organic origin    Liver cyst    Memory loss    Osteoarthritis of acromioclavicular joint    Prostate cancer (HCC)    PVC (premature ventricular contraction)    Type 2 diabetes mellitus with hyperglycemia (HCC)     Past Surgical History:  Procedure Laterality Date   HERNIA REPAIR     X2 ( 1 ING HERNIA / 1 UMBILICAL HERNIA )   LYMPHADENECTOMY Bilateral 01/04/2015   Procedure: BILATERAL LYMPHADENECTOMY;  Surgeon: Florencio Hunting, MD;  Location: [facility];  Service: Urology;  Laterality: Bilateral;   MASS EXCISION     L UPPER ARM   PENILE PROSTHESIS IMPLANT  03/18/2019   ROBOT ASSISTED LAPAROSCOPIC RADICAL  PROSTATECTOMY N/A 01/04/2015   Procedure: ROBOTIC ASSISTED LAPAROSCOPIC RADICAL PROSTATECTOMY LEVEL 2;  Surgeon: Florencio Hunting, MD;  Location: [facility];  Service: Urology;  Laterality: N/A;   ROTATOR CUFF REPAIR Bilateral    SURGERY SCROTAL / TESTICULAR      Current Outpatient Medications  Medication Sig Dispense Refill   Accu-Chek Softclix Lancets lancets Use as instructed 1x a day 100 each 3   APPLE CIDER VINEGAR PO Take by mouth.     BLACK CURRANT SEED OIL PO Take by mouth.     Blood Glucose Monitoring Suppl (ACCU-CHEK GUIDE) w/Device KIT Use as advised 1 kit 0   cetirizine  (ZYRTEC ) 10 MG tablet Take 1 tablet (10 mg total) by mouth daily. 30 tablet 11   chlorthalidone  (HYGROTON ) 25 MG tablet TAKE 1 TABLET(25 MG) BY MOUTH DAILY 90 tablet 1   CINNAMON PO Take by mouth.     clotrimazole -betamethasone  (LOTRISONE ) cream Apply 1 Application topically daily. 45 g 2   Continuous Glucose Sensor (FREESTYLE LIBRE 3 SENSOR) MISC 1 each by Does not apply route every 14 (fourteen) days. 6 each 3   diclofenac (VOLTAREN) 75 MG EC tablet Take 1 tablet by mouth 2 (two) times daily with a meal.     empagliflozin  (JARDIANCE ) 25 MG TABS tablet Take 1 tablet (25 mg total) by mouth daily before breakfast. 90  tablet 3   Evolocumab  (REPATHA  SURECLICK) 140 MG/ML SOAJ Inject 140 mg into the skin every 14 (fourteen) days. (Patient not taking: Reported on 09/13/2023) 6 mL 3   ezetimibe  (ZETIA ) 10 MG tablet TAKE 1 TABLET(10 MG) BY MOUTH DAILY 90 tablet 0   fluticasone  (FLONASE ) 50 MCG/ACT nasal spray Place 2 sprays into both nostrils daily. 16 g 6   gabapentin  (NEURONTIN ) 300 MG capsule TAKE 1 CAPSULE(300 MG) BY MOUTH AT BEDTIME 30 capsule 3   Ginger, Zingiber officinalis, (GINGER PO) Take by mouth.     glucose blood test strip Use as instructed 1x a day 100 each 3   ibuprofen  (ADVIL ) 600 MG tablet Take 1 tablet (600 mg total) by mouth every 8 (eight) hours as needed (pain). 15 tablet 0   insulin  glargine (LANTUS   SOLOSTAR) 100 UNIT/ML Solostar Pen Inject 20 Units into the skin daily. 15 mL PRN   Insulin  Pen Needle 32G X 4 MM MISC Use 1x a day 100 each 3   latanoprost  (XALATAN ) 0.005 % ophthalmic solution Place 1 drop into both eyes at bedtime.     lisinopril  (ZESTRIL ) 20 MG tablet TAKE 1 TABLET(30 MG) BY MOUTH DAILY 90 tablet 1   metFORMIN  (GLUCOPHAGE ) 500 MG tablet Take 1 tablet (500 mg total) by mouth 4 (four) times daily. Take 1 tablet in the morning and 1 tablet at noon, and 2 tablets at bedtime 360 tablet 3   ofloxacin (OCUFLOX) 0.3 % ophthalmic solution Place 1 drop into the left eye 4 (four) times daily.     omeprazole  (PRILOSEC) 40 MG capsule Take 1 capsule (40 mg total) by mouth in the morning. 30 capsule 1   OVER THE COUNTER MEDICATION BEET SUPPLEMENT     TURMERIC PO Take by mouth.     No current facility-administered medications for this visit.    Allergies as of 11/30/2023 - Review Complete 11/27/2023  Allergen Reaction Noted   Bee venom Anaphylaxis 11/19/2018   Dapagliflozin pro-metformin  er     Lipitor [atorvastatin ]  11/26/2020    Family History  Problem Relation Age of Onset   Anuerysm Mother    Stroke Mother    Hypertension Father    Peripheral vascular disease Father    Stroke Father    Colon cancer Father        18's   Diabetes Sister    Heart murmur Sister    Heart murmur Son    Stomach cancer Neg Hx    Rectal cancer Neg Hx    Throat cancer Neg Hx    Esophageal cancer Neg Hx    Pancreatic cancer Neg Hx     Social History   Socioeconomic History   Marital status: Married    Spouse name: Not on file   Number of children: 5   Years of education: Not on file   Highest education level: Associate degree: occupational, Scientist, product/process development, or vocational program  Occupational History   Occupation: part time  Tobacco Use   Smoking status: Never    Passive exposure: Never   Smokeless tobacco: Never  Vaping Use   Vaping status: Never Used  Substance and Sexual Activity    Alcohol use: Not Currently   Drug use: No   Sexual activity: Yes    Partners: Female  Other Topics Concern   Not on file  Social History Narrative   Not on file   Social Drivers of Health   Financial Resource Strain: Low Risk  (12/06/2022)   Overall Financial  Resource Strain (CARDIA)    Difficulty of Paying Living Expenses: Not hard at all  Food Insecurity: No Food Insecurity (12/06/2022)   Hunger Vital Sign    Worried About Running Out of Food in the Last Year: Never true    Ran Out of Food in the Last Year: Never true  Transportation Needs: No Transportation Needs (12/06/2022)   PRAPARE - Administrator, Civil Service (Medical): No    Lack of Transportation (Non-Medical): No  Physical Activity: Insufficiently Active (12/06/2022)   Exercise Vital Sign    Days of Exercise per Week: 3 days    Minutes of Exercise per Session: 30 min  Stress: No Stress Concern Present (12/06/2022)   [employer] of Occupational Health - Occupational Stress Questionnaire    Feeling of Stress : Only a little  Social Connections: Socially Integrated (12/06/2022)   Social Connection and Isolation Panel [NHANES]    Frequency of Communication with Friends and Family: Twice a week    Frequency of Social Gatherings with Friends and Family: Twice a week    Attends Religious Services: More than 4 times per year    Active Member of [employer] or Organizations: Yes    Attends Engineer, structural: More than 4 times per year    Marital Status: Married  Catering manager Violence: Not At Risk (12/06/2022)   Humiliation, Afraid, Rape, and Kick questionnaire    Fear of Current or Ex-Partner: No    Emotionally Abused: No    Physically Abused: No    Sexually Abused: No    Review of Systems:    Constitutional: No weight loss, fever, chills, weakness or fatigue HEENT: Eyes: No change in vision               Ears, Nose, Throat:  No change in hearing or congestion Skin: No rash or  itching Cardiovascular: No chest pain, chest pressure or palpitations   Respiratory: No SOB or cough Gastrointestinal: See HPI and otherwise negative Genitourinary: No dysuria or change in urinary frequency Neurological: No headache, dizziness or syncope Musculoskeletal: No new muscle or joint pain Hematologic: No bleeding or bruising Psychiatric: No history of depression or anxiety    Physical Exam:  Vital signs: There were no vitals taken for this visit.  Constitutional: NAD, alert and cooperative Head:  Normocephalic and atraumatic. Eyes:   PEERL, EOMI. No icterus. Conjunctiva pink. Respiratory: Respirations even and unlabored. Lungs clear to auscultation bilaterally.   No wheezes, crackles, or rhonchi.  Cardiovascular:  Regular rate and rhythm. No peripheral edema, cyanosis or pallor.  Gastrointestinal:  Soft, nondistended, nontender. No rebound or guarding. Normal bowel sounds. No appreciable masses or hepatomegaly. Rectal:  Declines Msk:  Symmetrical without gross deformities. Without edema, no deformity or joint abnormality.  Neurologic:  Alert and  oriented x4;  grossly normal neurologically.  Skin:   Dry and intact without significant lesions or rashes. Psychiatric: Oriented to person, place and time. Demonstrates good judgement and reason without abnormal affect or behaviors.  Physical Exam    RELEVANT LABS AND IMAGING: CBC    Component Value Date/Time   WBC 5.9 11/10/2023 0337   RBC 4.66 11/10/2023 0337   HGB 14.3 11/10/2023 0337   HGB 14.2 01/07/2021 1602   HCT 43.7 11/10/2023 0337   HCT 42.9 01/07/2021 1602   PLT 287 11/10/2023 0337   PLT 352 01/07/2021 1602   MCV 93.8 11/10/2023 0337   MCV 91 01/07/2021 1602   MCH 30.7 11/10/2023 8657  MCHC 32.7 11/10/2023 0337   RDW 12.9 11/10/2023 0337   RDW 12.8 01/07/2021 1602   LYMPHSABS 3.2 10/19/2022 1609   MONOABS 0.5 10/19/2022 1609   EOSABS 0.5 10/19/2022 1609   BASOSABS 0.0 10/19/2022 1609    CMP      Component Value Date/Time   NA 135 11/10/2023 0337   NA 137 05/28/2023 1653   K 3.6 11/10/2023 0337   CL 106 11/10/2023 0337   CO2 23 11/10/2023 0337   GLUCOSE 209 (H) 11/10/2023 0337   BUN 20 11/10/2023 0337   BUN 13 05/28/2023 1653   CREATININE 1.12 11/10/2023 0337   CREATININE 1.03 10/02/2014 1343   CALCIUM  8.4 (L) 11/10/2023 0337   PROT 6.7 11/10/2023 0337   PROT 6.9 05/28/2023 1653   ALBUMIN 3.4 (L) 11/10/2023 0337   ALBUMIN 3.9 05/28/2023 1653   AST 27 11/10/2023 0337   ALT 28 11/10/2023 0337   ALKPHOS 48 11/10/2023 0337   BILITOT 0.7 11/10/2023 0337   BILITOT 0.3 05/28/2023 1653   GFRNONAA >60 11/10/2023 0337   GFRAA 95 04/14/2020 1024     Assessment/Plan:   Assessment and Plan Assessment & Plan     Colon cancer screening.  Colonoscopy 10/25/2021 showed diverticulosis of left colon without colon polyps.  Father with history of colon cancer the age of 44. -Next colonoscopy due 10/2026  History of prostate cancer     Rawan Riendeau [facility] 11/29/2023, 3:36 PM  Cc: Newlin, Enobong, MD

## 2023-11-30 ENCOUNTER — Encounter: Payer: Self-pay | Admitting: Gastroenterology

## 2023-11-30 ENCOUNTER — Ambulatory Visit: Admitting: Gastroenterology

## 2023-11-30 VITALS — BP 118/60 | HR 56 | Ht 72.5 in | Wt 252.0 lb

## 2023-11-30 DIAGNOSIS — Z8546 Personal history of malignant neoplasm of prostate: Secondary | ICD-10-CM | POA: Diagnosis not present

## 2023-11-30 DIAGNOSIS — Z800 Family history of malignant neoplasm of digestive organs: Secondary | ICD-10-CM | POA: Diagnosis not present

## 2023-11-30 DIAGNOSIS — R1310 Dysphagia, unspecified: Secondary | ICD-10-CM | POA: Diagnosis not present

## 2023-11-30 DIAGNOSIS — Z8719 Personal history of other diseases of the digestive system: Secondary | ICD-10-CM

## 2023-11-30 DIAGNOSIS — K222 Esophageal obstruction: Secondary | ICD-10-CM

## 2023-11-30 DIAGNOSIS — E1165 Type 2 diabetes mellitus with hyperglycemia: Secondary | ICD-10-CM

## 2023-11-30 NOTE — Patient Instructions (Signed)
 You have been scheduled for an endoscopy. Please follow written instructions given to you at your visit today.  If you use inhalers (even only as needed), please bring them with you on the day of your procedure.  If you take any of the following medications, they will need to be adjusted prior to your procedure:   DO NOT TAKE 7 DAYS PRIOR TO TEST- Trulicity (dulaglutide) Ozempic , Wegovy  (semaglutide ) Mounjaro (tirzepatide) Bydureon Bcise (exanatide extended release)  DO NOT TAKE 1 DAY PRIOR TO YOUR TEST Rybelsus  (semaglutide ) Adlyxin (lixisenatide) Victoza (liraglutide) Byetta (exanatide) ___________________________________________________________________________  _______________________________________________________  If your blood pressure at your visit was 140/90 or greater, please contact your primary care physician to follow up on this.  _______________________________________________________  If you are age 40 or older, your body mass index should be between 23-30. Your Body mass index is 33.71 kg/m. If this is out of the aforementioned range listed, please consider follow up with your Primary Care Provider.  If you are age 57 or younger, your body mass index should be between 19-25. Your Body mass index is 33.71 kg/m. If this is out of the aformentioned range listed, please consider follow up with your Primary Care Provider.   ________________________________________________________  The [facility] providers would like to encourage you to use MYCHART to communicate with providers for non-urgent requests or questions.  Due to long hold times on the telephone, sending your provider a message by [facility] may be a faster and more efficient way to get a response.  Please allow 48 business hours for a response.  Please remember that this is for non-urgent requests.  _______________________________________________________

## 2023-11-30 NOTE — Progress Notes (Signed)
 Agree with assessment and plan as outlined.

## 2023-12-14 ENCOUNTER — Encounter: Payer: Self-pay | Admitting: Internal Medicine

## 2023-12-14 ENCOUNTER — Ambulatory Visit: Admitting: Internal Medicine

## 2023-12-14 VITALS — BP 120/64 | HR 49 | Ht 72.5 in | Wt 251.2 lb

## 2023-12-14 DIAGNOSIS — E782 Mixed hyperlipidemia: Secondary | ICD-10-CM | POA: Diagnosis not present

## 2023-12-14 DIAGNOSIS — Z7984 Long term (current) use of oral hypoglycemic drugs: Secondary | ICD-10-CM

## 2023-12-14 DIAGNOSIS — E1165 Type 2 diabetes mellitus with hyperglycemia: Secondary | ICD-10-CM | POA: Diagnosis not present

## 2023-12-14 DIAGNOSIS — E1169 Type 2 diabetes mellitus with other specified complication: Secondary | ICD-10-CM

## 2023-12-14 DIAGNOSIS — E1159 Type 2 diabetes mellitus with other circulatory complications: Secondary | ICD-10-CM | POA: Diagnosis not present

## 2023-12-14 DIAGNOSIS — E785 Hyperlipidemia, unspecified: Secondary | ICD-10-CM

## 2023-12-14 LAB — POCT GLYCOSYLATED HEMOGLOBIN (HGB A1C): Hemoglobin A1C: 8 % — AB (ref 4.0–5.6)

## 2023-12-14 MED ORDER — EZETIMIBE 10 MG PO TABS
ORAL_TABLET | ORAL | 3 refills | Status: AC
Start: 2023-12-14 — End: ?

## 2023-12-14 MED ORDER — GLIPIZIDE ER 5 MG PO TB24: 5.0000 mg | ORAL_TABLET | Freq: Every day | ORAL | 3 refills | Status: AC

## 2023-12-14 NOTE — Patient Instructions (Addendum)
 Please continue: - Metformin  ER 500 mg 1.5 tablet 2x a day  Please add: - Glipizide ER 5 mg before b'fast  Please return in 3-4 months.

## 2023-12-14 NOTE — Progress Notes (Signed)
 Patient ID: Jose Bishop, male   DOB: January 29, 1966, 58 y.o.   MRN: [id number]  HPI: Jose Bishop is a 58 y.o.-year-old male, returning for follow-up for DM2, dx in 2020, insulin -dependent, uncontrolled, with complications (aortic atherosclerosis, history of TIA, AAA, CKD). Pt. previously saw Dr. Washington Hacker, but last visit with me was 3 mo ago.  Interim history: He denies increased urination, nausea, chest pain.  He has dysphagia, has a history of esophageal strictures.  He sees GI. He is on a low protein diet, reducing the intake of starches and processed foods. His daughter is going to grad school.  She will take her MCAT.  Reviewed HbA1c: Lab Results  Component Value Date   HGBA1C 9.3 (A) 09/13/2023   HGBA1C 7.7 (A) 05/04/2023   HGBA1C 7.6 12/27/2022   HGBA1C 7.8 (A) 09/21/2022   HGBA1C 10.4 (A) 06/01/2022   HGBA1C 8.9 (A) 02/22/2022   HGBA1C 9.6 (A) 07/19/2021   HGBA1C 9.2 (A) 04/18/2021   HGBA1C 9.5 (H) 01/28/2021   HGBA1C 9.6 (A) 10/27/2020   Previously on: - Metformin  ER 500 mg 4x a day >> nausea/dry heaving - Glimepiride  2 mg in a.m. and before dinner - Ozempic  1 mg weekly - prev. off 2/2 doughnut hole (2 mg weekly caused nausea, could not eat meals, only ate snacks) He was on Jardiance  for 1 mo >> stopped 2/2 coverage.  We changed to: - Jardiance  25 mg before b'fast - restarted 06/2022 -  >> stopped - Ozempic  0.5 mg weekly - restarted 06/2022 - >> stopped  He ran out Jardiance  and Ozempic  1 mo prior to our appointment in 12/2022.  Now on: - Metformin  ER 500 - 500 - 1000 mg at bedtime >> 1.5 tab 2x a day - Lantus  8 >> 10-12 units at bedtime - occasionally >> off >> restarted it 06/2023: 15 >> 20 units daily >> ran out 1.5 mo ago  Pt checks his sugars 4x a day:    Previously:   Lowest sugar was  90 >> 113 >> 60s; ? hypoglycemia awareness.  Highest sugar was 300 >> 290 >> 200s. He was on insulin  briefly then.  Glucometer: One Touch  Pt's meals are: - Breakfast  (2:30-3 am): apple + banana + sunflower seeds, nuts, celery and carrots >> carrots, radishes, celery - Lunch: wraps, keto bread sandwich  - Dinner: meat, starch, veggies; chips >> fish, steamed veggies - Snacks: trailmix  - + CKD, last BUN/creatinine:  Lab Results  Component Value Date   BUN 20 11/10/2023   BUN 13 05/28/2023   CREATININE 1.12 11/10/2023   CREATININE 1.02 05/28/2023   Lab Results  Component Value Date   MICRALBCREAT 0.4 12/27/2022   MICRALBCREAT <4 06/12/2022   MICRALBCREAT 3 01/06/2020  He is on Lisinopril  30 mg daily.  -+ HL; last set of lipids: Lab Results  Component Value Date   CHOL 229 (H) 12/27/2022   HDL 59.40 12/27/2022   LDLCALC 71 12/21/2021   LDLDIRECT 143.0 12/27/2022   TRIG 226.0 (H) 12/27/2022   CHOLHDL 4 12/27/2022  He was on Zetia  - off for last 2 weeks, and Repatha  - off in the last year 2/2 doughnut hole.  - last eye exam was on 04/25/2023. No DR. DR. Candi Chafe. + cataract.  - no numbness and tingling in his feet.  Last foot exam was done by Dr. Luster Salters: 11/27/2022: No neuropathy.  He also has a history of back pain-disabled due to this, prostate cancer, HTN. He had reconstructive surgery  on both shoulders.   ROS: + see HPI  Past Medical History:  Diagnosis Date   AAA (abdominal aortic aneurysm) (HCC)    Anxiety    Bradycardia    Bradycardia    Difficulty sleeping    Essential hypertension    Family history of stroke    Headache    History of transient ischemic attack (TIA)    15 YRS AGO   Hyperlipidemia    Impotence of organic origin    Liver cyst    Memory loss    Osteoarthritis of acromioclavicular joint    Prostate cancer (HCC)    PVC (premature ventricular contraction)    Type 2 diabetes mellitus with hyperglycemia (HCC)    Past Surgical History:  Procedure Laterality Date   HERNIA REPAIR     X2 ( 1 ING HERNIA / 1 UMBILICAL HERNIA )   LYMPHADENECTOMY Bilateral 01/04/2015   Procedure: BILATERAL LYMPHADENECTOMY;  Surgeon:  Florencio Hunting, MD;  Location: [facility];  Service: Urology;  Laterality: Bilateral;   MASS EXCISION     L UPPER ARM   PENILE PROSTHESIS IMPLANT  03/18/2019   ROBOT ASSISTED LAPAROSCOPIC RADICAL PROSTATECTOMY N/A 01/04/2015   Procedure: ROBOTIC ASSISTED LAPAROSCOPIC RADICAL PROSTATECTOMY LEVEL 2;  Surgeon: Florencio Hunting, MD;  Location: [facility];  Service: Urology;  Laterality: N/A;   ROTATOR CUFF REPAIR Bilateral    SURGERY SCROTAL / TESTICULAR     Social History   Socioeconomic History   Marital status: Married    Spouse name: Not on file   Number of children: 5   Years of education: Not on file   Highest education level: Associate degree: occupational, Scientist, product/process development, or vocational program  Occupational History   Occupation: part time  Tobacco Use   Smoking status: Never    Passive exposure: Never   Smokeless tobacco: Never  Vaping Use   Vaping status: Never Used  Substance and Sexual Activity   Alcohol use: Not Currently   Drug use: No   Sexual activity: Yes    Partners: Female  Other Topics Concern   Not on file  Social History Narrative   Not on file   Social Drivers of Health   Financial Resource Strain: Low Risk  (12/06/2022)   Overall Financial Resource Strain (CARDIA)    Difficulty of Paying Living Expenses: Not hard at all  Food Insecurity: No Food Insecurity (12/06/2022)   Hunger Vital Sign    Worried About Running Out of Food in the Last Year: Never true    Ran Out of Food in the Last Year: Never true  Transportation Needs: No Transportation Needs (12/06/2022)   PRAPARE - Administrator, Civil Service (Medical): No    Lack of Transportation (Non-Medical): No  Physical Activity: Insufficiently Active (12/06/2022)   Exercise Vital Sign    Days of Exercise per Week: 3 days    Minutes of Exercise per Session: 30 min  Stress: No Stress Concern Present (12/06/2022)   [employer] of Occupational Health - Occupational Stress Questionnaire    Feeling of Stress  : Only a little  Social Connections: Socially Integrated (12/06/2022)   Social Connection and Isolation Panel    Frequency of Communication with Friends and Family: Twice a week    Frequency of Social Gatherings with Friends and Family: Twice a week    Attends Religious Services: More than 4 times per year    Active Member of [employer] or Organizations: Yes    Attends Banker  Meetings: More than 4 times per year    Marital Status: Married  Catering manager Violence: Not At Risk (12/06/2022)   Humiliation, Afraid, Rape, and Kick questionnaire    Fear of Current or Ex-Partner: No    Emotionally Abused: No    Physically Abused: No    Sexually Abused: No   Current Outpatient Medications on File Prior to Visit  Medication Sig Dispense Refill   Accu-Chek Softclix Lancets lancets Use as instructed 1x a day 100 each 3   APPLE CIDER VINEGAR PO Take by mouth.     BLACK CURRANT SEED OIL PO Take by mouth.     Blood Glucose Monitoring Suppl (ACCU-CHEK GUIDE) w/Device KIT Use as advised 1 kit 0   cetirizine  (ZYRTEC ) 10 MG tablet Take 1 tablet (10 mg total) by mouth daily. 30 tablet 11   chlorthalidone  (HYGROTON ) 25 MG tablet TAKE 1 TABLET(25 MG) BY MOUTH DAILY 90 tablet 1   CINNAMON PO Take by mouth.     clotrimazole -betamethasone  (LOTRISONE ) cream Apply 1 Application topically daily. 45 g 2   Continuous Glucose Sensor (FREESTYLE LIBRE 3 SENSOR) MISC 1 each by Does not apply route every 14 (fourteen) days. 6 each 3   diclofenac (VOLTAREN) 75 MG EC tablet Take 1 tablet by mouth 2 (two) times daily with a meal.     empagliflozin  (JARDIANCE ) 25 MG TABS tablet Take 1 tablet (25 mg total) by mouth daily before breakfast. 90 tablet 3   Evolocumab  (REPATHA  SURECLICK) 140 MG/ML SOAJ Inject 140 mg into the skin every 14 (fourteen) days. 6 mL 3   ezetimibe  (ZETIA ) 10 MG tablet TAKE 1 TABLET(10 MG) BY MOUTH DAILY 90 tablet 0   fluticasone  (FLONASE ) 50 MCG/ACT nasal spray Place 2 sprays into both  nostrils daily. 16 g 6   gabapentin  (NEURONTIN ) 300 MG capsule TAKE 1 CAPSULE(300 MG) BY MOUTH AT BEDTIME 30 capsule 3   Ginger, Zingiber officinalis, (GINGER PO) Take by mouth.     glucose blood test strip Use as instructed 1x a day 100 each 3   ibuprofen  (ADVIL ) 600 MG tablet Take 1 tablet (600 mg total) by mouth every 8 (eight) hours as needed (pain). 15 tablet 0   insulin  glargine (LANTUS  SOLOSTAR) 100 UNIT/ML Solostar Pen Inject 20 Units into the skin daily. 15 mL PRN   Insulin  Pen Needle 32G X 4 MM MISC Use 1x a day 100 each 3   latanoprost  (XALATAN ) 0.005 % ophthalmic solution Place 1 drop into both eyes at bedtime.     lisinopril  (ZESTRIL ) 20 MG tablet TAKE 1 TABLET(30 MG) BY MOUTH DAILY 90 tablet 1   metFORMIN  (GLUCOPHAGE ) 500 MG tablet Take 1 tablet (500 mg total) by mouth 4 (four) times daily. Take 1 tablet in the morning and 1 tablet at noon, and 2 tablets at bedtime 360 tablet 3   ofloxacin (OCUFLOX) 0.3 % ophthalmic solution Place 1 drop into the left eye 4 (four) times daily.     omeprazole  (PRILOSEC) 40 MG capsule Take 1 capsule (40 mg total) by mouth in the morning. 30 capsule 1   OVER THE COUNTER MEDICATION BEET SUPPLEMENT     Semaglutide ,0.25 or 0.5MG /DOS, (OZEMPIC , 0.25 OR 0.5 MG/DOSE,) 2 MG/3ML SOPN Inject 0.5 Units into the skin once a week.     TURMERIC PO Take by mouth.     No current facility-administered medications on file prior to visit.   Allergies  Allergen Reactions   Bee Venom Anaphylaxis   Dapagliflozin Pro-Metformin  Er  Lipitor [Atorvastatin ]     FATIGUED NAUSEA BRAIN FOG   Statins    Family History  Problem Relation Age of Onset   Anuerysm Mother    Stroke Mother    Hypertension Father    Peripheral vascular disease Father    Stroke Father    Colon cancer Father        34's   Diabetes Sister    Heart murmur Sister    Heart murmur Son    Stomach cancer Neg Hx    Rectal cancer Neg Hx    Throat cancer Neg Hx    Esophageal cancer Neg Hx     Pancreatic cancer Neg Hx    PE: BP 120/64   Pulse (!) 49   Ht 6' 0.5 (1.842 m)   Wt 251 lb 3.2 oz (113.9 kg)   SpO2 98%   BMI 33.60 kg/m  Wt Readings from Last 3 Encounters:  12/14/23 251 lb 3.2 oz (113.9 kg)  11/30/23 252 lb (114.3 kg)  09/13/23 252 lb 9.6 oz (114.6 kg)   Constitutional: overweight, in NAD Eyes:  EOMI, no exophthalmos ENT: no neck masses, no cervical lymphadenopathy Cardiovascular: RRR, No MRG Respiratory: CTA B Musculoskeletal: no deformities Skin:no rashes Neurological: no tremor with outstretched hands Diabetic Foot Exam - Simple   Simple Foot Form Diabetic Foot exam was performed with the following findings: Yes 12/14/2023  2:31 PM  Visual Inspection No deformities, no ulcerations, no other skin breakdown bilaterally: Yes Sensation Testing Intact to touch and monofilament testing bilaterally: Yes Pulse Check Posterior Tibialis and Dorsalis pulse intact bilaterally: Yes Comments    ASSESSMENT: 1. DM2, non-insulin -dependent, uncontrolled, with complications - Aortic ATC - per CTA 06/30/2022 - AAA - TIA - CKD  Component     Latest Ref Rng 12/27/2022  C-Peptide     0.80 - 3.85 ng/mL 2.84   IA-2 Antibody     <5.4 U/mL <5.4   Glutamic Acid Decarb Ab     <5 IU/mL <5   Znt8 antibody was not drawn, unfortunately.  2. HL  PLAN:  1. Patient with longstanding, uncontrolled, type 2 diabetes, on oral antidiabetic regimen with metformin  and previously on long-acting insulin  (but off in the last 1.5 months), to which we tried to add an SGLT2 inhibitor (he was still not able to start due to price).  He mentioned at that time that he had to start back on Lantus  as sugars were high.  I advised him to increase the Lantus  dose as sugars were still high at last visit, fluctuating around the upper limit of the target range, between approximately 160 and 200.  Unfortunately, he could not afford Ozempic  and Jardiance  previously. -At last visit, HbA1c was 9.3%, much  higher than expected from his blood sugars at home CGM interpretation: -At today's visit, we reviewed his CGM downloads: It appears that 77% of values are in target range (goal >70%), while 23% are higher than 180 (goal <25%), and 0% are lower than 70 (goal <4%).  The calculated average blood sugar is 158.  The projected HbA1c for the next 3 months (GMI) is 7.1%. -Reviewing the CGM trends, sugars appear to be fluctuating mainly within the upper range of the target, with higher blood sugars after meals, particularly after larger meals.  For now, we discussed that we do not absolutely need to add back Lantus , but I did suggest better mealtime coverage.  He cannot afford Ozempic  and Jardiance  so for now I suggested a sulfonylurea (extended  release glipizide).  He hopes that he will be able to afford Jardiance  soon and I advised him to let me know.  At that time, we could change from glipizide to Jardiance .  This would definitely be a preferred regimen. -He is splitting metformin  to 1.5 tablets twice a day.  He mentions that when he was taking 2 tablets at the same time, these are causing an upset stomach.  For now we can continue the same regimen. - I suggested to:  Patient Instructions  Please continue: - Metformin  ER 500 mg 1.5 tablet 2x a day  Please add: - Glipizide ER 5 mg before b'fast  Please return in 3-4 months.  - we checked his HbA1c: 8% (lower, but higher than expected from the CGM) - advised to check sugars at different times of the day - 4x a day, rotating check times - advised for yearly eye exams >> he is UTD - will check an ACR today - return to clinic in 3-4 months  2. HL -Most recent lipid panel from 12/2022 showed a high LDL, greatly increased from the year prior, and above our target of less than 55.  Triglycerides were also elevated: Lab Results  Component Value Date   CHOL 229 (H) 12/27/2022   HDL 59.40 12/27/2022   LDLCALC 71 12/21/2021   LDLDIRECT 143.0 12/27/2022    TRIG 226.0 (H) 12/27/2022   CHOLHDL 4 12/27/2022  - He was on Repatha  420 mg every 4 weeks (but came off last year due to the donut hole) and Zetia  10 mg daily (came off 2 weeks ago after running out) without side effects - Will refill his Zetia  for now - He plans to have an annual physical exam with PCP next month.  I advised him to get a lipid panel checked at that time  Emilie Harden, MD PhD [facility] Endocrinology

## 2023-12-14 NOTE — Addendum Note (Signed)
 Addended by: Vernon Goodpasture on: 12/14/2023 04:41 PM   Modules accepted: Orders

## 2023-12-15 LAB — MICROALBUMIN / CREATININE URINE RATIO
Creatinine, Urine: 144 mg/dL (ref 20–320)
Microalb Creat Ratio: 1 mg/g{creat} (ref <30)
Microalb, Ur: 0.2 mg/dL

## 2023-12-17 ENCOUNTER — Ambulatory Visit: Payer: Self-pay | Admitting: Internal Medicine

## 2023-12-18 ENCOUNTER — Other Ambulatory Visit: Payer: Self-pay | Admitting: Internal Medicine

## 2024-01-14 ENCOUNTER — Encounter: Payer: Self-pay | Admitting: Gastroenterology

## 2024-01-14 ENCOUNTER — Ambulatory Visit (AMBULATORY_SURGERY_CENTER): Admitting: Gastroenterology

## 2024-01-14 VITALS — BP 122/86 | HR 59 | Temp 97.9°F | Resp 13 | Ht 72.0 in | Wt 252.0 lb

## 2024-01-14 DIAGNOSIS — K2950 Unspecified chronic gastritis without bleeding: Secondary | ICD-10-CM | POA: Diagnosis not present

## 2024-01-14 DIAGNOSIS — K449 Diaphragmatic hernia without obstruction or gangrene: Secondary | ICD-10-CM

## 2024-01-14 DIAGNOSIS — K2970 Gastritis, unspecified, without bleeding: Secondary | ICD-10-CM

## 2024-01-14 DIAGNOSIS — K222 Esophageal obstruction: Secondary | ICD-10-CM | POA: Diagnosis not present

## 2024-01-14 DIAGNOSIS — Q399 Congenital malformation of esophagus, unspecified: Secondary | ICD-10-CM | POA: Diagnosis not present

## 2024-01-14 DIAGNOSIS — R1310 Dysphagia, unspecified: Secondary | ICD-10-CM | POA: Diagnosis not present

## 2024-01-14 MED ORDER — SODIUM CHLORIDE 0.9 % IV SOLN: 500.0000 mL | Freq: Once | INTRAVENOUS | Status: DC

## 2024-01-14 NOTE — Progress Notes (Signed)
 [facility] History and Physical   Primary Care Physician:  Delbert Clam, MD   Reason for Procedure:   dysphagia  Plan:    EGD     HPI: Jose Bishop is a 58 y.o. male  here for EGD to evaluate dysphagia. History of EGD in 2023 with Dr. Teressa - had a mild Shatski ring at the GEJ dilated to 20mm. On omeprazole  40mg  but only takes PRN, not routinely. Reflux does not bother him much.  Otherwise feels well without any cardiopulmonary symptoms. No other specific complaints.   I have discussed risks / benefits of anesthesia and endoscopic procedure with Jose Bishop and they wish to proceed with the exams as outlined today.    Past Medical History:  Diagnosis Date   AAA (abdominal aortic aneurysm) (HCC)    Anxiety    Bradycardia    Bradycardia    Difficulty sleeping    Essential hypertension    Family history of stroke    Headache    History of transient ischemic attack (TIA)    15 YRS AGO   Hyperlipidemia    Impotence of organic origin    Liver cyst    Memory loss    Osteoarthritis of acromioclavicular joint    Prostate cancer (HCC)    PVC (premature ventricular contraction)    Type 2 diabetes mellitus with hyperglycemia (HCC)     Past Surgical History:  Procedure Laterality Date   HERNIA REPAIR     X2 ( 1 ING HERNIA / 1 UMBILICAL HERNIA )   LYMPHADENECTOMY Bilateral 01/04/2015   Procedure: BILATERAL LYMPHADENECTOMY;  Surgeon: Gretel Ferrara, MD;  Location: [facility];  Service: Urology;  Laterality: Bilateral;   MASS EXCISION     L UPPER ARM   PENILE PROSTHESIS IMPLANT  03/18/2019   ROBOT ASSISTED LAPAROSCOPIC RADICAL PROSTATECTOMY N/A 01/04/2015   Procedure: ROBOTIC ASSISTED LAPAROSCOPIC RADICAL PROSTATECTOMY LEVEL 2;  Surgeon: Gretel Ferrara, MD;  Location: [facility];  Service: Urology;  Laterality: N/A;   ROTATOR CUFF REPAIR Bilateral    SURGERY SCROTAL / TESTICULAR      Prior to Admission medications   Medication Sig Start Date End Date Taking?  Authorizing Provider  APPLE CIDER VINEGAR PO Take by mouth.   Yes [provider]  BLACK CURRANT SEED OIL PO Take by mouth.   Yes [provider]  chlorthalidone  (HYGROTON ) 25 MG tablet TAKE 1 TABLET(25 MG) BY MOUTH DAILY 05/28/23  Yes Newlin, Enobong, MD  Continuous Glucose Sensor (FREESTYLE LIBRE 3 SENSOR) MISC APPLY EVERY 14 DAYS 12/18/23  Yes Trixie File, MD  ezetimibe  (ZETIA ) 10 MG tablet TAKE 1 TABLET(10 MG) BY MOUTH DAILY 12/14/23  Yes Trixie File, MD  Ginger, Zingiber officinalis, (GINGER PO) Take by mouth.   Yes [provider]  glipiZIDE  (GLUCOTROL  XL) 5 MG 24 hr tablet Take 1 tablet (5 mg total) by mouth daily with breakfast. 12/14/23  Yes Trixie File, MD  latanoprost  (XALATAN ) 0.005 % ophthalmic solution Place 1 drop into both eyes at bedtime.   Yes [provider]  lisinopril  (ZESTRIL ) 20 MG tablet TAKE 1 TABLET(30 MG) BY MOUTH DAILY 05/28/23  Yes Newlin, Clam, MD  metFORMIN  (GLUCOPHAGE -XR) 500 MG 24 hr tablet Take 500 mg by mouth in the morning, at noon, in the evening, and at bedtime.   Yes [provider]  OVER THE COUNTER MEDICATION BEET SUPPLEMENT   Yes [provider]  Accu-Chek Softclix Lancets lancets Use as instructed 1x a day 09/13/23  Trixie File, MD  Blood Glucose Monitoring Suppl (ACCU-CHEK GUIDE) w/Device KIT Use as advised 09/13/23   Trixie File, MD  cetirizine  (ZYRTEC ) 10 MG tablet Take 1 tablet (10 mg total) by mouth daily. 08/07/23   Mayers, Cari S, PA-C  CINNAMON PO Take by mouth.    [provider]  clotrimazole -betamethasone  (LOTRISONE ) cream Apply 1 Application topically daily. 11/27/22   Janit Thresa HERO, DPM  diclofenac (VOLTAREN) 75 MG EC tablet Take 1 tablet by mouth 2 (two) times daily with a meal. 07/05/23   [provider]  fluticasone  (FLONASE ) 50 MCG/ACT nasal spray Place 2 sprays into both nostrils daily. 08/07/23   Mayers, Cari S, PA-C  gabapentin  (NEURONTIN ) 300  MG capsule TAKE 1 CAPSULE(300 MG) BY MOUTH AT BEDTIME 06/01/22   Danton Slough M, PA-C  glucose blood test strip Use as instructed 1x a day 09/13/23   Trixie File, MD  ibuprofen  (ADVIL ) 600 MG tablet Take 1 tablet (600 mg total) by mouth every 8 (eight) hours as needed (pain). 06/22/22   Vonna Sharlet POUR, MD  Insulin  Pen Needle 32G X 4 MM MISC Use 1x a day 09/13/23   Trixie File, MD  omeprazole  (PRILOSEC) 40 MG capsule Take 1 capsule (40 mg total) by mouth in the morning. 08/11/22   Billy Asberry FALCON, PA-C  Semaglutide ,0.25 or 0.5MG /DOS, (OZEMPIC , 0.25 OR 0.5 MG/DOSE,) 2 MG/3ML SOPN Inject 0.5 Units into the skin once a week. Patient not taking: No sig reported    [provider]    Current Outpatient Medications  Medication Sig Dispense Refill   APPLE CIDER VINEGAR PO Take by mouth.     BLACK CURRANT SEED OIL PO Take by mouth.     chlorthalidone  (HYGROTON ) 25 MG tablet TAKE 1 TABLET(25 MG) BY MOUTH DAILY 90 tablet 1   Continuous Glucose Sensor (FREESTYLE LIBRE 3 SENSOR) MISC APPLY EVERY 14 DAYS 6 each 3   ezetimibe  (ZETIA ) 10 MG tablet TAKE 1 TABLET(10 MG) BY MOUTH DAILY 90 tablet 3   Ginger, Zingiber officinalis, (GINGER PO) Take by mouth.     glipiZIDE  (GLUCOTROL  XL) 5 MG 24 hr tablet Take 1 tablet (5 mg total) by mouth daily with breakfast. 90 tablet 3   latanoprost  (XALATAN ) 0.005 % ophthalmic solution Place 1 drop into both eyes at bedtime.     lisinopril  (ZESTRIL ) 20 MG tablet TAKE 1 TABLET(30 MG) BY MOUTH DAILY 90 tablet 1   metFORMIN  (GLUCOPHAGE -XR) 500 MG 24 hr tablet Take 500 mg by mouth in the morning, at noon, in the evening, and at bedtime.     OVER THE COUNTER MEDICATION BEET SUPPLEMENT     Accu-Chek Softclix Lancets lancets Use as instructed 1x a day 100 each 3   Blood Glucose Monitoring Suppl (ACCU-CHEK GUIDE) w/Device KIT Use as advised 1 kit 0   cetirizine  (ZYRTEC ) 10 MG tablet Take 1 tablet (10 mg total) by mouth daily. 30 tablet 11   CINNAMON PO Take  by mouth.     clotrimazole -betamethasone  (LOTRISONE ) cream Apply 1 Application topically daily. 45 g 2   diclofenac (VOLTAREN) 75 MG EC tablet Take 1 tablet by mouth 2 (two) times daily with a meal.     fluticasone  (FLONASE ) 50 MCG/ACT nasal spray Place 2 sprays into both nostrils daily. 16 g 6   gabapentin  (NEURONTIN ) 300 MG capsule TAKE 1 CAPSULE(300 MG) BY MOUTH AT BEDTIME 30 capsule 3   glucose blood test strip Use as instructed 1x a day 100 each 3  ibuprofen  (ADVIL ) 600 MG tablet Take 1 tablet (600 mg total) by mouth every 8 (eight) hours as needed (pain). 15 tablet 0   Insulin  Pen Needle 32G X 4 MM MISC Use 1x a day 100 each 3   omeprazole  (PRILOSEC) 40 MG capsule Take 1 capsule (40 mg total) by mouth in the morning. 30 capsule 1   Semaglutide ,0.25 or 0.5MG /DOS, (OZEMPIC , 0.25 OR 0.5 MG/DOSE,) 2 MG/3ML SOPN Inject 0.5 Units into the skin once a week. (Patient not taking: No sig reported)     Current Facility-Administered Medications  Medication Dose Route Frequency Provider Last Rate Last Admin   0.9 %  sodium chloride  infusion  500 mL Intravenous Once Milady Fleener, Elspeth SQUIBB, MD        Allergies as of 01/14/2024 - Review Complete 01/14/2024  Allergen Reaction Noted   Bee venom Anaphylaxis 11/19/2018   Lipitor [atorvastatin ] Other (See Comments) 11/26/2020   Statins Nausea And Vomiting and Other (See Comments) 11/30/2023   Dapagliflozin pro-metformin  er Other (See Comments)     Family History  Problem Relation Age of Onset   Anuerysm Mother    Stroke Mother    Hypertension Father    Peripheral vascular disease Father    Stroke Father    Colon cancer Father        58's   Diabetes Sister    Heart murmur Sister    Heart murmur Son    Stomach cancer Neg Hx    Rectal cancer Neg Hx    Throat cancer Neg Hx    Esophageal cancer Neg Hx    Pancreatic cancer Neg Hx     Social History   Socioeconomic History   Marital status: Married    Spouse name: Not on file   Number of  children: 5   Years of education: Not on file   Highest education level: Associate degree: occupational, Scientist, product/process development, or vocational program  Occupational History   Occupation: part time  Tobacco Use   Smoking status: Never    Passive exposure: Never   Smokeless tobacco: Never  Vaping Use   Vaping status: Never Used  Substance and Sexual Activity   Alcohol use: Not Currently   Drug use: No   Sexual activity: Yes    Partners: Female  Other Topics Concern   Not on file  Social History Narrative   Not on file   Social Drivers of Health   Financial Resource Strain: Low Risk  (12/06/2022)   Overall Financial Resource Strain (CARDIA)    Difficulty of Paying Living Expenses: Not hard at all  Food Insecurity: No Food Insecurity (12/06/2022)   Hunger Vital Sign    Worried About Running Out of Food in the Last Year: Never true    Ran Out of Food in the Last Year: Never true  Transportation Needs: No Transportation Needs (12/06/2022)   PRAPARE - Administrator, Civil Service (Medical): No    Lack of Transportation (Non-Medical): No  Physical Activity: Insufficiently Active (12/06/2022)   Exercise Vital Sign    Days of Exercise per Week: 3 days    Minutes of Exercise per Session: 30 min  Stress: No Stress Concern Present (12/06/2022)   [employer] of Occupational Health - Occupational Stress Questionnaire    Feeling of Stress : Only a little  Social Connections: Socially Integrated (12/06/2022)   Social Connection and Isolation Panel    Frequency of Communication with Friends and Family: Twice a week    Frequency of  Social Gatherings with Friends and Family: Twice a week    Attends Religious Services: More than 4 times per year    Active Member of [employer] or Organizations: Yes    Attends Engineer, structural: More than 4 times per year    Marital Status: Married  Catering manager Violence: Not At Risk (12/06/2022)   Humiliation, Afraid, Rape, and Kick questionnaire     Fear of Current or Ex-Partner: No    Emotionally Abused: No    Physically Abused: No    Sexually Abused: No    Review of Systems: All other review of systems negative except as mentioned in the HPI.  Physical Exam: Vital signs BP 127/78   Pulse (!) 50   Temp 97.9 F (36.6 C) (Skin)   Ht 6' (1.829 m)   Wt 252 lb (114.3 kg)   SpO2 98%   BMI 34.18 kg/m   General:   Alert,  Well-developed, pleasant and cooperative in NAD Lungs:  Clear throughout to auscultation.   Heart:  Regular rate and rhythm Abdomen:  Soft, nontender and nondistended.   Neuro/Psych:  Alert and cooperative. Normal mood and affect. A and O x 3  Marcey Naval, MD [facility]

## 2024-01-14 NOTE — Patient Instructions (Signed)
 YOU HAD AN ENDOSCOPIC PROCEDURE TODAY AT THE [facility]:   Refer to the procedure report that was given to you for any specific questions about what was found during the examination.  If the procedure report does not answer your questions, please call your gastroenterologist to clarify.  If you requested that your care partner not be given the details of your procedure findings, then the procedure report has been included in a sealed envelope for you to review at your convenience later.  **Handouts given on stricture and gastritis**  YOU SHOULD EXPECT: Some feelings of bloating in the abdomen. Passage of more gas than usual.  Walking can help get rid of the air that was put into your GI tract during the procedure and reduce the bloating. If you had a lower endoscopy (such as a colonoscopy or flexible sigmoidoscopy) you may notice spotting of blood in your stool or on the toilet paper. If you underwent a bowel prep for your procedure, you may not have a normal bowel movement for a few days.  Please Note:  You might notice some irritation and congestion in your nose or some drainage.  This is from the oxygen used during your procedure.  There is no need for concern and it should clear up in a day or so.  SYMPTOMS TO REPORT IMMEDIATELY:  Following upper endoscopy (EGD)  Vomiting of blood or coffee ground material  New chest pain or pain under the shoulder blades  Painful or persistently difficult swallowing  New shortness of breath  Fever of 100F or higher  Black, tarry-looking stools  For urgent or emergent issues, a gastroenterologist can be reached at any hour by calling [phone]. Do not use MyChart messaging for urgent concerns.    DIET:  We do recommend a small meal at first, but then you may proceed to your regular diet.  Drink plenty of fluids but you should avoid alcoholic beverages for 24 hours.  ACTIVITY:  You should plan to take it easy for the rest of today and  you should NOT DRIVE or use heavy machinery until tomorrow (because of the sedation medicines used during the test).    FOLLOW UP: Our staff will call the number listed on your records the next business day following your procedure.  We will call around 7:15- 8:00 am to check on you and address any questions or concerns that you may have regarding the information given to you following your procedure. If we do not reach you, we will leave a message.     If any biopsies were taken you will be contacted by phone or by letter within the next 1-3 weeks.  Please call us  at [phone] if you have not heard about the biopsies in 3 weeks.    SIGNATURES/CONFIDENTIALITY: You and/or your care partner have signed paperwork which will be entered into your electronic medical record.  These signatures attest to the fact that that the information above on your After Visit Summary has been reviewed and is understood.  Full responsibility of the confidentiality of this discharge information lies with you and/or your care-partner.

## 2024-01-14 NOTE — Progress Notes (Signed)
VS by DT    

## 2024-01-14 NOTE — Progress Notes (Signed)
 To PACU via stretcher, sedated, good respiratory effort, VSS.

## 2024-01-14 NOTE — Op Note (Signed)
 [facility] Patient Name: Jose Bishop Procedure Date: 01/14/2024 12:58 PM MRN: [id number] Endoscopist: Elspeth P. Leigh , MD, [id number] Age: 58 Referring MD:  Date of Birth: October 03, 1965 Gender: Male Account #: [id number] Procedure:                Upper GI endoscopy Indications:              Dysphagia - history of GEJ stricture dilated in                            2023 with Dr. Teressa, good response to dilation at                            the time. On omeprazole  PRN for GERD. Medicines:                Monitored Anesthesia Care Procedure:                Pre-Anesthesia Assessment:                           - Prior to the procedure, a History and Physical                            was performed, and patient medications and                            allergies were reviewed. The patient's tolerance of                            previous anesthesia was also reviewed. The risks                            and benefits of the procedure and the sedation                            options and risks were discussed with the patient.                            All questions were answered, and informed consent                            was obtained. Prior Anticoagulants: The patient has                            taken no anticoagulant or antiplatelet agents. ASA                            Grade Assessment: III - A patient with severe                            systemic disease. After reviewing the risks and                            benefits, the patient was deemed in satisfactory  condition to undergo the procedure.                           After obtaining informed consent, the endoscope was                            passed under direct vision. Throughout the                            procedure, the patient's blood pressure, pulse, and                            oxygen saturations were monitored continuously. The                            Olympus  Scope [id number] was introduced through the                            mouth, and advanced to the second part of duodenum.                            The upper GI endoscopy was accomplished without                            difficulty. The patient tolerated the procedure                            well. Scope In: Scope Out: Findings:                 Esophagogastric landmarks were identified: the                            Z-line was found at 38 cm, the gastroesophageal                            junction was found at 38 cm and the upper extent of                            the gastric folds was found at 39 cm from the                            incisors.                           A 1 cm hiatal hernia was present.                           One suspected benign-appearing, intrinsic very mild                            stenosis was found 38 cm from the incisors at the                            GEJ. A TTS  dilator was passed through the scope.                            Dilation with an 18-19-20 mm balloon dilator was                            performed to 18 mm, 19 mm and 20 mm.                           There was some tortousity of the distal esophagus.                            The exam of the esophagus was otherwise normal.                           Patchy mildly erythematous mucosa was found in the                            gastric antrum.                           The exam of the stomach was otherwise normal.                           Biopsies were taken with a cold forceps for                            Helicobacter pylori testing.                           The examined duodenum was normal. Complications:            No immediate complications. Estimated blood loss:                            Minimal. Estimated Blood Loss:     Estimated blood loss was minimal. Impression:               - Esophagogastric landmarks identified.                           - 1 cm hiatal hernia.                            - Benign-appearing esophageal stenosis. Dilated to                            20mm.                           - Tortous distal esophagus.                           - Normal esophagus otherwise.                           - Erythematous mucosa in the antrum.                           -  Normal stomach otherwise - biopsies taken to rule                            out H pylori                           - Normal examined duodenum. Recommendation:           - Patient has a contact number available for                            emergencies. The signs and symptoms of potential                            delayed complications were discussed with the                            patient. Return to normal activities tomorrow.                            Written discharge instructions were provided to the                            patient.                           - Resume previous diet.                           - Continue present medications.                           - Await pathology results and course post dilation                           - If dysphagia persists post dilation then consider                            barium study to evaluate motility. Elspeth P. Christropher Gintz, MD 01/14/2024 1:47:02 PM This report has been signed electronically.

## 2024-01-14 NOTE — Progress Notes (Signed)
 Called to room to assist during endoscopic procedure.  Patient ID and intended procedure confirmed with present staff. Received instructions for my participation in the procedure from the performing physician.

## 2024-01-15 ENCOUNTER — Telehealth: Payer: Self-pay | Admitting: *Deleted

## 2024-01-15 NOTE — Telephone Encounter (Signed)
  Follow up Call-     01/14/2024   12:54 PM 10/25/2021    2:01 PM  Call back number  Post procedure Call Back phone  # [phone]  Permission to leave phone message Yes Yes     Patient questions:  Do you have a fever, pain , or abdominal swelling? No. Pain Score  0 *  Have you tolerated food without any problems? Yes.    Have you been able to return to your normal activities? Yes.    Do you have any questions about your discharge instructions: Diet   No. Medications  No. Follow up visit  No.  Do you have questions or concerns about your Care? No.  Actions: * If pain score is 4 or above: No action needed, pain <4.

## 2024-01-17 LAB — SURGICAL PATHOLOGY

## 2024-01-18 ENCOUNTER — Ambulatory Visit: Payer: Self-pay | Admitting: Gastroenterology

## 2024-01-25 ENCOUNTER — Encounter: Admitting: Gastroenterology

## 2024-02-19 ENCOUNTER — Telehealth: Payer: Self-pay | Admitting: Internal Medicine

## 2024-02-19 ENCOUNTER — Ambulatory Visit: Attending: Family Medicine | Admitting: Family Medicine

## 2024-02-19 ENCOUNTER — Encounter: Payer: Self-pay | Admitting: Family Medicine

## 2024-02-19 VITALS — BP 114/71 | HR 55 | Ht 72.0 in | Wt 256.0 lb

## 2024-02-19 DIAGNOSIS — Z7984 Long term (current) use of oral hypoglycemic drugs: Secondary | ICD-10-CM

## 2024-02-19 DIAGNOSIS — F419 Anxiety disorder, unspecified: Secondary | ICD-10-CM

## 2024-02-19 DIAGNOSIS — E785 Hyperlipidemia, unspecified: Secondary | ICD-10-CM

## 2024-02-19 DIAGNOSIS — M25551 Pain in right hip: Secondary | ICD-10-CM

## 2024-02-19 DIAGNOSIS — Z8546 Personal history of malignant neoplasm of prostate: Secondary | ICD-10-CM

## 2024-02-19 DIAGNOSIS — E1159 Type 2 diabetes mellitus with other circulatory complications: Secondary | ICD-10-CM

## 2024-02-19 DIAGNOSIS — M545 Low back pain, unspecified: Secondary | ICD-10-CM

## 2024-02-19 DIAGNOSIS — E1169 Type 2 diabetes mellitus with other specified complication: Secondary | ICD-10-CM

## 2024-02-19 DIAGNOSIS — I152 Hypertension secondary to endocrine disorders: Secondary | ICD-10-CM

## 2024-02-19 DIAGNOSIS — G720 Drug-induced myopathy: Secondary | ICD-10-CM

## 2024-02-19 DIAGNOSIS — M25552 Pain in left hip: Secondary | ICD-10-CM

## 2024-02-19 DIAGNOSIS — I7121 Aneurysm of the ascending aorta, without rupture: Secondary | ICD-10-CM

## 2024-02-19 MED ORDER — LISINOPRIL 20 MG PO TABS: ORAL_TABLET | ORAL | 1 refills | Status: AC

## 2024-02-19 NOTE — Telephone Encounter (Signed)
 Patient is in office today stating that starting 03/08/2024 he will be getting cortisone injections for hip and back pain.  Patient states that he knows that this will cause his blood sugar level to go up and wants to know what to do if and when this happens.

## 2024-02-19 NOTE — Patient Instructions (Signed)
 VISIT SUMMARY:  You came in today to discuss a referral for therapy due to anxiety and depression. We also reviewed your ongoing health issues, including diabetes, hypertension, chronic pain, high cholesterol, and your history of prostate cancer.  YOUR PLAN:  -DEPRESSION AND ANXIETY: You are experiencing increased anxiety and frustration related to financial concerns and retirement planning. We will refer you to behavioral health for therapy to help manage your anxiety and depression.  -CHRONIC BACK AND HIP PAIN WITH BILATERAL HIP BURSITIS: You have persistent pain in your back and hips, which is worsened by previous injuries. You are scheduled for injections in your lower back and hips on September 13th. After the injections, we may consider physical therapy, water  therapy, or yoga to help manage your pain.  -TYPE 2 DIABETES MELLITUS: Your blood sugar levels are not well-controlled, with an A1c of 8.0% in June. We will monitor your blood glucose levels after your cortisone injections and ensure that your metformin  is dispensed as the extended-release form.  -ESSENTIAL HYPERTENSION: Your blood pressure is well-controlled with lisinopril . We will discontinue chlorthalidone  and continue lisinopril  20 mg daily. Please monitor your blood pressure at home, aiming for a target of less than 130/80 mmHg, and report if it exceeds this target.  -HYPERLIPIDEMIA WITH STATIN-INDUCED MYOPATHY: You have high cholesterol and cannot tolerate statins. You are currently on Zetia . We need to re-evaluate your cholesterol levels, so please schedule a fasting cholesterol test for tomorrow morning.  -AORTIC ANEURYSM, STABLE: Your aortic aneurysm is stable with no growth. You will follow up with your cardiologist in two years.  -PERIPHERAL NEUROPATHY: You have neuropathy symptoms in your hips and legs. Continue using gabapentin  as needed to manage your symptoms.  -HISTORY OF PROSTATE CANCER: You have a history of  prostate cancer and have completed your follow-up with the urologist. You have one more visit next year, after which your PSA levels will be managed by your primary care doctor.  INSTRUCTIONS:  Please follow up with behavioral health for therapy for your anxiety and depression. Continue monitoring your blood pressure at home and report if it exceeds the target of less than 130/80 mmHg. Schedule a fasting cholesterol test for tomorrow morning. You are scheduled for injections in your lower back and hips on September 13th. Follow up with your urologist next year for your final visit regarding prostate cancer.

## 2024-02-19 NOTE — Telephone Encounter (Signed)
 J, He may need to be on mealtime insulin  at that time.  We can send a prescription for Lyumjev  (or Fiasp ) whichever covered, to be taken at 4 to 8 units before each meal, depending on the size of his meals.  He can start at 4 units and increase if the sugars are high after meals.  If these are not covered, we can always go to Humalog or NovoLog .  However, if we switch to these, they need to be injected 15 minutes before each meal.  The dose will be the same.

## 2024-02-19 NOTE — Progress Notes (Signed)
 Subjective:  Patient ID: Jose Bishop, male    DOB: July 24, 1965  Age: 58 y.o. MRN: [id number]  CC: Medical Management of Chronic Issues (Referral to therapy)     Discussed the use of AI scribe software for clinical note transcription with the patient, who gave verbal consent to proceed.  History of Present Illness Jose Bishop is a 58 year old male with a history of type 2 diabetes (A1c 7.7 managed by endocrine), history of prostate cancer hypertension, depression, DDD of lumbar spine, dilatation of ascending aorta. who presents for a referral to therapy for anxiety and depression.  He previously attended therapy at [facility] but stopped after his therapist left. He experiences frustration and anxiety related to retirement and financial concerns, including his daughter's education expenses.  He has diabetes with a last A1c of 8.0 in June and is on glipizide  and metformin . He takes the immediate release form of metformin  due to pharmacy errors but states he should be on the extended release and uses Ozempic  when available. Neuropathy affects his hips and legs, and he uses gabapentin  as needed.  He has hypertension managed with lisinopril  20 mg and chlorthalidone  25 mg however he states he takes lisinopril  daily for 1 week and the next week switches to chlorthalidone  because when he takes both of them he becomes hypotensive.     He has a history of prostate cancer and is under urologic care, with one more visit scheduled next year. Chronic hip pain from bursitis is managed with scheduled injections and past treatments like physical therapy and chiropractic care.  At the moment he has pain in both hips which limits his mobility and he is scheduled to receive injections next week.   He has high cholesterol managed with Zetia  after discontinuing statins due to statin myopathy and Repatha  due to cost as he found himself in the donut hole and has been unable to afford the  Repatha     Past Medical History:  Diagnosis Date   AAA (abdominal aortic aneurysm) (HCC)    Anxiety    Bradycardia    Bradycardia    Difficulty sleeping    Essential hypertension    Family history of stroke    Headache    History of transient ischemic attack (TIA)    15 YRS AGO   Hyperlipidemia    Impotence of organic origin    Liver cyst    Memory loss    Osteoarthritis of acromioclavicular joint    Prostate cancer (HCC)    PVC (premature ventricular contraction)    Type 2 diabetes mellitus with hyperglycemia (HCC)     Past Surgical History:  Procedure Laterality Date   HERNIA REPAIR     X2 ( 1 ING HERNIA / 1 UMBILICAL HERNIA )   LYMPHADENECTOMY Bilateral 01/04/2015   Procedure: BILATERAL LYMPHADENECTOMY;  Surgeon: Gretel Ferrara, MD;  Location: [facility];  Service: Urology;  Laterality: Bilateral;   MASS EXCISION     L UPPER ARM   PENILE PROSTHESIS IMPLANT  03/18/2019   ROBOT ASSISTED LAPAROSCOPIC RADICAL PROSTATECTOMY N/A 01/04/2015   Procedure: ROBOTIC ASSISTED LAPAROSCOPIC RADICAL PROSTATECTOMY LEVEL 2;  Surgeon: Gretel Ferrara, MD;  Location: [facility];  Service: Urology;  Laterality: N/A;   ROTATOR CUFF REPAIR Bilateral    SURGERY SCROTAL / TESTICULAR      Family History  Problem Relation Age of Onset   Anuerysm Mother    Stroke Mother    Hypertension Father    Peripheral vascular  disease Father    Stroke Father    Colon cancer Father        53's   Diabetes Sister    Heart murmur Sister    Heart murmur Son    Stomach cancer Neg Hx    Rectal cancer Neg Hx    Throat cancer Neg Hx    Esophageal cancer Neg Hx    Pancreatic cancer Neg Hx     Social History   Socioeconomic History   Marital status: Married    Spouse name: Not on file   Number of children: 5   Years of education: Not on file   Highest education level: Associate degree: occupational, Scientist, product/process development, or vocational program  Occupational History   Occupation: part time  Tobacco Use   Smoking  status: Never    Passive exposure: Never   Smokeless tobacco: Never  Vaping Use   Vaping status: Never Used  Substance and Sexual Activity   Alcohol use: Not Currently   Drug use: No   Sexual activity: Yes    Partners: Female  Other Topics Concern   Not on file  Social History Narrative   Not on file   Social Drivers of Health   Financial Resource Strain: Low Risk  (12/06/2022)   Overall Financial Resource Strain (CARDIA)    Difficulty of Paying Living Expenses: Not hard at all  Food Insecurity: No Food Insecurity (12/06/2022)   Hunger Vital Sign    Worried About Running Out of Food in the Last Year: Never true    Ran Out of Food in the Last Year: Never true  Transportation Needs: No Transportation Needs (12/06/2022)   PRAPARE - Administrator, Civil Service (Medical): No    Lack of Transportation (Non-Medical): No  Physical Activity: Insufficiently Active (12/06/2022)   Exercise Vital Sign    Days of Exercise per Week: 3 days    Minutes of Exercise per Session: 30 min  Stress: No Stress Concern Present (12/06/2022)   [employer] of Occupational Health - Occupational Stress Questionnaire    Feeling of Stress : Only a little  Social Connections: Socially Integrated (12/06/2022)   Social Connection and Isolation Panel    Frequency of Communication with Friends and Family: Twice a week    Frequency of Social Gatherings with Friends and Family: Twice a week    Attends Religious Services: More than 4 times per year    Active Member of [employer] or Organizations: Yes    Attends Engineer, structural: More than 4 times per year    Marital Status: Married    Allergies  Allergen Reactions   Bee Venom Anaphylaxis   Lipitor [Atorvastatin ] Other (See Comments)    FATIGUED NAUSEA BRAIN FOG   Statins Nausea And Vomiting and Other (See Comments)    Brain fog, fatigue   Dapagliflozin Pro-Metformin  Er Other (See Comments)    Unknown     Outpatient Medications  Prior to Visit  Medication Sig Dispense Refill   Accu-Chek Softclix Lancets lancets Use as instructed 1x a day 100 each 3   APPLE CIDER VINEGAR PO Take by mouth.     BLACK CURRANT SEED OIL PO Take by mouth.     Blood Glucose Monitoring Suppl (ACCU-CHEK GUIDE) w/Device KIT Use as advised 1 kit 0   cetirizine  (ZYRTEC ) 10 MG tablet Take 1 tablet (10 mg total) by mouth daily. 30 tablet 11   CINNAMON PO Take by mouth.     clotrimazole -betamethasone  (LOTRISONE )  cream Apply 1 Application topically daily. 45 g 2   Continuous Glucose Sensor (FREESTYLE LIBRE 3 SENSOR) MISC APPLY EVERY 14 DAYS 6 each 3   diclofenac (VOLTAREN) 75 MG EC tablet Take 1 tablet by mouth 2 (two) times daily with a meal.     ezetimibe  (ZETIA ) 10 MG tablet TAKE 1 TABLET(10 MG) BY MOUTH DAILY 90 tablet 3   fluticasone  (FLONASE ) 50 MCG/ACT nasal spray Place 2 sprays into both nostrils daily. 16 g 6   gabapentin  (NEURONTIN ) 300 MG capsule TAKE 1 CAPSULE(300 MG) BY MOUTH AT BEDTIME 30 capsule 3   Ginger, Zingiber officinalis, (GINGER PO) Take by mouth.     glipiZIDE  (GLUCOTROL  XL) 5 MG 24 hr tablet Take 1 tablet (5 mg total) by mouth daily with breakfast. 90 tablet 3   glucose blood test strip Use as instructed 1x a day 100 each 3   ibuprofen  (ADVIL ) 600 MG tablet Take 1 tablet (600 mg total) by mouth every 8 (eight) hours as needed (pain). 15 tablet 0   Insulin  Pen Needle 32G X 4 MM MISC Use 1x a day 100 each 3   latanoprost  (XALATAN ) 0.005 % ophthalmic solution Place 1 drop into both eyes at bedtime.     metFORMIN  (GLUCOPHAGE -XR) 500 MG 24 hr tablet Take 500 mg by mouth in the morning, at noon, in the evening, and at bedtime.     omeprazole  (PRILOSEC) 40 MG capsule Take 1 capsule (40 mg total) by mouth in the morning. 30 capsule 1   OVER THE COUNTER MEDICATION BEET SUPPLEMENT     chlorthalidone  (HYGROTON ) 25 MG tablet TAKE 1 TABLET(25 MG) BY MOUTH DAILY 90 tablet 1   lisinopril  (ZESTRIL ) 20 MG tablet TAKE 1 TABLET(30 MG) BY MOUTH  DAILY 90 tablet 1   Semaglutide ,0.25 or 0.5MG /DOS, (OZEMPIC , 0.25 OR 0.5 MG/DOSE,) 2 MG/3ML SOPN Inject 0.5 Units into the skin once a week. (Patient not taking: Reported on 02/19/2024)     No facility-administered medications prior to visit.     ROS Review of Systems  Constitutional:  Negative for activity change and appetite change.  HENT:  Negative for sinus pressure and sore throat.   Respiratory:  Negative for chest tightness, shortness of breath and wheezing.   Cardiovascular:  Negative for chest pain and palpitations.  Gastrointestinal:  Negative for abdominal distention, abdominal pain and constipation.  Genitourinary: Negative.   Musculoskeletal:        See HPI  Neurological:  Positive for numbness.  Psychiatric/Behavioral:  Negative for behavioral problems and dysphoric mood.     Objective:  BP 114/71   Pulse (!) 55   Ht 6' (1.829 m)   Wt 256 lb (116.1 kg)   SpO2 99%   BMI 34.72 kg/m      02/19/2024    1:48 PM 01/14/2024    2:03 PM 01/14/2024    1:53 PM  BP/Weight  Systolic BP 114 122 135  Diastolic BP 71 86 82  Wt. (Lbs) 256    BMI 34.72 kg/m2        Physical Exam Constitutional:      Appearance: He is well-developed.  Cardiovascular:     Rate and Rhythm: Normal rate.     Heart sounds: Normal heart sounds. No murmur heard. Pulmonary:     Effort: Pulmonary effort is normal.     Breath sounds: Normal breath sounds. No wheezing or rales.  Chest:     Chest wall: No tenderness.  Abdominal:     General:  Bowel sounds are normal. There is no distension.     Palpations: Abdomen is soft. There is no mass.     Tenderness: There is no abdominal tenderness.  Musculoskeletal:        General: Normal range of motion.     Right lower leg: No edema.     Left lower leg: No edema.  Neurological:     Mental Status: He is alert and oriented to person, place, and time.  Psychiatric:        Mood and Affect: Mood normal.        Latest Ref Rng & Units 11/10/2023     3:37 AM 05/28/2023    4:53 PM 12/27/2022    1:33 PM  CMP  Glucose 70 - 99 mg/dL 790  810  862    862   BUN 6 - 20 mg/dL 20  13  17    Creatinine 0.61 - 1.24 mg/dL 8.87  8.97  8.70   Sodium 135 - 145 mmol/L 135  137  137   Potassium 3.5 - 5.1 mmol/L 3.6  4.6  4.3   Chloride 98 - 111 mmol/L 106  100  99   CO2 22 - 32 mmol/L 23  23  22    Calcium  8.9 - 10.3 mg/dL 8.4  9.9  9.6   Total Protein 6.5 - 8.1 g/dL 6.7  6.9    Total Bilirubin 0.0 - 1.2 mg/dL 0.7  0.3    Alkaline Phos 38 - 126 U/L 48  85    AST 15 - 41 U/L 27  27    ALT 0 - 44 U/L 28  30      Lipid Panel     Component Value Date/Time   CHOL 229 (H) 12/27/2022 1333   CHOL 150 12/21/2021 0906   TRIG 226.0 (H) 12/27/2022 1333   HDL 59.40 12/27/2022 1333   HDL 59 12/21/2021 0906   CHOLHDL 4 12/27/2022 1333   VLDL 45.2 (H) 12/27/2022 1333   LDLCALC 71 12/21/2021 0906   LDLDIRECT 143.0 12/27/2022 1333    CBC    Component Value Date/Time   WBC 5.9 11/10/2023 0337   RBC 4.66 11/10/2023 0337   HGB 14.3 11/10/2023 0337   HGB 14.2 01/07/2021 1602   HCT 43.7 11/10/2023 0337   HCT 42.9 01/07/2021 1602   PLT 287 11/10/2023 0337   PLT 352 01/07/2021 1602   MCV 93.8 11/10/2023 0337   MCV 91 01/07/2021 1602   MCH 30.7 11/10/2023 0337   MCHC 32.7 11/10/2023 0337   RDW 12.9 11/10/2023 0337   RDW 12.8 01/07/2021 1602   LYMPHSABS 3.2 10/19/2022 1609   MONOABS 0.5 10/19/2022 1609   EOSABS 0.5 10/19/2022 1609   BASOSABS 0.0 10/19/2022 1609    Lab Results  Component Value Date   HGBA1C 8.0 (A) 12/14/2023       Assessment & Plan Anxiety Increased anxiety and frustration related to financial concerns and retirement planning. Current therapy inadequate. - Refer to behavioral health for therapy for anxiety and depression.  Chronic back and hip pain with bilateral hip bursitis Persistent pain exacerbated by previous injuries. Scheduled for injections. - Proceed with scheduled injections in the lower back and hips on  September 13th. - Consider physical therapy after injections; he is not open to doing so at the moment - Consider water  therapy or yoga.  Type 2 diabetes mellitus A1c at 8.0% in June, suboptimal control.  - Monitor blood glucose levels post-cortisone injections. - Continue  management per endocrine. -Counseled on Diabetic diet, the healthy plate, 849 minutes of moderate intensity exercise/week Blood sugar logs with fasting goals of 80-120 mg/dl, random of less than 819 and in the event of sugars less than 60 mg/dl or greater than 599 mg/dl encouraged to notify the clinic. Advised on the need for annual eye exams, annual foot exams, Pneumonia vaccine.   Hypertension associated with type 2 diabetes mellitus Blood pressure well-controlled on lisinopril . Previously alternated with chlorthalidone  due to hypotension. - Discontinue chlorthalidone . - Continue lisinopril  20 mg daily. - Monitor blood pressure at home, target <130/80 mmHg. - Report if blood pressure exceeds target.  Hyperlipidemia with statin-induced myopathy Intolerant to statins, previously on Repatha , now on Zetia . Cholesterol levels need re-evaluation. -Low-cholesterol diet - Schedule fasting cholesterol test for tomorrow morning.  Aortic aneurysm, stable 3.9 cm aortic aneurysm stable from CTA in 06/2022,  Cardiologist follow-up in two years.  Peripheral neuropathy Neuropathy symptoms in hips and legs. Gabapentin  used as needed. - Continue gabapentin  as needed.  History of prostate cancer Follow-up with urology next year      Meds ordered this encounter  Medications   lisinopril  (ZESTRIL ) 20 MG tablet    Sig: TAKE 1 TABLET(30 MG) BY MOUTH DAILY    Dispense:  90 tablet    Refill:  1    Discontinue Chlorthalidone     Follow-up: Return in about 6 months (around 08/21/2024) for Chronic medical conditions.       Corrina Sabin, MD, FAAFP. [facility] [place],  [place] [phone]   02/19/2024, 3:08 PM

## 2024-02-20 ENCOUNTER — Ambulatory Visit: Payer: Self-pay | Admitting: Family Medicine

## 2024-02-20 LAB — LP+NON-HDL CHOLESTEROL
Cholesterol, Total: 229 mg/dL — ABNORMAL HIGH (ref 100–199)
HDL: 60 mg/dL (ref >39)
LDL Chol Calc (NIH): 148 mg/dL — ABNORMAL HIGH (ref 0–99)
Total Non-HDL-Chol (LDL+VLDL): 169 mg/dL — ABNORMAL HIGH (ref 0–129)
Triglycerides: 117 mg/dL (ref 0–149)
VLDL Cholesterol Cal: 21 mg/dL (ref 5–40)

## 2024-02-20 MED ORDER — LYUMJEV KWIKPEN 100 UNIT/ML ~~LOC~~ SOPN: 4.0000 [IU] | PEN_INJECTOR | Freq: Three times a day (TID) | SUBCUTANEOUS | 2 refills | Status: AC

## 2024-02-20 NOTE — Addendum Note (Signed)
 Addended by: CLEOTILDE ROLIN RAMAN on: 02/20/2024 03:20 PM   Modules accepted: Orders

## 2024-02-29 ENCOUNTER — Other Ambulatory Visit: Payer: Self-pay | Admitting: Family Medicine

## 2024-02-29 DIAGNOSIS — E1159 Type 2 diabetes mellitus with other circulatory complications: Secondary | ICD-10-CM

## 2024-03-03 NOTE — Telephone Encounter (Signed)
 Ok to start Mounjaro  2.5 mg weekly but this will not be enough when he gets the steroid injection.  He will still need rapid acting insulin , most likely, as mentioned before.

## 2024-03-04 MED ORDER — TIRZEPATIDE 2.5 MG/0.5ML ~~LOC~~ SOAJ: 2.5000 mg | SUBCUTANEOUS | 3 refills | Status: DC

## 2024-03-04 NOTE — Addendum Note (Signed)
 Addended by: CLEOTILDE ROLIN RAMAN on: 03/04/2024 08:06 AM   Modules accepted: Orders

## 2024-03-10 ENCOUNTER — Telehealth: Payer: Self-pay

## 2024-03-10 ENCOUNTER — Other Ambulatory Visit (HOSPITAL_COMMUNITY): Payer: Self-pay

## 2024-03-10 NOTE — Telephone Encounter (Signed)
 Pt needs a Prior Quarry manager for [employer]

## 2024-03-10 NOTE — Telephone Encounter (Signed)
 Pharmacy Patient Advocate Encounter   Received notification from Pt Calls Messages that prior authorization for Mounjaro  2.5mg /0.21ml is required/requested.   Insurance verification completed.   The patient is insured through [facility] .   Per test claim: Refill too soon. PA is not needed at this time. Medication was filled 03/07/24. Next eligible fill date is 03/28/2024.

## 2024-03-11 NOTE — Telephone Encounter (Signed)
 Called pharmacy and they are not currently open

## 2024-03-18 LAB — HM DIABETES EYE EXAM

## 2024-03-26 ENCOUNTER — Encounter: Payer: Self-pay | Admitting: Cardiovascular Disease

## 2024-03-26 ENCOUNTER — Other Ambulatory Visit (HOSPITAL_COMMUNITY): Payer: Self-pay

## 2024-03-26 ENCOUNTER — Telehealth: Payer: Self-pay | Admitting: Pharmacy Technician

## 2024-03-26 MED ORDER — REPATHA SURECLICK 140 MG/ML ~~LOC~~ SOAJ: 140.0000 mg | SUBCUTANEOUS | 11 refills | Status: AC

## 2024-03-26 NOTE — Addendum Note (Signed)
 Addended by: LORRENE FEDERICO CROME on: 03/26/2024 01:56 PM   Modules accepted: Orders

## 2024-03-26 NOTE — Telephone Encounter (Signed)
    PA GOOD UNTIL 06/25/24  Patient Advocate Encounter   The patient was approved for a [employer] grant that will help cover the cost of repatha  Total amount awarded, 2500.  Effective: 02/25/24 - 02/23/25   APW:[id number] ERW:[id number] Hmnle:[id number] PI:[id number] [employer] ID: [id number]   Pharmacy provided with approval and processing information. Patient informed via mychart

## 2024-04-02 ENCOUNTER — Ambulatory Visit: Attending: Cardiovascular Disease | Admitting: Cardiovascular Disease

## 2024-04-02 ENCOUNTER — Encounter: Payer: Self-pay | Admitting: Cardiovascular Disease

## 2024-04-02 VITALS — BP 118/64 | HR 82 | Ht 74.0 in | Wt 258.8 lb

## 2024-04-02 DIAGNOSIS — I10 Essential (primary) hypertension: Secondary | ICD-10-CM | POA: Diagnosis not present

## 2024-04-02 DIAGNOSIS — R0789 Other chest pain: Secondary | ICD-10-CM

## 2024-04-02 DIAGNOSIS — E782 Mixed hyperlipidemia: Secondary | ICD-10-CM | POA: Diagnosis not present

## 2024-04-02 DIAGNOSIS — I7121 Aneurysm of the ascending aorta, without rupture: Secondary | ICD-10-CM

## 2024-04-02 NOTE — Patient Instructions (Signed)
 Medication Instructions:  Your physician recommends that you continue on your current medications as directed. Please refer to the Current Medication list given to you today.  *If you need a refill on your cardiac medications before your next appointment, please call your pharmacy*  Lab Work: Your physician recommends that you return for lab work in: 3 months for FASTING Lipid/liver panel  If you have labs (blood work) drawn today and your tests are completely normal, you will receive your results only by: MyChart Message (if you have MyChart) OR A paper copy in the mail If you have any lab test that is abnormal or we need to change your treatment, we will call you to review the results.  Testing/Procedures: Your physician has requested that you have an echocardiogram. Echocardiography is a painless test that uses sound waves to create images of your heart. It provides your doctor with information about the size and shape of your heart and how well your heart's chambers and valves are working. This procedure takes approximately one hour. There are no restrictions for this procedure. Please do NOT wear cologne, perfume, aftershave, or lotions (deodorant is allowed). Please arrive 15 minutes prior to your appointment time.  Please note: We ask at that you not bring children with you during ultrasound (echo/ vascular) testing. Due to room size and safety concerns, children are not allowed in the ultrasound rooms during exams. Our front office staff cannot provide observation of children in our lobby area while testing is being conducted. An adult accompanying a patient to their appointment will only be allowed in the ultrasound room at the discretion of the ultrasound technician under special circumstances. We apologize for any inconvenience. **To do in Janurary**   Follow-Up: At [facility], you and your health needs are our priority.  As part of our continuing mission to provide you  with exceptional heart care, our providers are all part of one team.  This team includes your primary Cardiologist (physician) and Advanced Practice Providers or APPs (Physician Assistants and Nurse Practitioners) who all work together to provide you with the care you need, when you need it.  Your next appointment:   12 month(s)  Provider:   Dorn Lesches, MD

## 2024-04-02 NOTE — Assessment & Plan Note (Addendum)
 History of atypical chest pain in the past with a coronary calcium  score of 1 but coronary CTA 05/11/2017.  He did have a stress echo at [facility] 12/30/2018 which was normal.  He does complain of some atypical localized right-sided chest pain.

## 2024-04-02 NOTE — Progress Notes (Signed)
 04/02/2024 Jose Bishop   [birth year]  [id number]  Primary Physician Delbert Clam, MD Primary Cardiologist: Dorn JINNY Lesches MD [facility] [place], [place]  HPI:  Jose Bishop is a 58 y.o.   moderately overweight married African-American male father of 5, grandfather and one grandchild who was a truck Hospital doctor . 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  He is currently on disability.  He self-referred for evaluation of new onset chest pain .I last saw him in the office 10/22/2020.  He has had 3 episodes that lasted days a time with radiation to his neck and left upper extremity. He really has no risk factors other than recently diagnosed type 2 diabetes.   I performed a Myoview  stress test on him 02/02/2017 that showed apical ischemia subsequent coronary CTA 05/11/2017 showed essentially a coronary calcium  score of 1 with no evidence of CAD.  He did however have a 42-minute millimeter ascending thoracic aorta.  He was admitted to [facility] apparently last year for bradycardia and PVCs.  He apparently wore a Zio patch at that time although the results are unavailable to me.  He had stress echo performed at [facility] 12/30/2018 which was entirely normal.   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  He smokes an occasional cigar.  He complains of some atypical constant pain in his right axilla radiating to his right chest and back which is painful to palpation.  His last chest CTA performed 2 years ago revealed an ascending aorta that measured 38 mm.  He is getting ready to go to [place] next week.   Current Meds  Medication Sig   Accu-Chek Softclix Lancets lancets Use as instructed 1x a day   APPLE CIDER VINEGAR PO Take by mouth.   BLACK CURRANT SEED OIL PO Take by mouth.   Blood Glucose Monitoring Suppl (ACCU-CHEK GUIDE) w/Device KIT Use as advised   cetirizine  (ZYRTEC ) 10 MG  tablet Take 1 tablet (10 mg total) by mouth daily.   CINNAMON PO Take by mouth.   Continuous Glucose Sensor (FREESTYLE LIBRE 3 SENSOR) MISC APPLY EVERY 14 DAYS   Evolocumab  (REPATHA  SURECLICK) 140 MG/ML SOAJ Inject 140 mg into the skin every 14 (fourteen) days.   ezetimibe  (ZETIA ) 10 MG tablet TAKE 1 TABLET(10 MG) BY MOUTH DAILY   fluticasone  (FLONASE ) 50 MCG/ACT nasal spray Place 2 sprays into both nostrils daily.   gabapentin  (NEURONTIN ) 300 MG capsule TAKE 1 CAPSULE(300 MG) BY MOUTH AT BEDTIME   Ginger, Zingiber officinalis, (GINGER PO) Take by mouth.   glipiZIDE  (GLUCOTROL  XL) 5 MG 24 hr tablet Take 1 tablet (5 mg total) by mouth daily with breakfast.   glucose blood test strip Use as instructed 1x a day   ibuprofen  (ADVIL ) 600 MG tablet Take 1 tablet (600 mg total) by mouth every 8 (eight) hours as needed (pain).   Insulin  Lispro-aabc (LYUMJEV  KWIKPEN) 100 UNIT/ML KwikPen Inject 4-8 Units into the skin 3 (three) times daily before meals.   Insulin  Pen Needle 32G X 4 MM MISC Use 1x a day   latanoprost  (XALATAN ) 0.005 % ophthalmic solution Place 1 drop into both eyes at bedtime.   lisinopril  (ZESTRIL ) 20 MG tablet TAKE 1 TABLET(30 MG) BY MOUTH DAILY   metFORMIN  (GLUCOPHAGE -XR) 500 MG 24 hr tablet Take 500 mg by mouth in the morning, at noon, in the evening, and  at bedtime.   omeprazole  (PRILOSEC) 40 MG capsule Take 1 capsule (40 mg total) by mouth in the morning.   OVER THE COUNTER MEDICATION BEET SUPPLEMENT   tirzepatide  (MOUNJARO ) 2.5 MG/0.5ML Pen Inject 2.5 mg into the skin once a week.     Allergies  Allergen Reactions   Bee Venom Anaphylaxis   Lipitor [Atorvastatin ] Other (See Comments)    FATIGUED NAUSEA BRAIN FOG   Statins Nausea And Vomiting and Other (See Comments)    Brain fog, fatigue   Dapagliflozin Pro-Metformin  Er Other (See Comments)    Unknown     Social History   Socioeconomic History   Marital status: Married    Spouse name: Not on file   Number of children:  5   Years of education: Not on file   Highest education level: Associate degree: occupational, Scientist, product/process development, or vocational program  Occupational History   Occupation: part time  Tobacco Use   Smoking status: Never    Passive exposure: Never   Smokeless tobacco: Never  Vaping Use   Vaping status: Never Used  Substance and Sexual Activity   Alcohol use: Not Currently   Drug use: No   Sexual activity: Yes    Partners: Female  Other Topics Concern   Not on file  Social History Narrative   Not on file   Social Drivers of Health   Financial Resource Strain: Low Risk  (12/06/2022)   Overall Financial Resource Strain (CARDIA)    Difficulty of Paying Living Expenses: Not hard at all  Food Insecurity: No Food Insecurity (12/06/2022)   Hunger Vital Sign    Worried About Running Out of Food in the Last Year: Never true    Ran Out of Food in the Last Year: Never true  Transportation Needs: No Transportation Needs (12/06/2022)   PRAPARE - Administrator, Civil Service (Medical): No    Lack of Transportation (Non-Medical): No  Physical Activity: Insufficiently Active (12/06/2022)   Exercise Vital Sign    Days of Exercise per Week: 3 days    Minutes of Exercise per Session: 30 min  Stress: No Stress Concern Present (12/06/2022)   [employer] of Occupational Health - Occupational Stress Questionnaire    Feeling of Stress : Only a little  Social Connections: Socially Integrated (12/06/2022)   Social Connection and Isolation Panel    Frequency of Communication with Friends and Family: Twice a week    Frequency of Social Gatherings with Friends and Family: Twice a week    Attends Religious Services: More than 4 times per year    Active Member of [employer] or Organizations: Yes    Attends Engineer, structural: More than 4 times per year    Marital Status: Married  Catering manager Violence: Not At Risk (12/06/2022)   Humiliation, Afraid, Rape, and Kick questionnaire    Fear  of Current or Ex-Partner: No    Emotionally Abused: No    Physically Abused: No    Sexually Abused: No     Review of Systems: General: negative for chills, fever, night sweats or weight changes.  Cardiovascular: negative for chest pain, dyspnea on exertion, edema, orthopnea, palpitations, paroxysmal nocturnal dyspnea or shortness of breath Dermatological: negative for rash Respiratory: negative for cough or wheezing Urologic: negative for hematuria Abdominal: negative for nausea, vomiting, diarrhea, bright red blood per rectum, melena, or hematemesis Neurologic: negative for visual changes, syncope, or dizziness All other systems reviewed and are otherwise negative except as noted above.  Blood pressure 118/64, pulse 82, height 6' 2 (1.88 m), weight 258 lb 12.8 oz (117.4 kg), SpO2 96%.  General appearance: alert and no distress Neck: no adenopathy, no carotid bruit, no JVD, supple, symmetrical, trachea midline, and thyroid not enlarged, symmetric, no tenderness/mass/nodules Lungs: clear to auscultation bilaterally Heart: regular rate and rhythm, S1, S2 normal, no murmur, click, rub or gallop Extremities: extremities normal, atraumatic, no cyanosis or edema Pulses: 2+ and symmetric Skin: Skin color, texture, turgor normal. No rashes or lesions Neurologic: Grossly normal  EKG not performed today      ASSESSMENT AND PLAN:   Atypical chest pain History of atypical chest pain in the past with a coronary calcium  score of 1 but coronary CTA 05/11/2017.  He did have a stress echo at [facility] 12/30/2018 which was normal.  He does complain of some atypical localized right-sided chest pain.  Hyperlipidemia History of hyperlipidemia on Repatha  which she has been out of and Zetia .  His last lipid profile performed 02/19/2024 revealed total cholesterol 229, LDL 148 HDL 60 off of Repatha .  Will recheck a lipid profile in 3 months since he just got a new prescription.  Thoracic aortic  aneurysm History of small ascending thoracic aortic aneurysm measuring 42 mm.  Will check a 2D echo to follow this up.  CTA performed 07/04/2022 revealed this is a ascending thoracic aorta measuring 39 mm.     Dorn DOROTHA Lesches MD FACP,FACC,FAHA, [facility] 04/02/2024 11:03 AM

## 2024-04-02 NOTE — Assessment & Plan Note (Addendum)
 History of small ascending thoracic aortic aneurysm measuring 42 mm.  Will check a 2D echo to follow this up.  CTA performed 07/04/2022 revealed this is a ascending thoracic aorta measuring 39 mm.

## 2024-04-02 NOTE — Assessment & Plan Note (Signed)
 History of hyperlipidemia on Repatha  which she has been out of and Zetia .  His last lipid profile performed 02/19/2024 revealed total cholesterol 229, LDL 148 HDL 60 off of Repatha .  Will recheck a lipid profile in 3 months since he just got a new prescription.

## 2024-04-10 ENCOUNTER — Encounter: Payer: Self-pay | Admitting: Internal Medicine

## 2024-04-10 ENCOUNTER — Ambulatory Visit: Admitting: Internal Medicine

## 2024-04-10 VITALS — BP 120/70 | HR 58 | Ht 74.0 in | Wt 257.2 lb

## 2024-04-10 DIAGNOSIS — E782 Mixed hyperlipidemia: Secondary | ICD-10-CM | POA: Diagnosis not present

## 2024-04-10 DIAGNOSIS — E1169 Type 2 diabetes mellitus with other specified complication: Secondary | ICD-10-CM | POA: Diagnosis not present

## 2024-04-10 DIAGNOSIS — E1165 Type 2 diabetes mellitus with hyperglycemia: Secondary | ICD-10-CM | POA: Diagnosis not present

## 2024-04-10 DIAGNOSIS — Z794 Long term (current) use of insulin: Secondary | ICD-10-CM

## 2024-04-10 DIAGNOSIS — E1159 Type 2 diabetes mellitus with other circulatory complications: Secondary | ICD-10-CM

## 2024-04-10 DIAGNOSIS — E785 Hyperlipidemia, unspecified: Secondary | ICD-10-CM

## 2024-04-10 DIAGNOSIS — Z7984 Long term (current) use of oral hypoglycemic drugs: Secondary | ICD-10-CM

## 2024-04-10 LAB — POCT GLYCOSYLATED HEMOGLOBIN (HGB A1C): Hemoglobin A1C: 7.8 % — AB (ref 4.0–5.6)

## 2024-04-10 MED ORDER — TIRZEPATIDE 5 MG/0.5ML ~~LOC~~ SOAJ: 5.0000 mg | SUBCUTANEOUS | 3 refills | Status: AC

## 2024-04-10 NOTE — Progress Notes (Signed)
 Patient ID: Jose Bishop, male   DOB: 1966-04-27, 58 y.o.   MRN: [id number]  HPI: Jose Bishop is a 58 y.o.-year-old male, returning for follow-up for DM2, dx in 2020, insulin -dependent, uncontrolled, with complications (aortic atherosclerosis, history of TIA, AAA, CKD). Pt. previously saw Dr. Kassie, but last visit with me was 4 mo ago.  Interim history: He denies increased urination, nausea, chest pain.  He is on a low protein diet, reducing the intake of starches and processed foods. Since last visit, he had a steroid injection in his back in 02/2024.  I suggested mealtime insulin  at that time but she wanted to start Mounjaro .  We sent a PA for this. He was able to start.  Reviewed HbA1c: Lab Results  Component Value Date   HGBA1C 8.0 (A) 12/14/2023   HGBA1C 9.3 (A) 09/13/2023   HGBA1C 7.7 (A) 05/04/2023   HGBA1C 7.6 12/27/2022   HGBA1C 7.8 (A) 09/21/2022   HGBA1C 10.4 (A) 06/01/2022   HGBA1C 8.9 (A) 02/22/2022   HGBA1C 9.6 (A) 07/19/2021   HGBA1C 9.2 (A) 04/18/2021   HGBA1C 9.5 (H) 01/28/2021   Previously on: - Metformin  ER 500 mg 4x a day >> nausea/dry heaving - Glimepiride  2 mg in a.m. and before dinner - Ozempic  1 mg weekly - prev. off 2/2 doughnut hole (2 mg weekly caused nausea, could not eat meals, only ate snacks) He was on Jardiance  for 1 mo >> stopped 2/2 coverage.  We changed to: - Jardiance  25 mg before b'fast - restarted 06/2022 -  >> stopped - Ozempic  0.5 mg weekly - restarted 06/2022 - >> stopped  He ran out Jardiance  and Ozempic  1 mo prior to our appointment in 12/2022.  Now on: - Metformin  ER 500 - 500 - 1000 mg at bedtime >> 1.5 tab 2x a day - Glipizide  ER 5 mg before b'fast - Mounjaro  2.5 mg weekly x 2 doses - Lyumjev  8 units before b'fast and at bedtime He was previously on Lantus  8 >> 10-12 units at bedtime - occasionally >> off >> restarted it 06/2023: 15 >> 20 units daily >> ran out 09/2023.  Pt checks his sugars 1-2x a day: - am: 135-145  before the steroid inj >> 150-165 - 2h after b'fast: n/c - lunch: n/c - 2h after lunch: n/c - dinner: n/c - 2h after dinner: 165-200 - bedtime: n/c  Previously:    Lowest sugar was  90 >> 113 >> 60s >> 90; ? hypoglycemia awareness.  Highest sugar was 300 >> 290 >> 200s >> 220s. He was on insulin  briefly then.  Glucometer: One Touch  Pt's meals are: - Breakfast (2:30-3 am): apple + banana + sunflower seeds, nuts, celery and carrots >> carrots, radishes, celery, nuts - Lunch: wraps, keto bread sandwich  >> boiled egg - Dinner: meat, starch, veggies; chips >> fish, steamed veggies - Snacks: trailmix  - + CKD, last BUN/creatinine:  Lab Results  Component Value Date   BUN 20 11/10/2023   BUN 13 05/28/2023   CREATININE 1.12 11/10/2023   CREATININE 1.02 05/28/2023   Lab Results  Component Value Date   MICRALBCREAT 1 12/14/2023   MICRALBCREAT <4 06/12/2022   MICRALBCREAT 3 01/06/2020  He is on Lisinopril  30 mg daily.  -+ HL; last set of lipids: Lab Results  Component Value Date   CHOL 229 (H) 02/19/2024   HDL 60 02/19/2024   LDLCALC 148 (H) 02/19/2024   LDLDIRECT 143.0 12/27/2022   TRIG 117 02/19/2024   CHOLHDL  4 12/27/2022  He was on Zetia  - off for last 2 weeks, and Repatha  - off in the last year 2/2 doughnut hole.  - last eye exam was on 03/18/2024. No DR. DR. Octavia. + cataract.  - no numbness and tingling in his feet.  Previously saw Dr. Janit, but last foot exam was here in clinic on 12/14/2023.  He also has a history of back pain-disabled due to this, prostate cancer, HTN. He had reconstructive surgery on both shoulders.  He has dysphagia, has a history of esophageal strictures.  He sees GI.  ROS: + see HPI  Past Medical History:  Diagnosis Date   AAA (abdominal aortic aneurysm)    Anxiety    Bradycardia    Bradycardia    Difficulty sleeping    Essential hypertension    Family history of stroke    Headache    History of transient ischemic attack (TIA)     15 YRS AGO   Hyperlipidemia    Impotence of organic origin    Liver cyst    Memory loss    Osteoarthritis of acromioclavicular joint    Prostate cancer (HCC)    PVC (premature ventricular contraction)    Type 2 diabetes mellitus with hyperglycemia (HCC)    Past Surgical History:  Procedure Laterality Date   HERNIA REPAIR     X2 ( 1 ING HERNIA / 1 UMBILICAL HERNIA )   LYMPHADENECTOMY Bilateral 01/04/2015   Procedure: BILATERAL LYMPHADENECTOMY;  Surgeon: Gretel Ferrara, MD;  Location: [facility];  Service: Urology;  Laterality: Bilateral;   MASS EXCISION     L UPPER ARM   PENILE PROSTHESIS IMPLANT  03/18/2019   ROBOT ASSISTED LAPAROSCOPIC RADICAL PROSTATECTOMY N/A 01/04/2015   Procedure: ROBOTIC ASSISTED LAPAROSCOPIC RADICAL PROSTATECTOMY LEVEL 2;  Surgeon: Gretel Ferrara, MD;  Location: [facility];  Service: Urology;  Laterality: N/A;   ROTATOR CUFF REPAIR Bilateral    SURGERY SCROTAL / TESTICULAR     Social History   Socioeconomic History   Marital status: Married    Spouse name: Not on file   Number of children: 5   Years of education: Not on file   Highest education level: Associate degree: occupational, Scientist, product/process development, or vocational program  Occupational History   Occupation: part time  Tobacco Use   Smoking status: Never    Passive exposure: Never   Smokeless tobacco: Never  Vaping Use   Vaping status: Never Used  Substance and Sexual Activity   Alcohol use: Not Currently   Drug use: No   Sexual activity: Yes    Partners: Female  Other Topics Concern   Not on file  Social History Narrative   Not on file   Social Drivers of Health   Financial Resource Strain: Low Risk  (12/06/2022)   Overall Financial Resource Strain (CARDIA)    Difficulty of Paying Living Expenses: Not hard at all  Food Insecurity: No Food Insecurity (12/06/2022)   Hunger Vital Sign    Worried About Running Out of Food in the Last Year: Never true    Ran Out of Food in the Last Year: Never true   Transportation Needs: No Transportation Needs (12/06/2022)   PRAPARE - Administrator, Civil Service (Medical): No    Lack of Transportation (Non-Medical): No  Physical Activity: Insufficiently Active (12/06/2022)   Exercise Vital Sign    Days of Exercise per Week: 3 days    Minutes of Exercise per Session: 30 min  Stress: No Stress  Concern Present (12/06/2022)   [employer] of Occupational Health - Occupational Stress Questionnaire    Feeling of Stress : Only a little  Social Connections: Socially Integrated (12/06/2022)   Social Connection and Isolation Panel    Frequency of Communication with Friends and Family: Twice a week    Frequency of Social Gatherings with Friends and Family: Twice a week    Attends Religious Services: More than 4 times per year    Active Member of [employer] or Organizations: Yes    Attends Engineer, structural: More than 4 times per year    Marital Status: Married  Catering manager Violence: Not At Risk (12/06/2022)   Humiliation, Afraid, Rape, and Kick questionnaire    Fear of Current or Ex-Partner: No    Emotionally Abused: No    Physically Abused: No    Sexually Abused: No   Current Outpatient Medications on File Prior to Visit  Medication Sig Dispense Refill   Accu-Chek Softclix Lancets lancets Use as instructed 1x a day 100 each 3   APPLE CIDER VINEGAR PO Take by mouth.     BLACK CURRANT SEED OIL PO Take by mouth.     Blood Glucose Monitoring Suppl (ACCU-CHEK GUIDE) w/Device KIT Use as advised 1 kit 0   cetirizine  (ZYRTEC ) 10 MG tablet Take 1 tablet (10 mg total) by mouth daily. 30 tablet 11   CINNAMON PO Take by mouth.     Continuous Glucose Sensor (FREESTYLE LIBRE 3 SENSOR) MISC APPLY EVERY 14 DAYS 6 each 3   Evolocumab  (REPATHA  SURECLICK) 140 MG/ML SOAJ Inject 140 mg into the skin every 14 (fourteen) days. 2 mL 11   ezetimibe  (ZETIA ) 10 MG tablet TAKE 1 TABLET(10 MG) BY MOUTH DAILY 90 tablet 3   fluticasone  (FLONASE ) 50  MCG/ACT nasal spray Place 2 sprays into both nostrils daily. 16 g 6   gabapentin  (NEURONTIN ) 300 MG capsule TAKE 1 CAPSULE(300 MG) BY MOUTH AT BEDTIME 30 capsule 3   Ginger, Zingiber officinalis, (GINGER PO) Take by mouth.     glipiZIDE  (GLUCOTROL  XL) 5 MG 24 hr tablet Take 1 tablet (5 mg total) by mouth daily with breakfast. 90 tablet 3   glucose blood test strip Use as instructed 1x a day 100 each 3   ibuprofen  (ADVIL ) 600 MG tablet Take 1 tablet (600 mg total) by mouth every 8 (eight) hours as needed (pain). 15 tablet 0   Insulin  Lispro-aabc (LYUMJEV  KWIKPEN) 100 UNIT/ML KwikPen Inject 4-8 Units into the skin 3 (three) times daily before meals. 15 mL 2   Insulin  Pen Needle 32G X 4 MM MISC Use 1x a day 100 each 3   latanoprost  (XALATAN ) 0.005 % ophthalmic solution Place 1 drop into both eyes at bedtime.     lisinopril  (ZESTRIL ) 20 MG tablet TAKE 1 TABLET(30 MG) BY MOUTH DAILY 90 tablet 1   metFORMIN  (GLUCOPHAGE -XR) 500 MG 24 hr tablet Take 500 mg by mouth in the morning, at noon, in the evening, and at bedtime.     omeprazole  (PRILOSEC) 40 MG capsule Take 1 capsule (40 mg total) by mouth in the morning. 30 capsule 1   OVER THE COUNTER MEDICATION BEET SUPPLEMENT     tirzepatide  (MOUNJARO ) 2.5 MG/0.5ML Pen Inject 2.5 mg into the skin once a week. 2 mL 3   No current facility-administered medications on file prior to visit.   Allergies  Allergen Reactions   Bee Venom Anaphylaxis   Lipitor [Atorvastatin ] Other (See Comments)    FATIGUED  NAUSEA BRAIN FOG   Statins Nausea And Vomiting and Other (See Comments)    Brain fog, fatigue   Dapagliflozin Pro-Metformin  Er Other (See Comments)    Unknown    Family History  Problem Relation Age of Onset   Anuerysm Mother    Stroke Mother    Hypertension Father    Peripheral vascular disease Father    Stroke Father    Colon cancer Father        70's   Diabetes Sister    Heart murmur Sister    Heart murmur Son    Stomach cancer Neg Hx     Rectal cancer Neg Hx    Throat cancer Neg Hx    Esophageal cancer Neg Hx    Pancreatic cancer Neg Hx    PE: BP 120/70   Pulse (!) 58   Ht 6' 2 (1.88 m)   Wt 257 lb 3.2 oz (116.7 kg)   SpO2 98%   BMI 33.02 kg/m  Wt Readings from Last 3 Encounters:  04/10/24 257 lb 3.2 oz (116.7 kg)  04/02/24 258 lb 12.8 oz (117.4 kg)  02/19/24 256 lb (116.1 kg)   Constitutional: overweight, in NAD Eyes:  EOMI, no exophthalmos ENT: no neck masses, no cervical lymphadenopathy Cardiovascular: RRR, No MRG Respiratory: CTA B Musculoskeletal: no deformities Skin:no rashes Neurological: no tremor with outstretched hands  ASSESSMENT: 1. DM2, non-insulin -dependent, uncontrolled, with complications - Aortic ATC - per CTA 06/30/2022 - AAA - TIA - CKD  Component     Latest Ref Rng 12/27/2022  C-Peptide     0.80 - 3.85 ng/mL 2.84   IA-2 Antibody     <5.4 U/mL <5.4   Glutamic Acid Decarb Ab     <5 IU/mL <5   Znt8 antibody was not drawn, unfortunately.  2. HL  PLAN:  1. Patient with longstanding, uncontrolled, type 2 diabetes, on oral antidiabetic regimen with metformin  and previously long-acting insulin , to which we added a sulfonylurea at last visit.  At that time, HbA1c was 8.0%, improved from 9.3%.  Sugars appears to be fluctuating mainly within the upper target range with higher blood sugars after meals, particularly after larger meals.  I did not recommend to add back Lantus  quite at that time.  Since then, he was planning for a steroid injection and he contacted the clinic.  I recommended mealtime insulin , but he was reticent to start it, instead wanting to try Mounjaro .  Of note, he could not afford Ozempic  and Jardiance  in the past. -At today's visit, he is off the sensor, as they were coming off.  I advised him to restart.  Sugars at home appear to still be elevated but he mentions that they are improving after his steroid injection.  He is taking Lyumjev  before breakfast and at bedtime.  I  advised him to move all the doses before meals.  I also advised him to vary the dose between 6 to 8 units. -He tells me that he was able to start Mounjaro  but only recently, 2 weeks ago.  He is tolerating it well.  I advised him to increase the dose.  We discussed that now that he is off the steroid, and since we are increasing the Mounjaro  dose, we need to try to reduce the dose of his mealtime insulin  and possibly even coming off.  Will continue metformin  and glipizide  for now. -We have to keep in mind that he did have side effects to the 2 mg of Ozempic  in  the past. - I suggested to:  Patient Instructions  Please continue: - Metformin  ER 500 mg 1.5 tablet 2x a day - Glipizide  ER 5 mg before b'fast - Lyumjev  6-8 units before meals  Try to increase: - Mounjaro  5 mg weekly  If the sugars improve, decrease the FiAsp  dose and see if you can stop.  Try to restart the sensor.  Please return in 3-4 months.  - we checked his HbA1c: 7.8% (slightly lower than before) - advised to check sugars at different times of the day - 4x a day, rotating check times - advised for yearly eye exams >> he is UTD - return to clinic in 3-4 months  2. HL -Reviewed his latest lipid panel from 01/2024: LDL above target, otherwise fractions at goal: Lab Results  Component Value Date   CHOL 229 (H) 02/19/2024   HDL 60 02/19/2024   LDLCALC 148 (H) 02/19/2024   LDLDIRECT 143.0 12/27/2022   TRIG 117 02/19/2024   CHOLHDL 4 12/27/2022  - On Repatha  and Zetia  10 mg daily without side effects  Lela Fendt, MD PhD [facility]

## 2024-04-10 NOTE — Patient Instructions (Addendum)
 Please continue: - Metformin  ER 500 mg 1.5 tablet 2x a day - Glipizide  ER 5 mg before b'fast - Lyumjev  6-8 units before meals  Try to increase: - Mounjaro  5 mg weekly  If the sugars improve, decrease the FiAsp  dose and see if you can stop.  Try to restart the sensor.  Please return in 3-4 months.

## 2024-04-28 ENCOUNTER — Encounter (HOSPITAL_COMMUNITY): Payer: Self-pay | Admitting: Family

## 2024-04-28 ENCOUNTER — Encounter (HOSPITAL_COMMUNITY): Payer: Self-pay

## 2024-04-28 ENCOUNTER — Ambulatory Visit (HOSPITAL_COMMUNITY): Admitting: Family

## 2024-04-28 ENCOUNTER — Other Ambulatory Visit: Payer: Self-pay

## 2024-05-06 ENCOUNTER — Ambulatory Visit (HOSPITAL_COMMUNITY): Admitting: Licensed Clinical Social Worker

## 2024-05-06 ENCOUNTER — Encounter (HOSPITAL_COMMUNITY): Payer: Self-pay | Admitting: Licensed Clinical Social Worker

## 2024-06-17 ENCOUNTER — Ambulatory Visit: Attending: Family Medicine

## 2024-06-17 VITALS — Ht 74.0 in | Wt 250.0 lb

## 2024-06-17 DIAGNOSIS — Z0000 Encounter for general adult medical examination without abnormal findings: Secondary | ICD-10-CM | POA: Diagnosis not present

## 2024-06-17 NOTE — Patient Instructions (Signed)
 Jose Bishop,  Thank you for taking the time for your Medicare Wellness Visit. I appreciate your continued commitment to your health goals. Please review the care plan we discussed, and feel free to reach out if I can assist you further.  Please note that Annual Wellness Visits do not include a physical exam. Some assessments may be limited, especially if the visit was conducted virtually. If needed, we may recommend an in-person follow-up with your provider.  Ongoing Care Seeing your primary care provider every 3 to 6 months helps us  monitor your health and provide consistent, personalized care.   Referrals If a referral was made during today's visit and you haven't received any updates within two weeks, please contact the referred provider directly to check on the status.  Recommended Screenings:  Health Maintenance  Topic Date Due   Zoster (Shingles) Vaccine (1 of 2) Never done   Medicare Annual Wellness Visit  12/06/2023   COVID-19 Vaccine (3 - 2025-26 season) 02/25/2024   Flu Shot  09/23/2024*   Hepatitis B Vaccine (1 of 3 - 19+ 3-dose series) 02/18/2025*   Eye exam for diabetics  09/15/2024   Hemoglobin A1C  10/09/2024   Yearly kidney function blood test for diabetes  11/09/2024   Yearly kidney health urinalysis for diabetes  12/13/2024   Complete foot exam   12/13/2024   Colon Cancer Screening  10/26/2026   DTaP/Tdap/Td vaccine (2 - Td or Tdap) 02/23/2032   Pneumococcal Vaccine for age over 33  Completed   Hepatitis C Screening  Completed   HIV Screening  Completed   HPV Vaccine  Aged Out   Meningitis B Vaccine  Aged Out  *Topic was postponed. The date shown is not the original due date.       06/17/2024    2:18 PM  Advanced Directives  Does Patient Have a Medical Advance Directive? Yes  Type of Advance Directive Living will    Vision: Annual vision screenings are recommended for early detection of glaucoma, cataracts, and diabetic retinopathy. These exams can also  reveal signs of chronic conditions such as diabetes and high blood pressure.  Dental: Annual dental screenings help detect early signs of oral cancer, gum disease, and other conditions linked to overall health, including heart disease and diabetes.  Please see the attached documents for additional preventive care recommendations.

## 2024-06-17 NOTE — Progress Notes (Signed)
 "  Chief Complaint  Patient presents with   Medicare Wellness    SUBSEQUENT     Subjective:   Jose Bishop is a 58 y.o. male who presents for a Medicare Annual Wellness Visit.  Visit info / Clinical Intake: Medicare Wellness Visit Type:: Subsequent Annual Wellness Visit Persons participating in visit and providing information:: patient Medicare Wellness Visit Mode:: Telephone If telephone:: video declined Since this visit was completed virtually, some vitals may be partially provided or unavailable. Missing vitals are due to the limitations of the virtual format.: Documented vitals are patient reported If Telephone or Video please confirm:: I connected with patient using audio/video enable telemedicine. I verified patient identity with two identifiers, discussed telehealth limitations, and patient agreed to proceed. Patient Location:: HOME Provider Location:: HOME OFFICE Interpreter Needed?: No Pre-visit prep was completed: yes AWV questionnaire completed by patient prior to visit?: no Living arrangements:: lives with spouse/significant other Patient's Overall Health Status Rating: (!) fair Typical amount of pain: (!) a lot Does pain affect daily life?: (!) yes Are you currently prescribed opioids?: no  Dietary Habits and Nutritional Risks How many meals a day?: 2 Eats fruit and vegetables daily?: yes Most meals are obtained by: preparing own meals In the last 2 weeks, have you had any of the following?: none Diabetic:: (!) yes Any non-healing wounds?: no How often do you check your BS?: 2 Would you like to be referred to a Nutritionist or for Diabetic Management? : no  Functional Status Activities of Daily Living (to include ambulation/medication): Independent Ambulation: Independent with device- listed below Home Assistive Devices/Equipment: Eyeglasses; Shower/tub chair Medication Administration: Independent Home Management (perform basic housework or laundry):  Independent Manage your own finances?: yes Primary transportation is: driving Concerns about vision?: no *vision screening is required for WTM* Concerns about hearing?: no  Fall Screening Falls in the past year?: 0 Number of falls in past year: 0 Was there an injury with Fall?: 0 Fall Risk Category Calculator: 0 Patient Fall Risk Level: Low Fall Risk  Fall Risk Patient at Risk for Falls Due to: No Fall Risks; Impaired balance/gait Fall risk Follow up: Falls evaluation completed; Education provided  Home and Transportation Safety: All rugs have non-skid backing?: N/A, no rugs All stairs or steps have railings?: yes Grab bars in the bathtub or shower?: (!) no Have non-skid surface in bathtub or shower?: (!) no Good home lighting?: yes Regular seat belt use?: yes Hospital stays in the last year:: no  Cognitive Assessment Difficulty concentrating, remembering, or making decisions? : yes Will 6CIT or Mini Cog be Completed: yes What year is it?: 0 points What month is it?: 0 points Give patient an address phrase to remember (5 components): [name] [address] About what time is it?: 0 points Count backwards from 20 to 1: 0 points Say the months of the year in reverse: 0 points Repeat the address phrase from earlier: 0 points 6 CIT Score: 0 points  Advance Directives (For Healthcare) Does Patient Have a Medical Advance Directive?: Yes Type of Advance Directive: Living will Copy of Living Will in Chart?: No - copy requested  Reviewed/Updated  Reviewed/Updated: Reviewed All (Medical, Surgical, Family, Medications, Allergies, Care Teams, Patient Goals)    Allergies (verified) Bee venom, Lipitor [atorvastatin ], Statins, and Dapagliflozin pro-metformin  er   Current Medications (verified) Outpatient Encounter Medications as of 06/17/2024  Medication Sig   Accu-Chek Softclix Lancets lancets Use as instructed 1x a day   APPLE CIDER VINEGAR PO Take  by mouth.   BLACK  CURRANT SEED OIL PO Take by mouth.   Blood Glucose Monitoring Suppl (ACCU-CHEK GUIDE) w/Device KIT Use as advised   cetirizine  (ZYRTEC ) 10 MG tablet Take 1 tablet (10 mg total) by mouth daily.   CINNAMON PO Take by mouth.   Continuous Glucose Sensor (FREESTYLE LIBRE 3 SENSOR) MISC APPLY EVERY 14 DAYS   Evolocumab  (REPATHA  SURECLICK) 140 MG/ML SOAJ Inject 140 mg into the skin every 14 (fourteen) days.   ezetimibe  (ZETIA ) 10 MG tablet TAKE 1 TABLET(10 MG) BY MOUTH DAILY   fluticasone  (FLONASE ) 50 MCG/ACT nasal spray Place 2 sprays into both nostrils daily.   gabapentin  (NEURONTIN ) 300 MG capsule TAKE 1 CAPSULE(300 MG) BY MOUTH AT BEDTIME   Ginger, Zingiber officinalis, (GINGER PO) Take by mouth.   glipiZIDE  (GLUCOTROL  XL) 5 MG 24 hr tablet Take 1 tablet (5 mg total) by mouth daily with breakfast.   glucose blood test strip Use as instructed 1x a day   ibuprofen  (ADVIL ) 600 MG tablet Take 1 tablet (600 mg total) by mouth every 8 (eight) hours as needed (pain).   Insulin  Lispro-aabc (LYUMJEV  KWIKPEN) 100 UNIT/ML KwikPen Inject 4-8 Units into the skin 3 (three) times daily before meals.   Insulin  Pen Needle 32G X 4 MM MISC Use 1x a day   latanoprost (XALATAN) 0.005 % ophthalmic solution Place 1 drop into both eyes at bedtime. (Patient not taking: Reported on 06/17/2024)   lisinopril  (ZESTRIL ) 20 MG tablet TAKE 1 TABLET(30 MG) BY MOUTH DAILY   metFORMIN  (GLUCOPHAGE -XR) 500 MG 24 hr tablet Take 500 mg by mouth in the morning, at noon, in the evening, and at bedtime.   omeprazole  (PRILOSEC) 40 MG capsule Take 1 capsule (40 mg total) by mouth in the morning.   OVER THE COUNTER MEDICATION BEET SUPPLEMENT   tirzepatide  (MOUNJARO ) 5 MG/0.5ML Pen Inject 5 mg into the skin once a week.   No facility-administered encounter medications on file as of 06/17/2024.    History: Past Medical History:  Diagnosis Date   AAA (abdominal aortic aneurysm)    Anxiety    Bradycardia    Bradycardia    Difficulty  sleeping    Essential hypertension    Family history of stroke    Headache    History of transient ischemic attack (TIA)    15 YRS AGO   Hyperlipidemia    Impotence of organic origin    Liver cyst    Memory loss    Osteoarthritis of acromioclavicular joint    Prostate cancer (HCC)    PVC (premature ventricular contraction)    Type 2 diabetes mellitus with hyperglycemia (HCC)    Past Surgical History:  Procedure Laterality Date   HERNIA REPAIR     X2 ( 1 ING HERNIA / 1 UMBILICAL HERNIA )   LYMPHADENECTOMY Bilateral 01/04/2015   Procedure: BILATERAL LYMPHADENECTOMY;  Surgeon: Gretel Ferrara, MD;  Location: [facility];  Service: Urology;  Laterality: Bilateral;   MASS EXCISION     L UPPER ARM   PENILE PROSTHESIS IMPLANT  03/18/2019   ROBOT ASSISTED LAPAROSCOPIC RADICAL PROSTATECTOMY N/A 01/04/2015   Procedure: ROBOTIC ASSISTED LAPAROSCOPIC RADICAL PROSTATECTOMY LEVEL 2;  Surgeon: Gretel Ferrara, MD;  Location: [facility];  Service: Urology;  Laterality: N/A;   ROTATOR CUFF REPAIR Bilateral    SURGERY SCROTAL / TESTICULAR     Family History  Problem Relation Age of Onset   Anuerysm Mother    Stroke Mother    Hypertension Father  Peripheral vascular disease Father    Stroke Father    Colon cancer Father        96's   Diabetes Sister    Heart murmur Sister    Heart murmur Son    Stomach cancer Neg Hx    Rectal cancer Neg Hx    Throat cancer Neg Hx    Esophageal cancer Neg Hx    Pancreatic cancer Neg Hx    Social History   Occupational History   Occupation: part time  Tobacco Use   Smoking status: Never    Passive exposure: Never   Smokeless tobacco: Never  Vaping Use   Vaping status: Never Used  Substance and Sexual Activity   Alcohol use: Not Currently    Comment: Occasional   Drug use: No   Sexual activity: Yes    Partners: Female   Tobacco Counseling Counseling given: Not Answered  SDOH Screenings   Food Insecurity: No Food Insecurity (06/17/2024)  Housing:  Low Risk (06/17/2024)  Transportation Needs: No Transportation Needs (06/17/2024)  Utilities: Not At Risk (06/17/2024)  Alcohol Screen: Low Risk (12/06/2022)  Depression (PHQ2-9): High Risk (06/17/2024)  Financial Resource Strain: Low Risk (12/06/2022)  Physical Activity: Insufficiently Active (06/17/2024)  Social Connections: Socially Integrated (06/17/2024)  Stress: Stress Concern Present (06/17/2024)  Tobacco Use: Low Risk (06/17/2024)  Health Literacy: Adequate Health Literacy (06/17/2024)   See flowsheets for full screening details  Depression Screen PHQ 2 & 9 Depression Scale- Over the past 2 weeks, how often have you been bothered by any of the following problems? Little interest or pleasure in doing things: 0 Feeling down, depressed, or hopeless (PHQ Adolescent also includes...irritable): 3 PHQ-2 Total Score: 3 Trouble falling or staying asleep, or sleeping too much: 3 (3-4 hours avg) Feeling tired or having little energy: 3 Poor appetite or overeating (PHQ Adolescent also includes...weight loss): 0 (eats well) Feeling bad about yourself - or that you are a failure or have let yourself or your family down: 1 Trouble concentrating on things, such as reading the newspaper or watching television (PHQ Adolescent also includes...like school work): 1 Moving or speaking so slowly that other people could have noticed. Or the opposite - being so fidgety or restless that you have been moving around a lot more than usual: 0 Thoughts that you would be better off dead, or of hurting yourself in some way: 0 PHQ-9 Total Score: 11 If you checked off any problems, how difficult have these problems made it for you to do your work, take care of things at home, or get along with other people?: Somewhat difficult     Goals Addressed             This Visit's Progress    06/17/2024: To get into pain management to get rid of this all over body pain.               Objective:    Today's  Vitals   06/17/24 1413  Weight: 250 lb (113.4 kg)  Height: 6' 2 (1.88 m)  PainSc: 7   PainLoc: Generalized   Body mass index is 32.1 kg/m.  Hearing/Vision screen Hearing Screening - Comments:: Denies hearing difficulties.  Vision Screening - Comments:: Wears reading glasses - up to date with routine eye exams with [facility]  Immunizations and Health Maintenance Health Maintenance  Topic Date Due   Zoster Vaccines- Shingrix (1 of 2) Never done   COVID-19 Vaccine (3 - 2025-26 season) 02/25/2024  Influenza Vaccine  09/23/2024 (Originally 01/25/2024)   Hepatitis B Vaccines 19-59 Average Risk (1 of 3 - 19+ 3-dose series) 02/18/2025 (Originally 09/01/1984)   OPHTHALMOLOGY EXAM  09/15/2024   HEMOGLOBIN A1C  10/09/2024   Diabetic kidney evaluation - eGFR measurement  11/09/2024   Diabetic kidney evaluation - Urine ACR  12/13/2024   FOOT EXAM  12/13/2024   Medicare Annual Wellness (AWV)  06/17/2025   Colonoscopy  10/26/2026   DTaP/Tdap/Td (2 - Td or Tdap) 02/23/2032   Pneumococcal Vaccine: 50+ Years  Completed   Hepatitis C Screening  Completed   HIV Screening  Completed   HPV VACCINES  Aged Out   Meningococcal B Vaccine  Aged Out        Assessment/Plan:  This is a routine wellness examination for Johnathan.  Patient Care Team: Delbert Clam, MD as PCP - General (Family Medicine) Court Dorn PARAS, MD as PCP - Cardiology (Cardiology) Trixie File, MD as Consulting Physician (Internal Medicine) Janit Thresa HERO, DPM as Consulting Physician (Podiatry) Octavia, Charlie Hamilton, MD as Consulting Physician (Ophthalmology) Armbruster, Elspeth SQUIBB, MD as Consulting Physician (Gastroenterology) Bonner Charlie, MD as Consulting Physician (Physical Medicine and Rehabilitation) Renda Glance, MD as Consulting Physician (Urology)  I have personally reviewed and noted the following in the patients chart:   Medical and social history Use of alcohol, tobacco or illicit drugs  Current  medications and supplements including opioid prescriptions. Functional ability and status Nutritional status Physical activity Advanced directives List of other physicians Hospitalizations, surgeries, and ER visits in previous 12 months Vitals Screenings to include cognitive, depression, and falls Referrals and appointments  No orders of the defined types were placed in this encounter.  In addition, I have reviewed and discussed with patient certain preventive protocols, quality metrics, and best practice recommendations. A written personalized care plan for preventive services as well as general preventive health recommendations were provided to patient.   Roz LOISE Fuller, LPN   [id number]   Return in about 1 year (around 06/17/2025) for Medicare wellness.  After Visit Summary: (MyChart) Due to this being a telephonic visit, the after visit summary with patients personalized plan was offered to patient via MyChart   Nurse Notes:   HM Addressed: Vaccines Due: Covid-19, Shingrix, Flu and Hepatitis B   "

## 2024-07-04 ENCOUNTER — Ambulatory Visit: Payer: Self-pay | Admitting: Cardiovascular Disease

## 2024-07-04 ENCOUNTER — Ambulatory Visit (HOSPITAL_COMMUNITY)
Admission: RE | Admit: 2024-07-04 | Discharge: 2024-07-04 | Disposition: A | Source: Ambulatory Visit | Attending: Cardiovascular Disease | Admitting: Cardiovascular Disease

## 2024-07-04 DIAGNOSIS — I7121 Aneurysm of the ascending aorta, without rupture: Secondary | ICD-10-CM | POA: Insufficient documentation

## 2024-07-04 DIAGNOSIS — I10 Essential (primary) hypertension: Secondary | ICD-10-CM | POA: Diagnosis present

## 2024-07-04 DIAGNOSIS — E782 Mixed hyperlipidemia: Secondary | ICD-10-CM | POA: Diagnosis present

## 2024-07-04 DIAGNOSIS — R0789 Other chest pain: Secondary | ICD-10-CM | POA: Diagnosis present

## 2024-07-04 LAB — ECHOCARDIOGRAM COMPLETE
AR max vel: 3.2 cm2
AV Area VTI: 3.48 cm2
AV Area mean vel: 3 cm2
AV Mean grad: 4 mmHg
AV Peak grad: 7.5 mmHg
Ao pk vel: 1.37 m/s
Area-P 1/2: 4.02 cm2
S' Lateral: 2.4 cm

## 2024-08-12 ENCOUNTER — Ambulatory Visit: Admitting: Internal Medicine

## 2024-08-25 ENCOUNTER — Ambulatory Visit: Admitting: Family Medicine
